# Patient Record
Sex: Male | Born: 1960 | Race: Black or African American | Hispanic: No | Marital: Single
Health system: Southern US, Community
[De-identification: ages and names within clinical notes are randomized; demographics above are authoritative.]

## PROBLEM LIST (undated history)

## (undated) DIAGNOSIS — K4090 Unilateral inguinal hernia, without obstruction or gangrene, not specified as recurrent: Secondary | ICD-10-CM

## (undated) DIAGNOSIS — Z21 Asymptomatic human immunodeficiency virus [HIV] infection status: Secondary | ICD-10-CM

## (undated) DIAGNOSIS — B20 Human immunodeficiency virus [HIV] disease: Secondary | ICD-10-CM

---

## 2010-03-12 ENCOUNTER — Emergency Department (HOSPITAL_COMMUNITY): Admission: EM | Admit: 2010-03-12 | Discharge: 2010-03-12 | Payer: Self-pay | Admitting: Emergency Medicine

## 2010-08-15 LAB — URINALYSIS, ROUTINE W REFLEX MICROSCOPIC
Glucose, UA: NEGATIVE mg/dL
Hgb urine dipstick: NEGATIVE
Specific Gravity, Urine: 1.016 (ref 1.005–1.030)
Urobilinogen, UA: 1 mg/dL (ref 0.0–1.0)

## 2010-08-15 LAB — BASIC METABOLIC PANEL
Calcium: 8.8 mg/dL (ref 8.4–10.5)
GFR calc Af Amer: >60 mL/min (ref >60)
GFR calc non Af Amer: >60 mL/min (ref >60)
Sodium: 135 mEq/L (ref 135–145)

## 2010-08-15 LAB — DIFFERENTIAL
Basophils Relative: 0 % (ref 0–1)
Lymphs Abs: 1.4 10*3/uL (ref 0.7–4.0)
Monocytes Relative: 6 % (ref 3–12)
Neutro Abs: 11.9 10*3/uL — ABNORMAL HIGH (ref 1.7–7.7)
Neutrophils Relative %: 83 % — ABNORMAL HIGH (ref 43–77)

## 2010-08-15 LAB — URINE MICROSCOPIC-ADD ON

## 2010-08-15 LAB — CBC
Hemoglobin: 15.9 g/dL (ref 13.0–17.0)
RBC: 5.36 MIL/uL (ref 4.22–5.81)
WBC: 14.4 10*3/uL — ABNORMAL HIGH (ref 4.0–10.5)

## 2010-08-15 LAB — RAPID URINE DRUG SCREEN, HOSP PERFORMED
Opiates: NOT DETECTED
Tetrahydrocannabinol: NOT DETECTED

## 2013-07-06 ENCOUNTER — Encounter (HOSPITAL_COMMUNITY): Payer: Self-pay | Admitting: Emergency Medicine

## 2013-07-06 ENCOUNTER — Emergency Department (HOSPITAL_COMMUNITY)
Admission: EM | Admit: 2013-07-06 | Discharge: 2013-07-06 | Disposition: A | Discharge status: Home / Self Care | Payer: Self-pay | Attending: Emergency Medicine | Admitting: Emergency Medicine

## 2013-07-06 DIAGNOSIS — N390 Urinary tract infection, site not specified: Secondary | ICD-10-CM | POA: Insufficient documentation

## 2013-07-06 DIAGNOSIS — R112 Nausea with vomiting, unspecified: Secondary | ICD-10-CM

## 2013-07-06 DIAGNOSIS — K5289 Other specified noninfective gastroenteritis and colitis: Secondary | ICD-10-CM | POA: Insufficient documentation

## 2013-07-06 DIAGNOSIS — F17200 Nicotine dependence, unspecified, uncomplicated: Secondary | ICD-10-CM | POA: Insufficient documentation

## 2013-07-06 DIAGNOSIS — R55 Syncope and collapse: Secondary | ICD-10-CM | POA: Insufficient documentation

## 2013-07-06 DIAGNOSIS — R197 Diarrhea, unspecified: Secondary | ICD-10-CM

## 2013-07-06 DIAGNOSIS — K529 Noninfective gastroenteritis and colitis, unspecified: Secondary | ICD-10-CM

## 2013-07-06 LAB — CBC WITH DIFFERENTIAL/PLATELET
BASOS ABS: 0 10*3/uL (ref 0.0–0.1)
Basophils Relative: 0 % (ref 0–1)
EOS ABS: 0.1 10*3/uL (ref 0.0–0.7)
EOS PCT: 1 % (ref 0–5)
HCT: 51.5 % (ref 39.0–52.0)
Hemoglobin: 17.7 g/dL — ABNORMAL HIGH (ref 13.0–17.0)
Lymphocytes Relative: 11 % — ABNORMAL LOW (ref 12–46)
Lymphs Abs: 0.9 10*3/uL (ref 0.7–4.0)
MCH: 30 pg (ref 26.0–34.0)
MCHC: 34.4 g/dL (ref 30.0–36.0)
MCV: 87.3 fL (ref 78.0–100.0)
Monocytes Absolute: 0.6 10*3/uL (ref 0.1–1.0)
Monocytes Relative: 7 % (ref 3–12)
NEUTROS PCT: 82 % — AB (ref 43–77)
Neutro Abs: 6.9 10*3/uL (ref 1.7–7.7)
PLATELETS: 302 10*3/uL (ref 150–400)
RBC: 5.9 MIL/uL — ABNORMAL HIGH (ref 4.22–5.81)
RDW: 13.1 % (ref 11.5–15.5)
WBC: 8.5 10*3/uL (ref 4.0–10.5)

## 2013-07-06 LAB — RAPID URINE DRUG SCREEN, HOSP PERFORMED
AMPHETAMINES: NOT DETECTED
BARBITURATES: NOT DETECTED
Benzodiazepines: NOT DETECTED
Cocaine: POSITIVE — AB
OPIATES: NOT DETECTED
Tetrahydrocannabinol: NOT DETECTED

## 2013-07-06 LAB — COMPREHENSIVE METABOLIC PANEL
ALBUMIN: 4.8 g/dL (ref 3.5–5.2)
ALT: 18 U/L (ref 0–53)
AST: 35 U/L (ref 0–37)
Alkaline Phosphatase: 106 U/L (ref 39–117)
BUN: 13 mg/dL (ref 6–23)
CALCIUM: 10.3 mg/dL (ref 8.4–10.5)
CO2: 21 meq/L (ref 19–32)
CREATININE: 1.43 mg/dL — AB (ref 0.50–1.35)
Chloride: 100 mEq/L (ref 96–112)
GFR calc Af Amer: 63 mL/min — ABNORMAL LOW (ref >90)
GFR, EST NON AFRICAN AMERICAN: 55 mL/min — AB (ref >90)
Glucose, Bld: 112 mg/dL — ABNORMAL HIGH (ref 70–99)
Potassium: 4.5 mEq/L (ref 3.7–5.3)
Sodium: 136 mEq/L — ABNORMAL LOW (ref 137–147)
TOTAL PROTEIN: 10 g/dL — AB (ref 6.0–8.3)
Total Bilirubin: 0.7 mg/dL (ref 0.3–1.2)

## 2013-07-06 LAB — URINALYSIS, ROUTINE W REFLEX MICROSCOPIC
Glucose, UA: NEGATIVE mg/dL
Hgb urine dipstick: NEGATIVE
KETONES UR: 15 mg/dL — AB
NITRITE: NEGATIVE
PROTEIN: 100 mg/dL — AB
Specific Gravity, Urine: 1.027 (ref 1.005–1.030)
UROBILINOGEN UA: 0.2 mg/dL (ref 0.0–1.0)
pH: 6 (ref 5.0–8.0)

## 2013-07-06 LAB — URINE MICROSCOPIC-ADD ON

## 2013-07-06 LAB — TROPONIN I: Troponin I: <0.3 ng/mL (ref <0.30)

## 2013-07-06 MED ORDER — ONDANSETRON HCL 8 MG PO TABS: 8.0000 mg | ORAL_TABLET | Freq: Three times a day (TID) | ORAL | Status: DC | PRN

## 2013-07-06 MED ORDER — SODIUM CHLORIDE 0.9 % IV BOLUS (SEPSIS)
1000.0000 mL | Freq: Once | INTRAVENOUS | Status: AC
  Administered 2013-07-06: 1000 mL via INTRAVENOUS

## 2013-07-06 MED ORDER — SODIUM CHLORIDE 0.9 % IV BOLUS (SEPSIS)
500.0000 mL | Freq: Once | INTRAVENOUS | Status: AC
  Administered 2013-07-06: 500 mL via INTRAVENOUS

## 2013-07-06 MED ORDER — CEFTRIAXONE SODIUM 250 MG IJ SOLR
250.0000 mg | Freq: Once | INTRAMUSCULAR | Status: AC
  Administered 2013-07-06: 250 mg via INTRAMUSCULAR
  Filled 2013-07-06: qty 250

## 2013-07-06 MED ORDER — AZITHROMYCIN 250 MG PO TABS
1000.0000 mg | ORAL_TABLET | Freq: Once | ORAL | Status: AC
  Administered 2013-07-06: 1000 mg via ORAL
  Filled 2013-07-06: qty 4

## 2013-07-06 MED ORDER — CEPHALEXIN 500 MG PO CAPS: 500.0000 mg | ORAL_CAPSULE | Freq: Four times a day (QID) | ORAL | Status: DC

## 2013-07-06 NOTE — ED Notes (Signed)
Writer notified pt that urine is needed for samples

## 2013-07-06 NOTE — ED Provider Notes (Addendum)
CSN: [id number]     Arrival date & time 07/06/13  0055 History   First MD Initiated Contact with Patient 07/06/13 0134     Chief Complaint  Patient presents with  . Emesis  . Diarrhea   (Consider location/radiation/quality/duration/timing/severity/associated sxs/prior Treatment) Patient is a 53 y.o. male presenting with vomiting and diarrhea. The history is provided by the patient and a relative.  Emesis Associated symptoms: diarrhea   Associated symptoms: no abdominal pain, no chills, no headaches and no sore throat   Diarrhea Associated symptoms: vomiting   Associated symptoms: no abdominal pain, no chills, no fever and no headaches   pt had c/o nvd illness for past 24 hrs.  States 4-5 episodes nv, emesis clear, not bloody or bilious. States numerous watery stools, not bloody or mucousy. Had gotten up at home tonight to go to kitchen for some water, felt faint/lightheaded when standing, fainting w brief loc in kitchen. Family denies seeing any seizure activity. Pt denies any preceding palpitations or sense of irregular heartbeat. No current or recent chest pain or discomfort of any sort. No sob, unusual doe or fatigue. Pt denies abd pain. No blood loss or melena. No dysuria or gu c/o. Denies injury w fall. No headache. No neck or back pain. No numbness/weakness. No known ill contacts, recent travel, or recent abx use. No new meds or change in meds, no recent abx. No recent travel or known ill contacts.      History reviewed. No pertinent past medical history. History reviewed. No pertinent past surgical history. No family history on file. History  Substance Use Topics  . Smoking status: Current Every Day Smoker  . Smokeless tobacco: Not on file  . Alcohol Use: Yes    Review of Systems  Constitutional: Negative for fever and chills.  HENT: Negative for sore throat.   Eyes: Negative for pain, redness and visual disturbance.  Respiratory: Negative for shortness of breath.    Cardiovascular: Negative for chest pain, palpitations and leg swelling.  Gastrointestinal: Positive for nausea, vomiting and diarrhea. Negative for abdominal pain.  Genitourinary: Negative for dysuria and flank pain.  Musculoskeletal: Negative for back pain and neck pain.  Skin: Negative for rash.  Neurological: Negative for weakness, numbness and headaches.  Hematological: Does not bruise/bleed easily.  Psychiatric/Behavioral: Negative for confusion.    Allergies  Review of patient's allergies indicates no known allergies.  Home Medications  No current outpatient prescriptions on file. BP 108/66  Pulse 66  Temp(Src) 98.1 F (36.7 C) (Oral)  Resp 16  SpO2 100% Physical Exam  Nursing note and vitals reviewed. Constitutional: He is oriented to person, place, and time. He appears well-developed and well-nourished. No distress.  HENT:  Head: Atraumatic.  Nose: Nose normal.  Mouth/Throat: Oropharynx is clear and moist.  No oral or tongue trauma.   Eyes: Conjunctivae are normal. Pupils are equal, round, and reactive to light. No scleral icterus.  Neck: Normal range of motion. Neck supple. No tracheal deviation present. No thyromegaly present.  No bruit  Cardiovascular: Normal rate, regular rhythm, normal heart sounds and intact distal pulses.  Exam reveals no gallop and no friction rub.   No murmur heard. Pulmonary/Chest: Effort normal and breath sounds normal. No accessory muscle usage. No respiratory distress.  Abdominal: Soft. Bowel sounds are normal. He exhibits no distension and no mass. There is no tenderness. There is no rebound and no guarding.  Musculoskeletal: Normal range of motion. He exhibits no edema and no tenderness.  CTLS  spine, non tender, aligned, no step off.   Neurological: He is alert and oriented to person, place, and time.  Motor intact bil, 5/5. sens intact.   Skin: Skin is warm and dry. No rash noted. He is not diaphoretic.  Psychiatric: He has a normal  mood and affect.    ED Course  Procedures (including critical care time)   Results for orders placed during the hospital encounter of 07/06/13  CBC WITH DIFFERENTIAL      Result Value Range   WBC 8.5  4.0 - 10.5 K/uL   RBC 5.90 (*) 4.22 - 5.81 MIL/uL   Hemoglobin 17.7 (*) 13.0 - 17.0 g/dL   HCT 51.5  39.0 - 52.0 %   MCV 87.3  78.0 - 100.0 fL   MCH 30.0  26.0 - 34.0 pg   MCHC 34.4  30.0 - 36.0 g/dL   RDW 13.1  11.5 - 15.5 %   Platelets 302  150 - 400 K/uL   Neutrophils Relative % 82 (*) 43 - 77 %   Neutro Abs 6.9  1.7 - 7.7 K/uL   Lymphocytes Relative 11 (*) 12 - 46 %   Lymphs Abs 0.9  0.7 - 4.0 K/uL   Monocytes Relative 7  3 - 12 %   Monocytes Absolute 0.6  0.1 - 1.0 K/uL   Eosinophils Relative 1  0 - 5 %   Eosinophils Absolute 0.1  0.0 - 0.7 K/uL   Basophils Relative 0  0 - 1 %   Basophils Absolute 0.0  0.0 - 0.1 K/uL  URINALYSIS, ROUTINE W REFLEX MICROSCOPIC      Result Value Range   Color, Urine AMBER (*) YELLOW   APPearance CLOUDY (*) CLEAR   Specific Gravity, Urine 1.027  1.005 - 1.030   pH 6.0  5.0 - 8.0   Glucose, UA NEGATIVE  NEGATIVE mg/dL   Hgb urine dipstick NEGATIVE  NEGATIVE   Bilirubin Urine MODERATE (*) NEGATIVE   Ketones, ur 15 (*) NEGATIVE mg/dL   Protein, ur 100 (*) NEGATIVE mg/dL   Urobilinogen, UA 0.2  0.0 - 1.0 mg/dL   Nitrite NEGATIVE  NEGATIVE   Leukocytes, UA MODERATE (*) NEGATIVE  COMPREHENSIVE METABOLIC PANEL      Result Value Range   Sodium 136 (*) 137 - 147 mEq/L   Potassium 4.5  3.7 - 5.3 mEq/L   Chloride 100  96 - 112 mEq/L   CO2 21  19 - 32 mEq/L   Glucose, Bld 112 (*) 70 - 99 mg/dL   BUN 13  6 - 23 mg/dL   Creatinine, Ser 1.43 (*) 0.50 - 1.35 mg/dL   Calcium 10.3  8.4 - 10.5 mg/dL   Total Protein 10.0 (*) 6.0 - 8.3 g/dL   Albumin 4.8  3.5 - 5.2 g/dL   AST 35  0 - 37 U/L   ALT 18  0 - 53 U/L   Alkaline Phosphatase 106  39 - 117 U/L   Total Bilirubin 0.7  0.3 - 1.2 mg/dL   GFR calc non Af Amer 55 (*) >90 mL/min   GFR calc Af  Amer 63 (*) >90 mL/min  TROPONIN I      Result Value Range   Troponin I <0.30  <0.30 ng/mL  URINE RAPID DRUG SCREEN (HOSP PERFORMED)      Result Value Range   Opiates NONE DETECTED  NONE DETECTED   Cocaine POSITIVE (*) NONE DETECTED   Benzodiazepines NONE DETECTED  NONE DETECTED  Amphetamines NONE DETECTED  NONE DETECTED   Tetrahydrocannabinol NONE DETECTED  NONE DETECTED   Barbiturates NONE DETECTED  NONE DETECTED  URINE MICROSCOPIC-ADD ON      Result Value Range   Squamous Epithelial / LPF FEW (*) RARE   WBC, UA 21-50  <3 WBC/hpf   Bacteria, UA RARE  RARE   Casts HYALINE CASTS (*) NEGATIVE   Urine-Other MUCOUS PRESENT         Date: 07/06/2013  Rate: 67  Rhythm: normal sinus rhythm  QRS Axis: right  Intervals: normal  ST/T Wave abnormalities: nonspecific ST/T changes  Conduction Disutrbances:none  Narrative Interpretation:   Old EKG Reviewed: none available   MDM  Iv ns bolus. zofran iv.  Labs.  Reviewed nursing notes and prior charts for additional history.   ua w 21-50 wbc.   Rocephin. Zithromax.  Urine culture. Will rx keflex at home.  Recheck abd soft nt.   Tolerating po  Fluids.  Recheck no recurrent nvd.  abd soft non tender on recheck.   Pt appears stable for d/c. Will plan recheck in 12-24 hrs if symptoms fail to improve/resolve.      Mirna Mires, MD 07/06/13 [phone]

## 2013-07-06 NOTE — ED Notes (Signed)
Per [facility] pt c/o n/v/d since 1pm yesterday, per family pt passed out but pt states he was asleep; pt was orthostatic prior to arrival.

## 2013-07-06 NOTE — ED Notes (Signed)
Pt c/o dizziness after walking to restroom.  Pt returned to bed by self.  Vitals checked.  MD notified.  No visible change in assessment.  Pt d/c home with instructions to drink plenty of fluid.

## 2013-07-06 NOTE — Discharge Instructions (Signed)
Rest. Drink plenty of fluids. Take zofran as need for nausea. The lab tests show a possible urine infection - take keflex (antibiotic) as prescribed. A urine culture was sent the results of which will be back in 2-3 days - follow up with primary care doctor in the next couple days, and have them follow up onurine culture results at that time.  Return to ER for recheck in 12-24 hours if symptoms fail to improve/resolve. Return to ER right away if worse, abdominal pain, persistent vomiting, other concern.  Do not use cocaine - cocaine use can cause heart disease/heart attacks, strokes, and sudden cardiac death.    Nausea and Vomiting Nausea is a sick feeling that often comes before throwing up (vomiting). Vomiting is a reflex where stomach contents come out of your mouth. Vomiting can cause severe loss of body fluids (dehydration). Children and elderly adults can become dehydrated quickly, especially if they also have diarrhea. Nausea and vomiting are symptoms of a condition or disease. It is important to find the cause of your symptoms. CAUSES   Direct irritation of the stomach lining. This irritation can result from increased acid production (gastroesophageal reflux disease), infection, food poisoning, taking certain medicines (such as nonsteroidal anti-inflammatory drugs), alcohol use, or tobacco use.  Signals from the brain.These signals could be caused by a headache, heat exposure, an inner ear disturbance, increased pressure in the brain from injury, infection, a tumor, or a concussion, pain, emotional stimulus, or metabolic problems.  An obstruction in the gastrointestinal tract (bowel obstruction).  Illnesses such as diabetes, hepatitis, gallbladder problems, appendicitis, kidney problems, cancer, sepsis, atypical symptoms of a heart attack, or eating disorders.  Medical treatments such as chemotherapy and radiation.  Receiving medicine that makes you sleep (general anesthetic) during  surgery. DIAGNOSIS Your caregiver may ask for tests to be done if the problems do not improve after a few days. Tests may also be done if symptoms are severe or if the reason for the nausea and vomiting is not clear. Tests may include:  Urine tests.  Blood tests.  Stool tests.  Cultures (to look for evidence of infection).  X-rays or other imaging studies. Test results can help your caregiver make decisions about treatment or the need for additional tests. TREATMENT You need to stay well hydrated. Drink frequently but in small amounts.You may wish to drink water, sports drinks, clear broth, or eat frozen ice pops or gelatin dessert to help stay hydrated.When you eat, eating slowly may help prevent nausea.There are also some antinausea medicines that may help prevent nausea. HOME CARE INSTRUCTIONS   Take all medicine as directed by your caregiver.  If you do not have an appetite, do not force yourself to eat. However, you must continue to drink fluids.  If you have an appetite, eat a normal diet unless your caregiver tells you differently.  Eat a variety of complex carbohydrates (rice, wheat, potatoes, bread), lean meats, yogurt, fruits, and vegetables.  Avoid high-fat foods because they are more difficult to digest.  Drink enough water and fluids to keep your urine clear or pale yellow.  If you are dehydrated, ask your caregiver for specific rehydration instructions. Signs of dehydration may include:  Severe thirst.  Dry lips and mouth.  Dizziness.  Dark urine.  Decreasing urine frequency and amount.  Confusion.  Rapid breathing or pulse. SEEK IMMEDIATE MEDICAL CARE IF:   You have blood or brown flecks (like coffee grounds) in your vomit.  You have black or bloody  stools.  You have a severe headache or stiff neck.  You are confused.  You have severe abdominal pain.  You have chest pain or trouble breathing.  You do not urinate at least once every 8  hours.  You develop cold or clammy skin.  You continue to vomit for longer than 24 to 48 hours.  You have a fever. MAKE SURE YOU:   Understand these instructions.  Will watch your condition.  Will get help right away if you are not doing well or get worse. Document Released: 05/19/2005 Document Revised: 08/11/2011 Document Reviewed: 10/16/2010 [facility] Patient Information 2014 [place], [place].  Dehydration, Adult Dehydration is when you lose more fluids from the body than you take in. Vital organs like the kidneys, brain, and heart cannot function without a proper amount of fluids and salt. Any loss of fluids from the body can cause dehydration.  CAUSES   Vomiting.  Diarrhea.  Excessive sweating.  Excessive urine output.  Fever. SYMPTOMS  Mild dehydration  Thirst.  Dry lips.  Slightly dry mouth. Moderate dehydration  Very dry mouth.  Sunken eyes.  Skin does not bounce back quickly when lightly pinched and released.  Dark urine and decreased urine production.  Decreased tear production.  Headache. Severe dehydration  Very dry mouth.  Extreme thirst.  Rapid, weak pulse (more than 100 beats per minute at rest).  Cold hands and feet.  Not able to sweat in spite of heat and temperature.  Rapid breathing.  Blue lips.  Confusion and lethargy.  Difficulty being awakened.  Minimal urine production.  No tears. DIAGNOSIS  Your caregiver will diagnose dehydration based on your symptoms and your exam. Blood and urine tests will help confirm the diagnosis. The diagnostic evaluation should also identify the cause of dehydration. TREATMENT  Treatment of mild or moderate dehydration can often be done at home by increasing the amount of fluids that you drink. It is best to drink small amounts of fluid more often. Drinking too much at one time can make vomiting worse. Refer to the home care instructions below. Severe dehydration needs to be treated at the  hospital where you will probably be given intravenous (IV) fluids that contain water and electrolytes. HOME CARE INSTRUCTIONS   Ask your caregiver about specific rehydration instructions.  Drink enough fluids to keep your urine clear or pale yellow.  Drink small amounts frequently if you have nausea and vomiting.  Eat as you normally do.  Avoid:  Foods or drinks high in sugar.  Carbonated drinks.  Juice.  Extremely hot or cold fluids.  Drinks with caffeine.  Fatty, greasy foods.  Alcohol.  Tobacco.  Overeating.  Gelatin desserts.  Wash your hands well to avoid spreading bacteria and viruses.  Only take over-the-counter or prescription medicines for pain, discomfort, or fever as directed by your caregiver.  Ask your caregiver if you should continue all prescribed and over-the-counter medicines.  Keep all follow-up appointments with your caregiver. SEEK MEDICAL CARE IF:  You have abdominal pain and it increases or stays in one area (localizes).  You have a rash, stiff neck, or severe headache.  You are irritable, sleepy, or difficult to awaken.  You are weak, dizzy, or extremely thirsty. SEEK IMMEDIATE MEDICAL CARE IF:   You are unable to keep fluids down or you get worse despite treatment.  You have frequent episodes of vomiting or diarrhea.  You have blood or green matter (bile) in your vomit.  You have blood in your stool  or your stool looks black and tarry.  You have not urinated in 6 to 8 hours, or you have only urinated a small amount of very dark urine.  You have a fever.  You faint. MAKE SURE YOU:   Understand these instructions.  Will watch your condition.  Will get help right away if you are not doing well or get worse. Document Released: 05/19/2005 Document Revised: 08/11/2011 Document Reviewed: 01/06/2011 [facility] Patient Information 2014 [place], [place].   Viral Gastroenteritis Viral gastroenteritis is also known as stomach flu.  This condition affects the stomach and intestinal tract. It can cause sudden diarrhea and vomiting. The illness typically lasts 3 to 8 days. Most people develop an immune response that eventually gets rid of the virus. While this natural response develops, the virus can make you quite ill. CAUSES  Many different viruses can cause gastroenteritis, such as rotavirus or noroviruses. You can catch one of these viruses by consuming contaminated food or water. You may also catch a virus by sharing utensils or other personal items with an infected person or by touching a contaminated surface. SYMPTOMS  The most common symptoms are diarrhea and vomiting. These problems can cause a severe loss of body fluids (dehydration) and a body salt (electrolyte) imbalance. Other symptoms may include:  Fever.  Headache.  Fatigue.  Abdominal pain. DIAGNOSIS  Your caregiver can usually diagnose viral gastroenteritis based on your symptoms and a physical exam. A stool sample may also be taken to test for the presence of viruses or other infections. TREATMENT  This illness typically goes away on its own. Treatments are aimed at rehydration. The most serious cases of viral gastroenteritis involve vomiting so severely that you are not able to keep fluids down. In these cases, fluids must be given through an intravenous line (IV). HOME CARE INSTRUCTIONS   Drink enough fluids to keep your urine clear or pale yellow. Drink small amounts of fluids frequently and increase the amounts as tolerated.  Ask your caregiver for specific rehydration instructions.  Avoid:  Foods high in sugar.  Alcohol.  Carbonated drinks.  Tobacco.  Juice.  Caffeine drinks.  Extremely hot or cold fluids.  Fatty, greasy foods.  Too much intake of anything at one time.  Dairy products until 24 to 48 hours after diarrhea stops.  You may consume probiotics. Probiotics are active cultures of beneficial bacteria. They may lessen the  amount and number of diarrheal stools in adults. Probiotics can be found in yogurt with active cultures and in supplements.  Wash your hands well to avoid spreading the virus.  Only take over-the-counter or prescription medicines for pain, discomfort, or fever as directed by your caregiver. Do not give aspirin to children. Antidiarrheal medicines are not recommended.  Ask your caregiver if you should continue to take your regular prescribed and over-the-counter medicines.  Keep all follow-up appointments as directed by your caregiver. SEEK IMMEDIATE MEDICAL CARE IF:   You are unable to keep fluids down.  You do not urinate at least once every 6 to 8 hours.  You develop shortness of breath.  You notice blood in your stool or vomit. This may look like coffee grounds.  You have abdominal pain that increases or is concentrated in one small area (localized).  You have persistent vomiting or diarrhea.  You have a fever.  The patient is a child younger than 3 months, and he or she has a fever.  The patient is a child older than 3 months, and  he or she has a fever and persistent symptoms.  The patient is a child older than 3 months, and he or she has a fever and symptoms suddenly get worse.  The patient is a baby, and he or she has no tears when crying. MAKE SURE YOU:   Understand these instructions.  Will watch your condition.  Will get help right away if you are not doing well or get worse. Document Released: 05/19/2005 Document Revised: 08/11/2011 Document Reviewed: 03/05/2011 [facility] Patient Information 2014 [place].   Substance Abuse Your exam indicates that you have a problem with substance abuse. Substance abuse is the misuse of alcohol or drugs that causes problems in family life, friendships, and work relationships. Substance abuse is the most important cause of premature illness, disability, and death in our society. It is also the greatest threat to a person's  mental and spiritual well being. Substance abuse can start out in an innocent way, such as social drinking or taking a little extra medication prescribed by your doctor. No one starts out with the intention of becoming an alcoholic or an addict. Substance abuse victims cannot control their use of alcohol or drugs. They may become intoxicated daily or go on weekend binges. Often there is a strong desire to quit, but attempts to stop using often fail. Encounters with law enforcement or conflicts with family members, friends, and work associates are signs of a potential problem. Recovery is always possible, although the craving for some drugs makes it difficult to quit without assistance. Many treatment programs are available to help people stop abusing alcohol or drugs. The first step in treatment is to admit you have a problem. This is a major hurdle because denial is a powerful force with substance abuse. Alcoholics Anonymous, Narcotics Anonymous, Cocaine Anonymous, and other recovery groups and programs can be very useful in helping people to quit. If you do not feel okay about your drug or alcohol use and if it is causing you trouble, we want to encourage you to talk about it with your doctor or with someone from a recovery group who can help you. You could also call the [employer] on Drug Abuse at 1-800-662-HELP. It is up to you to take the first step. [facility] and ALA-TEEN are support groups for friends and family members of an alcohol or drug dependent person. The people who love and care for the alcoholic or addicted person often need help, too. For information about these organizations, check your phone directory or call a local alcohol or drug treatment center. Document Released: 06/26/2004 Document Revised: 08/11/2011 Document Reviewed: 05/20/2008 [facility] Patient Information 2014 [place].    Urinary Tract Infection Urinary tract infections (UTIs) can develop anywhere along your  urinary tract. Your urinary tract is your body's drainage system for removing wastes and extra water. Your urinary tract includes two kidneys, two ureters, a bladder, and a urethra. Your kidneys are a pair of bean-shaped organs. Each kidney is about the size of your fist. They are located below your ribs, one on each side of your spine. CAUSES Infections are caused by microbes, which are microscopic organisms, including fungi, viruses, and bacteria. These organisms are so small that they can only be seen through a microscope. Bacteria are the microbes that most commonly cause UTIs. SYMPTOMS  Symptoms of UTIs may vary by age and gender of the patient and by the location of the infection. Symptoms in young women typically include a frequent and intense urge to  urinate and a painful, burning feeling in the bladder or urethra during urination. Older women and men are more likely to be tired, shaky, and weak and have muscle aches and abdominal pain. A fever may mean the infection is in your kidneys. Other symptoms of a kidney infection include pain in your back or sides below the ribs, nausea, and vomiting. DIAGNOSIS To diagnose a UTI, your caregiver will ask you about your symptoms. Your caregiver also will ask to provide a urine sample. The urine sample will be tested for bacteria and white blood cells. White blood cells are made by your body to help fight infection. TREATMENT  Typically, UTIs can be treated with medication. Because most UTIs are caused by a bacterial infection, they usually can be treated with the use of antibiotics. The choice of antibiotic and length of treatment depend on your symptoms and the type of bacteria causing your infection. HOME CARE INSTRUCTIONS  If you were prescribed antibiotics, take them exactly as your caregiver instructs you. Finish the medication even if you feel better after you have only taken some of the medication.  Drink enough water and fluids to keep your urine  clear or pale yellow.  Avoid caffeine, tea, and carbonated beverages. They tend to irritate your bladder.  Empty your bladder often. Avoid holding urine for long periods of time.  Empty your bladder before and after sexual intercourse.  After a bowel movement, women should cleanse from front to back. Use each tissue only once. SEEK MEDICAL CARE IF:   You have back pain.  You develop a fever.  Your symptoms do not begin to resolve within 3 days. SEEK IMMEDIATE MEDICAL CARE IF:   You have severe back pain or lower abdominal pain.  You develop chills.  You have nausea or vomiting.  You have continued burning or discomfort with urination. MAKE SURE YOU:   Understand these instructions.  Will watch your condition.  Will get help right away if you are not doing well or get worse. Document Released: 02/26/2005 Document Revised: 11/18/2011 Document Reviewed: 06/27/2011 [facility] Patient Information 2014 [place].  Syncope Syncope is a fainting spell. This means the person loses consciousness and drops to the ground. The person is generally unconscious for less than 5 minutes. The person may have some muscle twitches for up to 15 seconds before waking up and returning to normal. Syncope occurs more often in elderly people, but it can happen to anyone. While most causes of syncope are not dangerous, syncope can be a sign of a serious medical problem. It is important to seek medical care.  CAUSES  Syncope is caused by a sudden decrease in blood flow to the brain. The specific cause is often not determined. Factors that can trigger syncope include:  Taking medicines that lower blood pressure.  Sudden changes in posture, such as standing up suddenly.  Taking more medicine than prescribed.  Standing in one place for too long.  Seizure disorders.  Dehydration and excessive exposure to heat.  Low blood sugar (hypoglycemia).  Straining to have a bowel movement.  Heart  disease, irregular heartbeat, or other circulatory problems.  Fear, emotional distress, seeing blood, or severe pain. SYMPTOMS  Right before fainting, you may:  Feel dizzy or lightheaded.  Feel nauseous.  See all white or all black in your field of vision.  Have cold, clammy skin. DIAGNOSIS  Your caregiver will ask about your symptoms, perform a physical exam, and perform electrocardiography (ECG) to record the  electrical activity of your heart. Your caregiver may also perform other heart or blood tests to determine the cause of your syncope. TREATMENT  In most cases, no treatment is needed. Depending on the cause of your syncope, your caregiver may recommend changing or stopping some of your medicines. HOME CARE INSTRUCTIONS  Have someone stay with you until you feel stable.  Do not drive, operate machinery, or play sports until your caregiver says it is okay.  Keep all follow-up appointments as directed by your caregiver.  Lie down right away if you start feeling like you might faint. Breathe deeply and steadily. Wait until all the symptoms have passed.  Drink enough fluids to keep your urine clear or pale yellow.  If you are taking blood pressure or heart medicine, get up slowly, taking several minutes to sit and then stand. This can reduce dizziness. SEEK IMMEDIATE MEDICAL CARE IF:   You have a severe headache.  You have unusual pain in the chest, abdomen, or back.  You are bleeding from the mouth or rectum, or you have black or tarry stool.  You have an irregular or very fast heartbeat.  You have pain with breathing.  You have repeated fainting or seizure-like jerking during an episode.  You faint when sitting or lying down.  You have confusion.  You have difficulty walking.  You have severe weakness.  You have vision problems. If you fainted, call your local emergency services (911 in [place] Do not drive yourself to the hospital.  MAKE SURE  YOU:  Understand these instructions.  Will watch your condition.  Will get help right away if you are not doing well or get worse. Document Released: 05/19/2005 Document Revised: 11/18/2011 Document Reviewed: 07/18/2011 [facility] Patient Information 2014 [place].    Emergency Department Resource Guide 1) Find a Doctor and Pay Out of Pocket Although you won't have to find out who is covered by your insurance plan, it is a good idea to ask around and get recommendations. You will then need to call the office and see if the doctor you have chosen will accept you as a new patient and what types of options they offer for patients who are self-pay. Some doctors offer discounts or will set up payment plans for their patients who do not have insurance, but you will need to ask so you aren't surprised when you get to your appointment.  2) Contact Your Local Health Department Not all health departments have doctors that can see patients for sick visits, but many do, so it is worth a call to see if yours does. If you don't know where your local health department is, you can check in your phone book. The CDC also has a tool to help you locate your state's health department, and many state websites also have listings of all of their local health departments.  3) Find a [facility] If your illness is not likely to be very severe or complicated, you may want to try a walk in clinic. These are popping up all over the country in pharmacies, drugstores, and shopping centers. They're usually staffed by nurse practitioners or physician assistants that have been trained to treat common illnesses and complaints. They're usually fairly quick and inexpensive. However, if you have serious medical issues or chronic medical problems, these are probably not your best option.  No Primary Care Doctor: - Call Health Connect at  [phone] - they can help you locate a primary care doctor that  accepts your insurance,  provides certain services, etc. - Physician Referral Service- [phone]  Chronic Pain Problems: Organization         Address  Phone   Notes  [facility]  [phone] Patients need to be referred by their primary care doctor.   Medication Assistance: Organization         Address  Phone   Notes  [facility] Medication [facility] [address] [place], [place] [address] [phone] --Must be a resident of [facility] -- Must have NO insurance coverage whatsoever (no Medicaid/ Medicare, etc.) -- The pt. MUST have a primary care doctor that directs their care regularly and follows them in the community   [facility]  [phone]   [employer]  [phone]    Agencies that provide inexpensive medical care: Organization         Address  Phone   Notes  [place]  [phone]   [facility]    [phone]   [facility] [place], [place] [address] [phone]   [place] [address] [place] [phone]   [facility]    [phone]   [facility]    [phone]   [place] and [place] [address], [place] Phone:  [phone], Fax:  [phone] Hours of Operation:  9 am - 6 pm, M-F.  Also accepts Medicaid/Medicare and self-pay.  [facility] for [place] [address], [address], [place] Phone: [phone], Fax: [phone]. Hours of Operation:  8:30 am - 5:30 pm, M-F.  Also accepts Medicaid and self-pay.  [facility] [place] [address], [place] Phone: [phone]   [place], [place], [place] [phone], Ext. [phone] Mondays & Thursdays: 7-9 AM.  First 15 patients are seen on a first come, first serve basis.    [place] Providers:  Organization         Address  Phone    Notes  [facility] [address], [place] [phone] Also accepts self-pay patients.  [facility] [address], [place]  [phone]   [place], [address], [place] [phone]   [facility] [address] [place] [phone]   Lucianne Lei [address], [place]   [phone] Only accepts [place] patients after they have their name applied to their card.   Self-Pay (no insurance) in [facility]:  Organization         Address  Phone   Notes  Sickle Cell Patients, [facility] [place] [phone]   [facility] [place] [phone]   [facility] [place]  [place], [place], [place] [phone]   [facility]/Dr. Osei-Bonsu  [address] [place] or [address], [place] [phone] Phone number for both [place] and [place] locations is the same.  [facility] [address], [place] [phone]   [facility] [address] [place] or [address] [phone]   [facility] [phone], phone; [phone], fax Sees patients 1st and 3rd Saturday of every month.  Must not qualify for public or private insurance (i.e. Medicaid, Medicare, [place] Health Choice, Veterans' Benefits)  Household income should be no more than 200% of the poverty level The clinic cannot treat you if you are pregnant or think you are pregnant  Sexually transmitted diseases are not treated at the clinic.    Dental Care: Organization         Address  Phone  Notes  [facility] Department of [facility] [place] [phone] Accepts children up to age 15 who are enrolled in [place] or [place]; pregnant women with a Medicaid card; and children who have applied for Medicaid or [place] Health Choice, but were declined, whose parents can pay a reduced fee at time of service.  [facility] Department of [facility]  [address], [place] [phone] Accepts children up to age 68 who are enrolled in [place] or [place]; pregnant women with a Medicaid card; and children who have applied for Medicaid or [place] Health Choice, but were declined, whose parents can pay a reduced fee at time of service.  [place] [facility]  [place] [phone] Patients are seen by appointment only. Walk-ins are not accepted. [place] will see patients 42 years of age and older. Monday - Tuesday (8am-5pm) Most Wednesdays (8:30-5pm) $30 per visit, cash only  [facility] Adult Dental Access PROGRAM  [address], [facility] [phone] Patients are seen by appointment only. Walk-ins are not accepted. [place] will see patients 71 years of age and older. One Wednesday Evening (Monthly: Volunteer Based).  $30 per visit, cash only  [place]  [phone] for adults; Children under age 77, call Graduate Pediatric Dentistry at [phone]. Children aged 39-14, please call [phone] to request a pediatric application.  Dental services are provided in all areas of dental care including fillings, crowns and bridges, complete and partial dentures, implants, gum treatment, root canals, and extractions. Preventive care is also provided. Treatment is provided to both adults and children. Patients are selected via a lottery and there is often a waiting list.   [facility] [address], [place]  [phone] [email or web address]   [facility] [address] [place], [place] [phone], Ext.  123 Second and Fourth Thursday of each month, opens at 6:30 AM; Clinic ends at 9 AM.  Patients are seen on a first-come first-served basis, and a limited number are seen during each clinic.   [facility]  [address] Hillard Danker [place], [place] [phone]   Eligibility Requirements You must have lived in [place], [place], or [place] counties for at least the last three months.   You cannot be eligible for state or federal sponsored [employer], including [employer], [place], or [employer].   You generally cannot be eligible for healthcare insurance through your employer.    How to apply: Eligibility screenings are held every Tuesday and Wednesday afternoon from 1:00 pm until 4:00 pm. You do not need an appointment for the interview!  [facility] [address], [place], [place]   [place]  [place] Department  [place] Department  [place] in the Community: Intensive Outpatient Programs Organization         Address  Phone  Notes  [place]. [address] [place], [place] [phone]   [facility] [address] [place], [place]   [facility] [address] [place], [place]   [place] [address]  [place], [place] or [phone]   Substance Abuse Resources Organization         Address  Phone  Notes  [facility] Services  [phone]   [place]  [phone]   [facility] [place]   Chinita Pester  [phone]   Residential & Outpatient Substance Abuse Program  [phone]   Psychological Services Organization         Address  Phone  Notes  [facility] [place]  [place]  [phone]   [place] [address], [place] or [phone]    Mobile Crisis Teams Organization         Address  Phone  Notes  [facility], Mobile Crisis Care Unit  [phone]   [facility]  [address] [place], [place]   [place] [address] [place] [phone]    Self-Help/Support Groups Organization         Address  Phone             Notes  [place]. of [place] - variety of support groups  [place] Call for more information  Narcotics Anonymous (NA), [facility] [address], [employer] [place]  2 meetings at this location   Special educational needs teacher         Address  Phone  Notes  [facility] [place],    [place]  [phone]   [facility]  [address], [place] [address], [place], [place]   [place], [place] [phone] Admissions: 8am-3pm M-F  Incentives Substance [place] [address]    [place], [place] [phone]   [facility] [address] [place], [place], [place]   [facility] [address]  [place], [place]   [facility] - Intensive Outpatient [address], [place], [place]   [facility] ([place] [place].,  [place], [place] [phone] or [phone]   [facility] ([facility]) [address] [place], [place] Accepts Medicaid  [facility] [place] [address],  [place] [phone] Substance Abuse/Addiction Treatment   [facility] Organization         Address  Phone  Notes  [facility]  [phone]   Domenic Schwab, PhD [address] Arlis Porta [place], [place]   [phone] or [phone]   [place], [place] [phone]   [place] [address], [place], [place] [phone] Insurance/Medicaid/sponsorship through [employer] and Families [address], [place]                                    [place], [place] [phone] [place] [phone]  [address], [place] [phone]    Dr. Adele Schilder  [phone]   [facility] Dept. 1) [address] [place] 2) [place] 3)  [place] [address], [place] [phone]  [phone]   [place] [phone] or [phone] (After Hours)

## 2013-07-06 NOTE — ED Notes (Signed)
Bed: WA07 Expected date:  Expected time:  Means of arrival:  Comments: EMS/53 yo male with nausea and vomiting since 1300

## 2013-07-07 LAB — URINE CULTURE: Colony Count: 40000

## 2018-02-17 ENCOUNTER — Observation Stay (HOSPITAL_COMMUNITY): Payer: Self-pay | Admitting: Anesthesiology

## 2018-02-17 ENCOUNTER — Encounter (HOSPITAL_COMMUNITY)
Admission: EM | Disposition: A | Discharge status: Home / Self Care | Payer: Self-pay | Source: Home / Self Care | Attending: Emergency Medicine

## 2018-02-17 ENCOUNTER — Encounter (HOSPITAL_COMMUNITY): Payer: Self-pay

## 2018-02-17 ENCOUNTER — Emergency Department (HOSPITAL_COMMUNITY): Payer: Self-pay

## 2018-02-17 ENCOUNTER — Observation Stay (HOSPITAL_COMMUNITY)
Admission: EM | Admit: 2018-02-17 | Discharge: 2018-02-18 | Disposition: A | Discharge status: Home / Self Care | Payer: Self-pay | Attending: Surgery | Admitting: Surgery

## 2018-02-17 DIAGNOSIS — Z79899 Other long term (current) drug therapy: Secondary | ICD-10-CM | POA: Insufficient documentation

## 2018-02-17 DIAGNOSIS — K4090 Unilateral inguinal hernia, without obstruction or gangrene, not specified as recurrent: Secondary | ICD-10-CM

## 2018-02-17 DIAGNOSIS — K4030 Unilateral inguinal hernia, with obstruction, without gangrene, not specified as recurrent: Principal | ICD-10-CM | POA: Insufficient documentation

## 2018-02-17 DIAGNOSIS — F17210 Nicotine dependence, cigarettes, uncomplicated: Secondary | ICD-10-CM | POA: Insufficient documentation

## 2018-02-17 HISTORY — DX: Unilateral inguinal hernia, without obstruction or gangrene, not specified as recurrent: K40.90

## 2018-02-17 HISTORY — PX: INGUINAL HERNIA REPAIR: SHX194

## 2018-02-17 LAB — TYPE AND SCREEN
ABO/RH(D): O POS
Antibody Screen: NEGATIVE

## 2018-02-17 LAB — CBC WITH DIFFERENTIAL/PLATELET
BLASTS: 0 %
Band Neutrophils: 0 %
Basophils Absolute: 0.1 10*3/uL (ref 0.0–0.1)
Basophils Relative: 1 %
EOS ABS: 0.1 10*3/uL (ref 0.0–0.7)
Eosinophils Relative: 1 %
HEMATOCRIT: 46.8 % (ref 39.0–52.0)
Hemoglobin: 14.7 g/dL (ref 13.0–17.0)
LYMPHS ABS: 3 10*3/uL (ref 0.7–4.0)
Lymphocytes Relative: 41 %
MCH: 27.3 pg (ref 26.0–34.0)
MCHC: 31.4 g/dL (ref 30.0–36.0)
MCV: 87 fL (ref 78.0–100.0)
METAMYELOCYTES PCT: 0 %
MYELOCYTES: 0 %
Monocytes Absolute: 0.9 10*3/uL (ref 0.1–1.0)
Monocytes Relative: 12 %
Neutro Abs: 3.1 10*3/uL (ref 1.7–7.7)
Neutrophils Relative %: 45 %
Other: 0 %
PLATELETS: 302 10*3/uL (ref 150–400)
Promyelocytes Relative: 0 %
RBC: 5.38 MIL/uL (ref 4.22–5.81)
RDW: 13.4 % (ref 11.5–15.5)
WBC: 7.2 10*3/uL (ref 4.0–10.5)
nRBC: 0 /100 WBC

## 2018-02-17 LAB — COMPREHENSIVE METABOLIC PANEL
ALBUMIN: 3.6 g/dL (ref 3.5–5.0)
ALT: 11 U/L (ref 0–44)
AST: 22 U/L (ref 15–41)
Alkaline Phosphatase: 123 U/L (ref 38–126)
Anion gap: 9 (ref 5–15)
BILIRUBIN TOTAL: 0.8 mg/dL (ref 0.3–1.2)
BUN: 11 mg/dL (ref 6–20)
CO2: 25 mmol/L (ref 22–32)
Calcium: 9 mg/dL (ref 8.9–10.3)
Chloride: 104 mmol/L (ref 98–111)
Creatinine, Ser: 1.24 mg/dL (ref 0.61–1.24)
GFR calc Af Amer: >60 mL/min (ref >60)
GFR calc non Af Amer: >60 mL/min (ref >60)
Glucose, Bld: 126 mg/dL — ABNORMAL HIGH (ref 70–99)
Potassium: 3.8 mmol/L (ref 3.5–5.1)
SODIUM: 138 mmol/L (ref 135–145)
Total Protein: 7.9 g/dL (ref 6.5–8.1)

## 2018-02-17 LAB — PROTIME-INR
INR: 1.07
Prothrombin Time: 13.8 seconds (ref 11.4–15.2)

## 2018-02-17 LAB — I-STAT CHEM 8, ED
BUN: 15 mg/dL (ref 6–20)
CALCIUM ION: 1.1 mmol/L — AB (ref 1.15–1.40)
Chloride: 103 mmol/L (ref 98–111)
Creatinine, Ser: 1.3 mg/dL — ABNORMAL HIGH (ref 0.61–1.24)
Glucose, Bld: 122 mg/dL — ABNORMAL HIGH (ref 70–99)
HCT: 47 % (ref 39.0–52.0)
Hemoglobin: 16 g/dL (ref 13.0–17.0)
POTASSIUM: 3.8 mmol/L (ref 3.5–5.1)
Sodium: 139 mmol/L (ref 135–145)
TCO2: 27 mmol/L (ref 22–32)

## 2018-02-17 LAB — APTT: aPTT: 30 seconds (ref 24–36)

## 2018-02-17 LAB — ABO/RH: ABO/RH(D): O POS

## 2018-02-17 LAB — LIPASE, BLOOD: Lipase: 23 U/L (ref 11–51)

## 2018-02-17 LAB — I-STAT CG4 LACTIC ACID, ED: Lactic Acid, Venous: 1.39 mmol/L (ref 0.5–1.9)

## 2018-02-17 SURGERY — REPAIR, HERNIA, INGUINAL, INCARCERATED
Anesthesia: General | Site: Groin | Laterality: Right

## 2018-02-17 MED ORDER — LACTATED RINGERS IV SOLN
INTRAVENOUS | Status: DC
  Administered 2018-02-17: 21:00:00 via INTRAVENOUS

## 2018-02-17 MED ORDER — OXYCODONE HCL 5 MG PO TABS: 5.0000 mg | ORAL_TABLET | Freq: Once | ORAL | Status: DC | PRN

## 2018-02-17 MED ORDER — LORAZEPAM 1 MG PO TABS: 1.0000 mg | ORAL_TABLET | Freq: Four times a day (QID) | ORAL | Status: DC | PRN

## 2018-02-17 MED ORDER — 0.9 % SODIUM CHLORIDE (POUR BTL) OPTIME
TOPICAL | Status: DC | PRN
  Administered 2018-02-17: 1000 mL

## 2018-02-17 MED ORDER — ONDANSETRON 4 MG PO TBDP: 4.0000 mg | ORAL_TABLET | Freq: Four times a day (QID) | ORAL | Status: DC | PRN

## 2018-02-17 MED ORDER — ROCURONIUM BROMIDE 100 MG/10ML IV SOLN
INTRAVENOUS | Status: DC | PRN
  Administered 2018-02-17: 50 mg via INTRAVENOUS

## 2018-02-17 MED ORDER — LORAZEPAM 2 MG/ML IJ SOLN: 1.0000 mg | Freq: Four times a day (QID) | INTRAMUSCULAR | Status: DC | PRN

## 2018-02-17 MED ORDER — IOHEXOL 300 MG/ML  SOLN
100.0000 mL | Freq: Once | INTRAMUSCULAR | Status: AC | PRN
  Administered 2018-02-17: 100 mL via INTRAVENOUS

## 2018-02-17 MED ORDER — GABAPENTIN 300 MG PO CAPS
300.0000 mg | ORAL_CAPSULE | Freq: Two times a day (BID) | ORAL | Status: DC
  Administered 2018-02-17 – 2018-02-18 (×2): 300 mg via ORAL
  Filled 2018-02-17 (×2): qty 1

## 2018-02-17 MED ORDER — HYDRALAZINE HCL 20 MG/ML IJ SOLN: 10.0000 mg | INTRAMUSCULAR | Status: DC | PRN

## 2018-02-17 MED ORDER — BUPIVACAINE-EPINEPHRINE 0.25% -1:200000 IJ SOLN
INTRAMUSCULAR | Status: DC | PRN
  Administered 2018-02-17: 26 mL

## 2018-02-17 MED ORDER — ONDANSETRON HCL 4 MG/2ML IJ SOLN
INTRAMUSCULAR | Status: DC | PRN
  Administered 2018-02-17: 4 mg via INTRAVENOUS

## 2018-02-17 MED ORDER — BUPIVACAINE-EPINEPHRINE (PF) 0.25% -1:200000 IJ SOLN
INTRAMUSCULAR | Status: AC
  Filled 2018-02-17: qty 30

## 2018-02-17 MED ORDER — FENTANYL CITRATE (PF) 250 MCG/5ML IJ SOLN
INTRAMUSCULAR | Status: AC
  Filled 2018-02-17: qty 5

## 2018-02-17 MED ORDER — MIDAZOLAM HCL 2 MG/2ML IJ SOLN
INTRAMUSCULAR | Status: AC
  Filled 2018-02-17: qty 2

## 2018-02-17 MED ORDER — MIDAZOLAM HCL 5 MG/5ML IJ SOLN
INTRAMUSCULAR | Status: DC | PRN
  Administered 2018-02-17: 2 mg via INTRAVENOUS

## 2018-02-17 MED ORDER — LACTATED RINGERS IV SOLN
INTRAVENOUS | Status: DC
  Administered 2018-02-17 (×3): via INTRAVENOUS

## 2018-02-17 MED ORDER — ONDANSETRON HCL 4 MG/2ML IJ SOLN: 4.0000 mg | Freq: Once | INTRAMUSCULAR | Status: DC | PRN

## 2018-02-17 MED ORDER — VITAMIN B-1 100 MG PO TABS
100.0000 mg | ORAL_TABLET | Freq: Every day | ORAL | Status: DC
  Administered 2018-02-18: 100 mg via ORAL
  Filled 2018-02-17: qty 1

## 2018-02-17 MED ORDER — ACETAMINOPHEN 500 MG PO TABS
1000.0000 mg | ORAL_TABLET | Freq: Four times a day (QID) | ORAL | Status: DC
  Administered 2018-02-17 – 2018-02-18 (×3): 1000 mg via ORAL
  Filled 2018-02-17 (×3): qty 2

## 2018-02-17 MED ORDER — PHENYLEPHRINE HCL 10 MG/ML IJ SOLN
INTRAMUSCULAR | Status: DC | PRN
  Administered 2018-02-17: 80 ug via INTRAVENOUS
  Administered 2018-02-17: 40 ug via INTRAVENOUS

## 2018-02-17 MED ORDER — PHENYLEPHRINE 40 MCG/ML (10ML) SYRINGE FOR IV PUSH (FOR BLOOD PRESSURE SUPPORT)
PREFILLED_SYRINGE | INTRAVENOUS | Status: AC
  Filled 2018-02-17: qty 10

## 2018-02-17 MED ORDER — MEPERIDINE HCL 50 MG/ML IJ SOLN: 6.2500 mg | INTRAMUSCULAR | Status: DC | PRN

## 2018-02-17 MED ORDER — LORAZEPAM 2 MG/ML IJ SOLN: 0.0000 mg | Freq: Four times a day (QID) | INTRAMUSCULAR | Status: DC

## 2018-02-17 MED ORDER — TRAMADOL HCL 50 MG PO TABS: 50.0000 mg | ORAL_TABLET | Freq: Four times a day (QID) | ORAL | Status: DC | PRN

## 2018-02-17 MED ORDER — ONDANSETRON HCL 4 MG/2ML IJ SOLN: 4.0000 mg | Freq: Four times a day (QID) | INTRAMUSCULAR | Status: DC | PRN

## 2018-02-17 MED ORDER — SUGAMMADEX SODIUM 200 MG/2ML IV SOLN
INTRAVENOUS | Status: DC | PRN
  Administered 2018-02-17: 200 mg via INTRAVENOUS

## 2018-02-17 MED ORDER — LIDOCAINE 2% (20 MG/ML) 5 ML SYRINGE
INTRAMUSCULAR | Status: AC
  Filled 2018-02-17: qty 5

## 2018-02-17 MED ORDER — OXYCODONE HCL 5 MG/5ML PO SOLN: 5.0000 mg | Freq: Once | ORAL | Status: DC | PRN

## 2018-02-17 MED ORDER — PROPOFOL 10 MG/ML IV BOLUS
INTRAVENOUS | Status: AC
  Filled 2018-02-17: qty 20

## 2018-02-17 MED ORDER — IBUPROFEN 600 MG PO TABS: 600.0000 mg | ORAL_TABLET | Freq: Four times a day (QID) | ORAL | Status: DC | PRN

## 2018-02-17 MED ORDER — ONDANSETRON HCL 4 MG/2ML IJ SOLN
4.0000 mg | Freq: Once | INTRAMUSCULAR | Status: AC
  Administered 2018-02-17: 4 mg via INTRAVENOUS
  Filled 2018-02-17: qty 2

## 2018-02-17 MED ORDER — ACETAMINOPHEN 325 MG PO TABS: 325.0000 mg | ORAL_TABLET | ORAL | Status: DC | PRN

## 2018-02-17 MED ORDER — DIPHENHYDRAMINE HCL 12.5 MG/5ML PO ELIX: 12.5000 mg | ORAL_SOLUTION | Freq: Four times a day (QID) | ORAL | Status: DC | PRN

## 2018-02-17 MED ORDER — FOLIC ACID 1 MG PO TABS
1.0000 mg | ORAL_TABLET | Freq: Every day | ORAL | Status: DC
  Administered 2018-02-18: 1 mg via ORAL
  Filled 2018-02-17: qty 1

## 2018-02-17 MED ORDER — DIPHENHYDRAMINE HCL 50 MG/ML IJ SOLN: 12.5000 mg | Freq: Four times a day (QID) | INTRAMUSCULAR | Status: DC | PRN

## 2018-02-17 MED ORDER — FENTANYL CITRATE (PF) 100 MCG/2ML IJ SOLN
INTRAMUSCULAR | Status: AC
  Filled 2018-02-17: qty 2

## 2018-02-17 MED ORDER — HEPARIN SODIUM (PORCINE) 5000 UNIT/ML IJ SOLN
5000.0000 [IU] | Freq: Three times a day (TID) | INTRAMUSCULAR | Status: DC
  Administered 2018-02-18: 5000 [IU] via SUBCUTANEOUS
  Filled 2018-02-17: qty 1

## 2018-02-17 MED ORDER — THIAMINE HCL 100 MG/ML IJ SOLN: 100.0000 mg | Freq: Every day | INTRAMUSCULAR | Status: DC

## 2018-02-17 MED ORDER — ADULT MULTIVITAMIN W/MINERALS CH
1.0000 | ORAL_TABLET | Freq: Every day | ORAL | Status: DC
  Administered 2018-02-18: 1 via ORAL
  Filled 2018-02-17: qty 1

## 2018-02-17 MED ORDER — HYDROMORPHONE HCL 1 MG/ML IJ SOLN: 1.0000 mg | INTRAMUSCULAR | Status: DC | PRN

## 2018-02-17 MED ORDER — ONDANSETRON HCL 4 MG/2ML IJ SOLN
INTRAMUSCULAR | Status: AC
  Filled 2018-02-17: qty 4

## 2018-02-17 MED ORDER — FENTANYL CITRATE (PF) 100 MCG/2ML IJ SOLN: 25.0000 ug | INTRAMUSCULAR | Status: DC | PRN

## 2018-02-17 MED ORDER — ROCURONIUM BROMIDE 50 MG/5ML IV SOSY
PREFILLED_SYRINGE | INTRAVENOUS | Status: AC
  Filled 2018-02-17: qty 10

## 2018-02-17 MED ORDER — SODIUM CHLORIDE 0.9 % IV SOLN: INTRAVENOUS | Status: DC

## 2018-02-17 MED ORDER — DOCUSATE SODIUM 100 MG PO CAPS
200.0000 mg | ORAL_CAPSULE | Freq: Two times a day (BID) | ORAL | Status: DC
  Administered 2018-02-17 – 2018-02-18 (×2): 200 mg via ORAL
  Filled 2018-02-17 (×3): qty 2

## 2018-02-17 MED ORDER — LORAZEPAM 2 MG/ML IJ SOLN: 0.0000 mg | Freq: Two times a day (BID) | INTRAMUSCULAR | Status: DC

## 2018-02-17 MED ORDER — HYDROMORPHONE HCL 1 MG/ML IJ SOLN
1.0000 mg | Freq: Once | INTRAMUSCULAR | Status: AC
  Administered 2018-02-17: 1 mg via INTRAVENOUS
  Filled 2018-02-17: qty 1

## 2018-02-17 MED ORDER — SIMETHICONE 80 MG PO CHEW: 40.0000 mg | CHEWABLE_TABLET | Freq: Four times a day (QID) | ORAL | Status: DC | PRN

## 2018-02-17 MED ORDER — PROPOFOL 10 MG/ML IV BOLUS
INTRAVENOUS | Status: DC | PRN
  Administered 2018-02-17: 100 mg via INTRAVENOUS

## 2018-02-17 MED ORDER — LIDOCAINE HCL (CARDIAC) PF 100 MG/5ML IV SOSY
PREFILLED_SYRINGE | INTRAVENOUS | Status: DC | PRN
  Administered 2018-02-17: 100 mg via INTRAVENOUS

## 2018-02-17 MED ORDER — MORPHINE SULFATE (PF) 4 MG/ML IV SOLN
4.0000 mg | Freq: Once | INTRAVENOUS | Status: AC
  Administered 2018-02-17: 4 mg via INTRAVENOUS
  Filled 2018-02-17: qty 1

## 2018-02-17 MED ORDER — ACETAMINOPHEN 160 MG/5ML PO SOLN: 325.0000 mg | ORAL | Status: DC | PRN

## 2018-02-17 MED ORDER — HYDROMORPHONE HCL 1 MG/ML IJ SOLN: 0.5000 mg | INTRAMUSCULAR | Status: DC | PRN

## 2018-02-17 MED ORDER — FENTANYL CITRATE (PF) 100 MCG/2ML IJ SOLN
INTRAMUSCULAR | Status: DC | PRN
  Administered 2018-02-17: 50 ug via INTRAVENOUS
  Administered 2018-02-17: 100 ug via INTRAVENOUS

## 2018-02-17 MED ORDER — HYDROMORPHONE HCL 1 MG/ML IJ SOLN
0.5000 mg | Freq: Once | INTRAMUSCULAR | Status: AC
  Administered 2018-02-17: 0.5 mg via INTRAVENOUS
  Filled 2018-02-17: qty 1

## 2018-02-17 MED ORDER — CEFOTETAN DISODIUM 1 G IJ SOLR
1.0000 g | INTRAMUSCULAR | Status: AC
  Administered 2018-02-17: 1 g via INTRAVENOUS
  Filled 2018-02-17 (×3): qty 1

## 2018-02-17 SURGICAL SUPPLY — 53 items
BLADE CLIPPER SURG (BLADE) IMPLANT
BLADE SURG 10 STRL SS (BLADE) ×2 IMPLANT
BLADE SURG 15 STRL LF DISP TIS (BLADE) ×1 IMPLANT
BLADE SURG 15 STRL SS (BLADE) ×1
CANISTER SUCT 3000ML PPV (MISCELLANEOUS) IMPLANT
CHLORAPREP W/TINT 26ML (MISCELLANEOUS) ×2 IMPLANT
COVER SURGICAL LIGHT HANDLE (MISCELLANEOUS) ×2 IMPLANT
DERMABOND ADVANCED (GAUZE/BANDAGES/DRESSINGS) ×1
DERMABOND ADVANCED .7 DNX12 (GAUZE/BANDAGES/DRESSINGS) ×1 IMPLANT
DRAIN PENROSE 1/2X12 LTX STRL (WOUND CARE) ×2 IMPLANT
DRAPE LAPAROTOMY TRNSV 102X78 (DRAPE) ×2 IMPLANT
DRAPE UTILITY XL STRL (DRAPES) ×2 IMPLANT
ELECT CAUTERY BLADE 6.4 (BLADE) ×2 IMPLANT
ELECT REM PT RETURN 9FT ADLT (ELECTROSURGICAL) ×2
ELECTRODE REM PT RTRN 9FT ADLT (ELECTROSURGICAL) ×1 IMPLANT
GLOVE BIO SURGEON STRL SZ8 (GLOVE) ×2 IMPLANT
GLOVE BIOGEL PI IND STRL 7.5 (GLOVE) ×1 IMPLANT
GLOVE BIOGEL PI IND STRL 8 (GLOVE) ×1 IMPLANT
GLOVE BIOGEL PI INDICATOR 7.5 (GLOVE) ×1
GLOVE BIOGEL PI INDICATOR 8 (GLOVE) ×1
GLOVE SURG SS PI 7.5 STRL IVOR (GLOVE) ×2 IMPLANT
GOWN STRL REUS W/ TWL LRG LVL3 (GOWN DISPOSABLE) ×1 IMPLANT
GOWN STRL REUS W/ TWL XL LVL3 (GOWN DISPOSABLE) ×1 IMPLANT
GOWN STRL REUS W/TWL LRG LVL3 (GOWN DISPOSABLE) ×1
GOWN STRL REUS W/TWL XL LVL3 (GOWN DISPOSABLE) ×1
KIT BASIN OR (CUSTOM PROCEDURE TRAY) ×2 IMPLANT
KIT TURNOVER KIT B (KITS) ×2 IMPLANT
MESH ULTRAPRO 6X6 15CM15CM (Mesh General) ×2 IMPLANT
NEEDLE HYPO 25GX1X1/2 BEV (NEEDLE) ×2 IMPLANT
NS IRRIG 1000ML POUR BTL (IV SOLUTION) ×2 IMPLANT
PACK SURGICAL SETUP 50X90 (CUSTOM PROCEDURE TRAY) ×2 IMPLANT
PAD ARMBOARD 7.5X6 YLW CONV (MISCELLANEOUS) ×2 IMPLANT
PENCIL BUTTON HOLSTER BLD 10FT (ELECTRODE) ×2 IMPLANT
SPONGE LAP 18X18 X RAY DECT (DISPOSABLE) ×2 IMPLANT
SUT ETHIBOND 0 MO6 C/R (SUTURE) ×4 IMPLANT
SUT MNCRL AB 4-0 PS2 18 (SUTURE) ×2 IMPLANT
SUT NOVA NAB DX-16 0-1 5-0 T12 (SUTURE) ×4 IMPLANT
SUT SILK 2 0 SH (SUTURE) ×4 IMPLANT
SUT VIC AB 0 CT1 27 (SUTURE)
SUT VIC AB 0 CT1 27XBRD ANBCTR (SUTURE) IMPLANT
SUT VIC AB 2-0 SH 27 (SUTURE) ×3
SUT VIC AB 2-0 SH 27X BRD (SUTURE) ×3 IMPLANT
SUT VIC AB 3-0 SH 18 (SUTURE) ×4 IMPLANT
SUT VIC AB 3-0 SH 27 (SUTURE) ×1
SUT VIC AB 3-0 SH 27X BRD (SUTURE) ×1 IMPLANT
SUT VICRYL AB 3 0 TIES (SUTURE) ×2 IMPLANT
SYR BULB 3OZ (MISCELLANEOUS) ×2 IMPLANT
SYR CONTROL 10ML LL (SYRINGE) ×2 IMPLANT
TOWEL GREEN STERILE (TOWEL DISPOSABLE) ×2 IMPLANT
TROCAR XCEL NON-BLD 5MMX100MML (ENDOMECHANICALS) ×2 IMPLANT
TUBE CONNECTING 12X1/4 (SUCTIONS) ×2 IMPLANT
TUBING INSUFFLATION (TUBING) ×2 IMPLANT
YANKAUER SUCT BULB TIP NO VENT (SUCTIONS) IMPLANT

## 2018-02-17 NOTE — ED Notes (Signed)
Pt informed he needs to provide a UA. Given urinal / verbalized understanding.

## 2018-02-17 NOTE — Anesthesia Postprocedure Evaluation (Signed)
Anesthesia Post Note  Patient: Steven Johnston  Procedure(s) Performed: OPEN HERNIA REPAIR RIGHT INGUINAL INCARCERATED WITH DIAGONSTIC LAPAROSCOPY FOR INCARCERATED HERNIA (Right Groin)     Patient location during evaluation: PACU Anesthesia Type: General Level of consciousness: awake and alert Pain management: pain level controlled Vital Signs Assessment: post-procedure vital signs reviewed and stable Respiratory status: spontaneous breathing, nonlabored ventilation, respiratory function stable and patient connected to nasal cannula oxygen Cardiovascular status: blood pressure returned to baseline and stable Postop Assessment: no apparent nausea or vomiting Anesthetic complications: no    Last Vitals:  Vitals:   02/17/18 2000 02/17/18 2015  BP: 134/89 134/89  Pulse: 76 73  Resp: 13 12  Temp:    SpO2: 96% 96%    Last Pain:  Vitals:   02/17/18 2015  TempSrc:   PainSc: Noma DAVID

## 2018-02-17 NOTE — Anesthesia Preprocedure Evaluation (Addendum)
Anesthesia Evaluation  Patient identified by MRN, date of birth, ID band Patient awake    Reviewed: Allergy & Precautions, H&P , NPO status , Patient's Chart, lab work & pertinent test results, reviewed documented beta blocker date and time   Airway Mallampati: II  TM Distance: >3 FB Neck ROM: full    Dental no notable dental hx. (+) Poor Dentition, Chipped, Missing, Loose, Dental Advisory Given,    Pulmonary Current Smoker,    Pulmonary exam normal breath sounds clear to auscultation       Cardiovascular Exercise Tolerance: Good negative cardio ROS   Rhythm:regular Rate:Normal     Neuro/Psych negative neurological ROS  negative psych ROS   GI/Hepatic negative GI ROS, (+)     substance abuse  cocaine use, COCAINE USE YESTERDAY AND TWICE PER WEEK   Endo/Other  negative endocrine ROS  Renal/GU negative Renal ROS  negative genitourinary   Musculoskeletal   Abdominal   Peds  Hematology negative hematology ROS (+)   Anesthesia Other Findings   Reproductive/Obstetrics negative OB ROS                           Anesthesia Physical Anesthesia Plan  ASA: III and emergent  Anesthesia Plan: General   Post-op Pain Management:    Induction: Intravenous  PONV Risk Score and Plan: 2 and Ondansetron, Dexamethasone and Treatment may vary due to age or medical condition  Airway Management Planned: Oral ETT  Additional Equipment:   Intra-op Plan:   Post-operative Plan: Extubation in OR  Informed Consent: I have reviewed the patients History and Physical, chart, labs and discussed the procedure including the risks, benefits and alternatives for the proposed anesthesia with the patient or authorized representative who has indicated his/her understanding and acceptance.   Dental Advisory Given and Dental advisory given  Plan Discussed with: CRNA, Anesthesiologist and Surgeon  Anesthesia Plan  Comments: (  )       Anesthesia Quick Evaluation

## 2018-02-17 NOTE — ED Provider Notes (Signed)
Patient placed in Quick Look pathway, seen and evaluated   Chief Complaint: Hernia pain  HPI: Patient has a history of a reducible right inguinal hernia. Today he woke up and is not able to reduce it and has significant pain, vomiting, breaking out into a sweat. Is not sure if he is able to pass gas. No prior hernia surgeries.   ROS: +vomiting  Physical Exam:   Gen: Vomiting, appears anxious  Neuro: Awake and Alert  Skin: Warm    Focused Exam: Right incarcer inguinal hernia which is large, hard and does not reduce.   Initiation of care has begun. The patient has been counseled on the process, plan, and necessity for staying for the completion/evaluation, and the remainder of the medical screening examination    Bernarda Caffey 02/17/18 1317    Fredia Sorrow, MD 02/17/18 [phone]

## 2018-02-17 NOTE — ED Triage Notes (Signed)
Pt presents for evaluation of R hernia to inguinal area. Vomiting, chills and pain started today. Hx of hernia to same area, states it has worsened.

## 2018-02-17 NOTE — ED Provider Notes (Signed)
[facility] EMERGENCY DEPARTMENT Provider Note   CSN: [id number] Arrival date & time: 02/17/18  1254     History   Chief Complaint Chief Complaint  Patient presents with  . Hernia    HPI Steven Johnston is a 57 y.o. male.  HPI Patient presents with nausea vomiting abdominal pain.  Has had an inguinal hernia on the right side for years.  States for the last year it is not reduced states he is to be able to put it in.  Now began to have nausea and vomiting this morning.  States he woke into a sweat.  Did have a bowel movement earlier today.  States his abdomen does feel larger.  Not had a frank fever.  He has not eaten today. Past Medical History:  Diagnosis Date  . Hernia, inguinal, right     Patient Active Problem List   Diagnosis Date Noted  . Incarcerated right inguinal hernia 02/17/2018  . Incarcerated inguinal hernia 02/17/2018    History reviewed. No pertinent surgical history.      Home Medications    Prior to Admission medications   Medication Sig Start Date End Date Taking? Authorizing Provider  cephALEXin (KEFLEX) 500 MG capsule Take 1 capsule (500 mg total) by mouth 4 (four) times daily. 07/06/13   Lajean Saver, MD  ondansetron (ZOFRAN) 8 MG tablet Take 1 tablet (8 mg total) by mouth every 8 (eight) hours as needed for nausea or vomiting. 07/06/13   Lajean Saver, MD    Family History History reviewed. No pertinent family history.  Social History Social History   Tobacco Use  . Smoking status: Current Every Day Smoker  Substance Use Topics  . Alcohol use: Yes  . Drug use: No     Allergies   Patient has no known allergies.   Review of Systems Review of Systems  Constitutional: Positive for appetite change and diaphoresis.  HENT: Negative for congestion.   Respiratory: Negative for shortness of breath.   Cardiovascular: Negative for chest pain.  Gastrointestinal: Positive for abdominal pain, nausea and vomiting. Negative for  constipation.  Genitourinary: Negative for flank pain.  Musculoskeletal: Negative for back pain.  Skin: Negative for rash.  Neurological: Negative for seizures.  Psychiatric/Behavioral: Negative for confusion.     Physical Exam Updated Vital Signs BP 130/83 (BP Location: Left Arm)   Pulse 76   Temp 98.5 F (36.9 C) (Oral)   Resp 14   Ht 5' 9.75" (1.772 m)   Wt 68 kg   SpO2 99%   BMI 21.68 kg/m   Physical Exam  Constitutional: He appears well-developed.  HENT:  Head: Normocephalic.  Eyes: Pupils are equal, round, and reactive to light.  Neck: Neck supple.  Cardiovascular: Normal rate.  Pulmonary/Chest: He has no rales.  Abdominal: He exhibits distension. There is tenderness. A hernia is present.  Right-sided inguinal hernia.  Goes down into right hemiscrotum.  Somewhat tender.  Large and not reducible.  Musculoskeletal: He exhibits no tenderness.  Neurological: He is alert.  Skin: Skin is warm. Capillary refill takes less than 2 seconds.     ED Treatments / Results  Labs (all labs ordered are listed, but only abnormal results are displayed) Labs Reviewed  COMPREHENSIVE METABOLIC PANEL - Abnormal; Notable for the following components:      Result Value   Glucose, Bld 126 (*)    All other components within normal limits  I-STAT CHEM 8, ED - Abnormal; Notable for the following components:  Creatinine, Ser 1.30 (*)    Glucose, Bld 122 (*)    Calcium, Ion 1.10 (*)    All other components within normal limits  CBC WITH DIFFERENTIAL/PLATELET  LIPASE, BLOOD  APTT  PROTIME-INR  URINALYSIS, ROUTINE W REFLEX MICROSCOPIC  RAPID URINE DRUG SCREEN, HOSP PERFORMED  HIV ANTIBODY (ROUTINE TESTING W REFLEX)  BASIC METABOLIC PANEL  CBC  I-STAT CG4 LACTIC ACID, ED  I-STAT CG4 LACTIC ACID, ED  TYPE AND SCREEN  ABO/RH    EKG None  Radiology Ct Abdomen Pelvis W Contrast  Result Date: 02/17/2018 CLINICAL DATA:  History of reducible right inguinal hernia. New onset of  pain, vomiting and sweating. EXAM: CT ABDOMEN AND PELVIS WITH CONTRAST TECHNIQUE: Multidetector CT imaging of the abdomen and pelvis was performed using the standard protocol following bolus administration of intravenous contrast. CONTRAST:  153mL OMNIPAQUE IOHEXOL 300 MG/ML  SOLN COMPARISON:  None. FINDINGS: Lower chest: Clear lung bases. Normal heart size without pericardial or pleural effusion. Hepatobiliary: Normal liver. Normal gallbladder, without biliary ductal dilatation. Pancreas: Normal, without mass or ductal dilatation. Spleen: Normal in size, without focal abnormality. Adrenals/Urinary Tract: Normal adrenal glands. Possible punctate left renal collecting system calculi versus early contrast excretion. No hydronephrosis. Normal urinary bladder. Stomach/Bowel: Normal stomach, without wall thickening. Normal colon and appendix. The proximal small bowel loops are normal in caliber. A right inguinal hernia contains distal small bowel including distal ileum. There is edema within the herniated small bowel, as well as within a short segment of distal ileum just proximal to the hernia. This edematous bowel is hypoenhancing to nonenhancing, highly suspicious for ischemia. No pneumatosis or free intraperitoneal air. Vascular/Lymphatic: Normal caliber of the aorta and branch vessels. No abdominopelvic adenopathy. Reproductive: Normal prostate. Other: No significant free fluid. Musculoskeletal: No acute osseous abnormality. IMPRESSION: 1. Right inguinal hernia containing distal small bowel, with edema within the herniated bowel and adjacent distal ileum, suspicious for strangulation and complicating ischemia. 2. No bowel obstruction. 3. Possible left nephrolithiasis. These results were called by telephone at the time of interpretation on 02/17/2018 at 2:42 pm to Dr. Alvino Chapel , who verbally acknowledged these results. Electronically Signed   By: Abigail Miyamoto M.D.   On: 02/17/2018 14:50    Procedures Procedures  (including critical care time)  Medications Ordered in ED Medications  0.9 %  sodium chloride infusion ( Intravenous Stopped 02/17/18 2113)  ondansetron (ZOFRAN-ODT) disintegrating tablet 4 mg ( Oral MAR Unhold 02/17/18 2039)    Or  ondansetron (ZOFRAN) injection 4 mg ( Intravenous MAR Unhold 02/17/18 2039)  LORazepam (ATIVAN) tablet 1 mg ( Oral MAR Unhold 02/17/18 2039)    Or  LORazepam (ATIVAN) injection 1 mg ( Intravenous MAR Unhold 02/17/18 2039)  thiamine (VITAMIN B-1) tablet 100 mg ( Oral MAR Unhold 02/17/18 2039)    Or  thiamine (B-1) injection 100 mg ( Intravenous MAR Unhold 9/79/89 2119)  folic acid (FOLVITE) tablet 1 mg ( Oral MAR Unhold 02/17/18 2039)  multivitamin with minerals tablet 1 tablet ( Oral MAR Unhold 02/17/18 2039)  LORazepam (ATIVAN) injection 0-4 mg (0 mg Intravenous Not Given 02/17/18 2222)    Followed by  LORazepam (ATIVAN) injection 0-4 mg ( Intravenous MAR Unhold 02/17/18 2039)  lactated ringers infusion ( Intravenous Anesthesia Volume Adjustment 02/17/18 1942)  heparin injection 5,000 Units (5,000 Units Subcutaneous Not Given 02/17/18 2221)  lactated ringers infusion ( Intravenous New Bag/Given 02/17/18 2120)  diphenhydrAMINE (BENADRYL) 12.5 MG/5ML elixir 12.5 mg (has no administration in time range)  Or  diphenhydrAMINE (BENADRYL) injection 12.5 mg (has no administration in time range)  gabapentin (NEURONTIN) capsule 300 mg (300 mg Oral Given 02/17/18 2112)  ondansetron (ZOFRAN-ODT) disintegrating tablet 4 mg (has no administration in time range)    Or  ondansetron (ZOFRAN) injection 4 mg (has no administration in time range)  docusate sodium (COLACE) capsule 200 mg (200 mg Oral Given 02/17/18 2112)  simethicone (MYLICON) chewable tablet 40 mg (has no administration in time range)  hydrALAZINE (APRESOLINE) injection 10 mg (has no administration in time range)  acetaminophen (TYLENOL) tablet 1,000 mg (1,000 mg Oral Given 02/17/18 2112)  ibuprofen (ADVIL,MOTRIN) tablet  600 mg (has no administration in time range)  traMADol (ULTRAM) tablet 50 mg (has no administration in time range)  HYDROmorphone (DILAUDID) injection 0.5 mg (has no administration in time range)  ondansetron (ZOFRAN) injection 4 mg (4 mg Intravenous Given 02/17/18 1320)  morphine 4 MG/ML injection 4 mg (4 mg Intravenous Given 02/17/18 1320)  iohexol (OMNIPAQUE) 300 MG/ML solution 100 mL (100 mLs Intravenous Contrast Given 02/17/18 1415)  HYDROmorphone (DILAUDID) injection 0.5 mg (0.5 mg Intravenous Given 02/17/18 1435)  HYDROmorphone (DILAUDID) injection 1 mg (1 mg Intravenous Given 02/17/18 1503)  cefoTEtan (CEFOTAN) 1 g in sodium chloride 0.9 % 100 mL IVPB ( Intravenous MAR Unhold 02/17/18 2039)     Initial Impression / Assessment and Plan / ED Course  I have reviewed the triage vital signs and the nursing notes.  Pertinent labs & imaging results that were available during my care of the patient were reviewed by me and considered in my medical decision making (see chart for details).     Patient with incarcerated right-sided inguinal hernia.  Possible bowel injury on CT scan.  Taken to the OR by general surgery per  Final Clinical Impressions(s) / ED Diagnoses   Final diagnoses:  Right inguinal hernia    ED Discharge Orders    None       Davonna Belling, MD 02/17/18 2234

## 2018-02-17 NOTE — Transfer of Care (Signed)
Immediate Anesthesia Transfer of Care Note  Patient: Steven Johnston  Procedure(s) Performed: OPEN HERNIA REPAIR RIGHT INGUINAL INCARCERATED WITH DIAGONSTIC LAPAROSCOPY FOR INCARCERATED HERNIA (Right Groin)  Patient Location: PACU  Anesthesia Type:General  Level of Consciousness: drowsy  Airway & Oxygen Therapy: Patient Spontanous Breathing and Patient connected to face mask oxygen  Post-op Assessment: Report given to RN and Post -op Vital signs reviewed and stable  Post vital signs: Reviewed and stable  Last Vitals:  Vitals Value Taken Time  BP 149/96 02/17/2018  7:41 PM  Temp    Pulse 92 02/17/2018  7:42 PM  Resp 16 02/17/2018  7:42 PM  SpO2 96 % 02/17/2018  7:42 PM  Vitals shown include unvalidated device data.  Last Pain:  Vitals:   02/17/18 1534  TempSrc:   PainSc: 6          Complications: No apparent anesthesia complications

## 2018-02-17 NOTE — H&P (Addendum)
Steven Johnston is an 57 y.o. male.   Chief Complaint: Right inguinal hernia pain, nausea and vomiting   HPI: pt presented to the ED today with above complaint.  Pt has had this for some time (over a year) and is normally reducible, in the ED they could not reduce it.  It seems like this came out today, but I can't be certain.   Work up shows he is afebrile, VS were stable.  Labs show an elevated creatinine of 1.30, remainder of the CMP was normal.  Lactate 1.39.  WBC 7.2, H/H 14.7/46.8. CT scan shows:  Right inguinal hernia containing distal small bowel, with edema within the herniated bowel and adjacent distal ileum, suspicious for strangulation and complicating ischemia.  No bowel obstruction. Possible left nephrolithiasis.  History reviewed. No pertinent past medical history.  None reported  History reviewed. No pertinent surgical history.  None reported  No family history on file. Social History:  reports that he has been smoking. He does not have any smokeless tobacco history on file. He reports that he drinks alcohol. He reports that he does not use drugs. Tobacco:  < 1 PPD Drugs:  Cocaine last use yesterday ETOH:  + use - at least 1 can of beer per day Lives with mother Works: construction/painting  Allergies: No Known Allergies  Prior to Admission medications   Medication Sig Start Date End Date Taking? Authorizing Provider  cephALEXin (KEFLEX) 500 MG capsule Take 1 capsule (500 mg total) by mouth 4 (four) times daily. 07/06/13   Lajean Saver, MD  ondansetron (ZOFRAN) 8 MG tablet Take 1 tablet (8 mg total) by mouth every 8 (eight) hours as needed for nausea or vomiting. 07/06/13   Lajean Saver, MD     Results for orders placed or performed during the hospital encounter of 02/17/18 (from the past 48 hour(s))  CBC with Differential     Status: None   Collection Time: 02/17/18  1:16 PM  Result Value Ref Range   WBC 7.2 4.0 - 10.5 K/uL   RBC 5.38 4.22 - 5.81 MIL/uL    Hemoglobin 14.7 13.0 - 17.0 g/dL   HCT 46.8 39.0 - 52.0 %   MCV 87.0 78.0 - 100.0 fL   MCH 27.3 26.0 - 34.0 pg   MCHC 31.4 30.0 - 36.0 g/dL   RDW 13.4 11.5 - 15.5 %   Platelets 302 150 - 400 K/uL   Neutrophils Relative % 45 %   Lymphocytes Relative 41 %   Monocytes Relative 12 %   Eosinophils Relative 1 %   Basophils Relative 1 %   Band Neutrophils 0 %   Metamyelocytes Relative 0 %   Myelocytes 0 %   Promyelocytes Relative 0 %   Blasts 0 %   nRBC 0 0 /100 WBC   Other 0 %   Neutro Abs 3.1 1.7 - 7.7 K/uL   Lymphs Abs 3.0 0.7 - 4.0 K/uL   Monocytes Absolute 0.9 0.1 - 1.0 K/uL   Eosinophils Absolute 0.1 0.0 - 0.7 K/uL   Basophils Absolute 0.1 0.0 - 0.1 K/uL   WBC Morphology MILD LEFT SHIFT (1-5% METAS, OCC MYELO, OCC BANDS)     Comment: Performed at [facility], [address], [place], [place] [address]  Comprehensive metabolic panel     Status: Abnormal   Collection Time: 02/17/18  1:16 PM  Result Value Ref Range   Sodium 138 135 - 145 mmol/L   Potassium 3.8 3.5 - 5.1 mmol/L  Chloride 104 98 - 111 mmol/L   CO2 25 22 - 32 mmol/L   Glucose, Bld 126 (H) 70 - 99 mg/dL   BUN 11 6 - 20 mg/dL   Creatinine, Ser 1.24 0.61 - 1.24 mg/dL   Calcium 9.0 8.9 - 10.3 mg/dL   Total Protein 7.9 6.5 - 8.1 g/dL   Albumin 3.6 3.5 - 5.0 g/dL   AST 22 15 - 41 U/L   ALT 11 0 - 44 U/L   Alkaline Phosphatase 123 38 - 126 U/L   Total Bilirubin 0.8 0.3 - 1.2 mg/dL   GFR calc non Af Amer >60 >60 mL/min   GFR calc Af Amer >60 >60 mL/min    Comment: (NOTE) The eGFR has been calculated using the CKD EPI equation. This calculation has not been validated in all clinical situations. eGFR's persistently <60 mL/min signify possible Chronic Kidney Disease.    Anion gap 9 5 - 15    Comment: Performed at [facility] [address] [place], [place] [address]  Lipase, blood     Status: None   Collection Time: 02/17/18  1:16 PM  Result Value Ref Range   Lipase 23 11 - 51 U/L    Comment:  Performed at [facility] [address], [place], [place] [address]  I-Stat Chem 8, ED     Status: Abnormal   Collection Time: 02/17/18  1:24 PM  Result Value Ref Range   Sodium 139 135 - 145 mmol/L   Potassium 3.8 3.5 - 5.1 mmol/L   Chloride 103 98 - 111 mmol/L   BUN 15 6 - 20 mg/dL   Creatinine, Ser 1.30 (H) 0.61 - 1.24 mg/dL   Glucose, Bld 122 (H) 70 - 99 mg/dL   Calcium, Ion 1.10 (L) 1.15 - 1.40 mmol/L   TCO2 27 22 - 32 mmol/L   Hemoglobin 16.0 13.0 - 17.0 g/dL   HCT 47.0 39.0 - 52.0 %  I-Stat CG4 Lactic Acid, ED     Status: None   Collection Time: 02/17/18  1:25 PM  Result Value Ref Range   Lactic Acid, Venous 1.39 0.5 - 1.9 mmol/L   No results found.  Review of Systems  Constitutional: Negative.   HENT: Negative.   Eyes: Negative.   Respiratory: Negative.   Cardiovascular: Negative.   Gastrointestinal: Positive for abdominal pain, nausea and vomiting.  Genitourinary: Negative.        Pain right scrotum  Musculoskeletal: Negative.   Skin: Negative.   Neurological: Negative.   Endo/Heme/Allergies: Negative.   Psychiatric/Behavioral: Negative.     Blood pressure 124/86, pulse 76, temperature 97.6 F (36.4 C), temperature source Oral, resp. rate 20, height 5' 9.75" (1.772 m), weight 68 kg, SpO2 97 %. Physical Exam  Constitutional: He is oriented to person, place, and time. He appears distressed (pain from incarcerated hernia).  Poorly kept male in a lot of pain.   HENT:  Head: Normocephalic and atraumatic.  Mouth/Throat: Oropharynx is clear and moist. No oropharyngeal exudate.  Eyes: Right eye exhibits no discharge. Left eye exhibits no discharge. No scleral icterus.  Equal - just had dilaudid  Neck: Normal range of motion. Neck supple. No JVD present. No tracheal deviation present. No thyromegaly present.  Cardiovascular: Normal rate, regular rhythm, normal heart sounds and intact distal pulses.  No murmur heard. Respiratory: Effort normal and breath  sounds normal. No respiratory distress. He has no wheezes. He has no rales. He exhibits no tenderness.  GI:  He exhibits distension and mass. There is no tenderness. There is no rebound and no guarding.  Genitourinary:  Genitourinary Comments: Large hard right inguinal hernia, filling the scrotum.  It is hard and tender, I could not reduce it even with 1.5 mg IV dilaudid  Musculoskeletal: He exhibits no edema or tenderness.  Lymphadenopathy:    He has no cervical adenopathy.  Neurological: He is alert and oriented to person, place, and time. No cranial nerve deficit.  Skin: Skin is warm and dry. No rash noted. He is not diaphoretic. No erythema. No pallor.  Psychiatric: He has a normal mood and affect. His behavior is normal. Judgment and thought content normal.     Assessment/Plan Incarcerated RIH - possible ischemic bowel Tobacco use ETOH use Cocaine use  Plan:  Dr. Alvino Chapel and I both tried to reduce hernia without success.  Admit, hydrate, plan for surgery tonight.  SIWA protocol. He is posted with the OR for Dr. Brantley Stage or Dr. Dema Severin for tonight.  Kashmere Staffa, PA-C 02/17/2018, 2:34 PM

## 2018-02-17 NOTE — Op Note (Signed)
02/17/2018  7:21 PM  PATIENT:  Steven Johnston  57 y.o. male  Patient Care Team: Patient, No Pcp Per as PCP - General (General Practice)  PRE-OPERATIVE DIAGNOSIS:  Incarcerated right inguinal hernia containing small bowel  POST-OPERATIVE DIAGNOSIS:  Incarcerated right indirect inguinal hernia containing viable small bowel  PROCEDURE:  1. Open right inguinal hernia repair with mesh 2. Diagnostic laparoscopy  SURGEON:  Sharon Mt. Whitaker Holderman, MD  ASSISTANT: Scrub nurse  ANESTHESIA:  General endotracheal  COUNTS:  Sponge, needle and instrument counts were reported correct x2 at the conclusion of the operation.  EBL: 10cc  DRAINS: None  SPECIMEN: None  FINDINGS: Indirect right inguinal hernia - while prepping patient it subsequently reduced. After opening hernia sac and before decision for mesh, a 32mm port was placed inside the hernia sac and diagnostic laparoscopy performed. The loop of distal ileum was identified and clearly viable. There were some subserosal petechiae consistent with incarceration but the loop of small bowel was pink, the mesentery was healthy appearing, as was the cecum. Therefore mesh repair was performed.  INDICATION: Steven Johnston is a pleasant 5yoM with no known past medical hx presented to ED today with 1-2d hx of incarceration of a right groin hernia. He has had this may years but usually reducible, this time it was not. Has had abdominal wall pain; denies pain in the hernia sac. Had n/v on arrival. BM and flatus early this morning.  In the emergency room, he was evaluated by the ED staff and noted to have an incarcerated right inguinal hernia that despite their attempts could not be reduced.  He was afebrile normal vital signs, his labs were normal including his Steven Johnston count and lactate levels.  He underwent a CT scan of the abdomen pelvis which demonstrated a right inguinal hernia containing distal small bowel with edema within the herniated bowel and adjacent  distal ileum.  He was then seen by the surgical team and attempts were made at reduction but none were successful.  Options were discussed with the patient and he opted to pursue an open inguinal hernia repair with possible placement of mesh.  We discussed the potential for laparoscopy/laparotomy if the segment of incarcerated bowel did reduce.  We discussed the possibility of a bowel resection.  Please refer to H&P for details regarding all this discussion.  DESCRIPTION: The patient was identified in preop holding and taken to the OR where they were placed supine on the operating room table and SCDs were placed. General endotracheal anesthesia was induced without difficulty. Hair in the region of the planned incisions was clipped. The patient was then prepped and draped in the usual sterile fashion. A surgical timeout was performed indicating the correct patient, procedure, positioning and need for preoperative antibiotics.  0.25% Marcaine with epinephrine was used to anesthetize the skin over the mid-portion of the inguinal canal. An oblique incision was made. Dissection was carried down through the subcutaneous tissue and Scarpa's fascia with cautery to the external oblique fascia.  The external oblique fascia was opened along the direction of its fibers to the external ring.  The spermatic cord and hernia sac were circumferentially dissected bluntly and retracted with a Penrose drain.  The ilioinguinal nerve was identified.  Ultimately, the ilioinguinal nerve layed directly on top of the mesh that was deployed and given concerns for potential neuroma and chronic pain, the decision was made to divide this nerve, trading potential cutaneous numbness for chronic pain. The floor of the inguinal canal was  inspected and an indirect hernia sac was identified.  The contents of the sac had reduced while the scrub nurse was prepping his groin.  The cord structures were all identified and then dissected. The hernia sac  was identified, care being taken not to skeletonize the cord, this was dissected free from the cord structures.  The hernia sac was quite large and extended all the way down into his scrotum.  For this reason, the entire sac was not resected as it would risk testicular ischemia from extensive skeletonization of the spermatic cord.  Contents were verified to have been reduced and the sac was thin-walled.  The empty sac was then carefully opened sharply at the distal end of the sac where it was thinnest.  Peritoneal entry was apparent.  The patient was then placed in Trendelenburg with right side up.  Through this opening in the peritoneum, a 5 mm laparoscopic trocar was placed and secured with a 2-0 Vicryl suture around the hernia sac.  The peritoneal cavity then insufflated with CO2 to a maximum pressure of 15 mmHg.  The 5 Steven Johnston laparoscope was inserted and inspection revealed the distal ileum lying just beneath the right anal canal.  There was some subserosal petechiae but the bowel was clearly pink and viable.  The mesentery going to the segment of bowel was also viable.  The cecum was viable.  At this point, the laparoscope was removed and the peritoneal cavity desufflated. High ligation of the sac was performed under direct visualization and the sac suture ligated with a 2-0 Vicryl suture.  Given that there were no compromised segments of intestine, the decision was made to repair his hernia with mesh. A piece of light weight prolene mesh was cut to fit the inguinal floor and deployed. The mesh was secured to the pubic tubercle using two 0 Ethibond sutures - ensuring that there was 2cm of overlap between the mesh and the pubic tubercle. The mesh was then secured using additional 0 Ethibond suture to the shelving edge of the inguinal ligament inferiorly and the conjoined tendon superiorly. The mesh tails were tucked underneath the external oblique fascia laterally.  The tails of the mesh were then closed around  the spermatic cord to recreate the internal inguinal ring and this ring was secured using 0 Ethibond suture.  This was made to be just loose enough to accommodate the tip of the finger so as to reduce the risk of spinal cord ischemia.  The inguinal floor was then irrigated with sterile saline.  Hemostasis was then verified.  The external oblique fascia was reapproximated with 2-0 Vicryl.  3-0 Vicryl was then used to close Scarpa's fascia. A running 4-0 Monocryl subcuticular suture was used to close the skin.  Dermabond was the applied. The patient was awakened from general anesthesia, extubated and taken to the recovery in satisfactory condition.  DISPOSITION: PACU in satisfactory condition

## 2018-02-17 NOTE — Anesthesia Procedure Notes (Signed)
Procedure Name: Intubation Date/Time: 02/17/2018 5:53 PM Performed by: Zebulon Gantt T, CRNA Pre-anesthesia Checklist: Patient identified, Emergency Drugs available, Suction available and Patient being monitored Patient Re-evaluated:Patient Re-evaluated prior to induction Oxygen Delivery Method: Circle system utilized Preoxygenation: Pre-oxygenation with 100% oxygen Induction Type: IV induction Ventilation: Mask ventilation without difficulty and Oral airway inserted - appropriate to patient size Laryngoscope Size: Miller and 3 Grade View: Grade I Tube type: Oral Tube size: 7.5 mm Number of attempts: 1 Airway Equipment and Method: Patient positioned with wedge pillow and Stylet Placement Confirmation: ETT inserted through vocal cords under direct vision,  positive ETCO2 and breath sounds checked- equal and bilateral Secured at: 22 cm Tube secured with: Tape Dental Injury: Teeth and Oropharynx as per pre-operative assessment

## 2018-02-18 ENCOUNTER — Encounter (HOSPITAL_COMMUNITY): Payer: Self-pay | Admitting: *Deleted

## 2018-02-18 ENCOUNTER — Other Ambulatory Visit: Payer: Self-pay

## 2018-02-18 LAB — CBC
HCT: 40.1 % (ref 39.0–52.0)
HEMOGLOBIN: 12.8 g/dL — AB (ref 13.0–17.0)
MCH: 27.6 pg (ref 26.0–34.0)
MCHC: 31.9 g/dL (ref 30.0–36.0)
MCV: 86.6 fL (ref 78.0–100.0)
Platelets: 259 10*3/uL (ref 150–400)
RBC: 4.63 MIL/uL (ref 4.22–5.81)
RDW: 13.7 % (ref 11.5–15.5)
WBC: 6.9 10*3/uL (ref 4.0–10.5)

## 2018-02-18 LAB — BASIC METABOLIC PANEL
ANION GAP: 5 (ref 5–15)
BUN: 8 mg/dL (ref 6–20)
CO2: 26 mmol/L (ref 22–32)
Calcium: 8.1 mg/dL — ABNORMAL LOW (ref 8.9–10.3)
Chloride: 105 mmol/L (ref 98–111)
Creatinine, Ser: 1.06 mg/dL (ref 0.61–1.24)
Glucose, Bld: 95 mg/dL (ref 70–99)
Potassium: 3.7 mmol/L (ref 3.5–5.1)
Sodium: 136 mmol/L (ref 135–145)

## 2018-02-18 MED ORDER — TRAMADOL HCL 50 MG PO TABS: 50.0000 mg | ORAL_TABLET | Freq: Four times a day (QID) | ORAL | 0 refills | Status: DC | PRN

## 2018-02-18 MED ORDER — INFLUENZA VAC SPLIT QUAD 0.5 ML IM SUSY: 0.5000 mL | PREFILLED_SYRINGE | INTRAMUSCULAR | Status: DC

## 2018-02-18 MED ORDER — PNEUMOCOCCAL VAC POLYVALENT 25 MCG/0.5ML IJ INJ: 0.5000 mL | INJECTION | INTRAMUSCULAR | Status: DC

## 2018-02-18 NOTE — Progress Notes (Signed)
Patient discharged to home with prescriptions and instructions.

## 2018-02-18 NOTE — Discharge Instructions (Signed)
[facility] _______[facility] ° °UMBILICAL OR INGUINAL HERNIA REPAIR: POST OP INSTRUCTIONS ° °Always review your discharge instruction sheet given to you by the facility where your surgery was performed. °IF YOU HAVE DISABILITY OR FAMILY LEAVE FORMS, YOU MUST BRING THEM TO THE OFFICE FOR PROCESSING.   °DO NOT GIVE THEM TO YOUR DOCTOR. ° °1. A  prescription for pain medication may be given to you upon discharge.  Take your pain medication as prescribed, if needed.  If narcotic pain medicine is not needed, then you may take acetaminophen (Tylenol) or ibuprofen (Advil) as needed. °2. Take your usually prescribed medications unless otherwise directed. °If you need a refill on your pain medication, please contact your pharmacy.  They will contact our office to request authorization. Prescriptions will not be filled after 5 pm or on week-ends. °3. You should follow a light diet the first 24 hours after arrival home, such as soup and crackers, etc.  Be sure to include lots of fluids daily.  Resume your normal diet the day after surgery. °4.Most patients will experience some swelling and bruising around the umbilicus or in the groin and scrotum.  Ice packs and reclining will help.  Swelling and bruising can take several days to resolve.  °6. It is common to experience some constipation if taking pain medication after surgery.  Increasing fluid intake and taking a stool softener (such as Colace) will usually help or prevent this problem from occurring.  A mild laxative (Milk of Magnesia or Miralax) should be taken according to package directions if there are no bowel movements after 48 hours. °7. Unless discharge instructions indicate otherwise, you may remove your bandages 24-48 hours after surgery, and you may shower at that time.  You may have steri-strips (small skin tapes) in place directly over the incision.  These strips should be left on the skin for 7-10 days.  If your surgeon used skin glue on the  incision, you may shower in 24 hours.  The glue will flake off over the next 2-3 weeks.  Any sutures or staples will be removed at the office during your follow-up visit. °8. ACTIVITIES:  You may resume regular (light) daily activities beginning the next day--such as daily self-care, walking, climbing stairs--gradually increasing activities as tolerated.  You may have sexual intercourse when it is comfortable.  Refrain from any heavy lifting or straining until approved by your doctor. ° °a.You may drive when you are no longer taking prescription pain medication, you can comfortably wear a seatbelt, and you can safely maneuver your car and apply brakes. °b.RETURN TO WORK:   °_____________________________________________ ° °9.You should see your doctor in the office for a follow-up appointment approximately 2-3 weeks after your surgery.  Make sure that you call for this appointment within a day or two after you arrive home to insure a convenient appointment time. °10.OTHER INSTRUCTIONS: _________________________ °   _____________________________________ ° °WHEN TO CALL YOUR DOCTOR: °1. Fever over 101.0 °2. Inability to urinate °3. Nausea and/or vomiting °4. Extreme swelling or bruising °5. Continued bleeding from incision. °6. Increased pain, redness, or drainage from the incision ° °The clinic staff is available to answer your questions during regular business hours.  Please don’t hesitate to call and ask to speak to one of the nurses for clinical concerns.  If you have a medical emergency, go to the nearest emergency room or call 911.  A surgeon from [facility] is always on call at the hospital ° ° °  [address], [address], [place], [place]  [address] ? ° [address] [address], [place],    [address] °[phone] ? [phone] ? FAX [phone] °Web site: [email or web address] °

## 2018-02-18 NOTE — Plan of Care (Signed)
  Problem: Clinical Measurements: Goal: Postoperative complications will be avoided or minimized Outcome: Progressing   Problem: Skin Integrity: Goal: Demonstration of wound healing without infection will improve Outcome: Progressing   

## 2018-02-18 NOTE — Discharge Summary (Signed)
[facility] Discharge Summary   Patient ID: Steven Johnston MRN: [id number] DOB/AGE: 1961/02/14 57 y.o.  Admit date: 02/17/2018 Discharge date: 02/18/2018  Admitting Diagnosis: Incarcerated R inguinal hernia  Discharge Diagnosis Patient Active Problem List   Diagnosis Date Noted  . Incarcerated right inguinal hernia 02/17/2018  . Incarcerated inguinal hernia 02/17/2018    Consultants none  Imaging: Ct Abdomen Pelvis W Contrast  Result Date: 02/17/2018 CLINICAL DATA:  History of reducible right inguinal hernia. New onset of pain, vomiting and sweating. EXAM: CT ABDOMEN AND PELVIS WITH CONTRAST TECHNIQUE: Multidetector CT imaging of the abdomen and pelvis was performed using the standard protocol following bolus administration of intravenous contrast. CONTRAST:  114mL OMNIPAQUE IOHEXOL 300 MG/ML  SOLN COMPARISON:  None. FINDINGS: Lower chest: Clear lung bases. Normal heart size without pericardial or pleural effusion. Hepatobiliary: Normal liver. Normal gallbladder, without biliary ductal dilatation. Pancreas: Normal, without mass or ductal dilatation. Spleen: Normal in size, without focal abnormality. Adrenals/Urinary Tract: Normal adrenal glands. Possible punctate left renal collecting system calculi versus early contrast excretion. No hydronephrosis. Normal urinary bladder. Stomach/Bowel: Normal stomach, without wall thickening. Normal colon and appendix. The proximal small bowel loops are normal in caliber. A right inguinal hernia contains distal small bowel including distal ileum. There is edema within the herniated small bowel, as well as within a short segment of distal ileum just proximal to the hernia. This edematous bowel is hypoenhancing to nonenhancing, highly suspicious for ischemia. No pneumatosis or free intraperitoneal air. Vascular/Lymphatic: Normal caliber of the aorta and branch vessels. No abdominopelvic adenopathy. Reproductive: Normal prostate. Other: No  significant free fluid. Musculoskeletal: No acute osseous abnormality. IMPRESSION: 1. Right inguinal hernia containing distal small bowel, with edema within the herniated bowel and adjacent distal ileum, suspicious for strangulation and complicating ischemia. 2. No bowel obstruction. 3. Possible left nephrolithiasis. These results were called by telephone at the time of interpretation on 02/17/2018 at 2:42 pm to Dr. Alvino Chapel , who verbally acknowledged these results. Electronically Signed   By: Abigail Miyamoto M.D.   On: 02/17/2018 14:50    Procedures Dr. Dema Severin (02/17/18) - open right inguinal hernia repair with mesh, diagnostic laparoscopy  HPI: Mr. Savoca is a pleasant 21yoM with no known past medical hx presented to ED today with 1-2d hx of incarceration of a right groin hernia. He has had this may years but usually reducible, this time it was not. Has had abdominal wall pain; denies pain in the hernia sac. Had n/v on arrival. BM and flatus early this morning. Denies f/c.  Hospital Course:  Patient was admitted and underwent procedure listed above.  Tolerated procedure well and was transferred to the floor.  Diet was advanced as tolerated.  On POD#1, the patient was voiding well, tolerating diet, ambulating well, pain well controlled, vital signs stable, incisions c/d/i and felt stable for discharge home.  Patient will follow up as outlined below and knows to call with questions or concerns.     Patient was discharged in good condition.  The [place] Substance controlled database was reviewed prior to prescribing narcotic pain medication to this patient.  Physical Exam: General:  Alert, NAD, pleasant, cooperative Cardio: RRR, S1 & S2 normal, no murmur, rubs, gallops Resp: Effort normal, lungs CTA bilaterally, no wheezes, rales, rhonchi Abd:  Soft, ND, normal bowel sounds, no tenderness GU: groin incision is well appearing with glue intact and is without drainage. Mild edema around  incision, no erythema, no signs of infection. Mild TTP around incision.  Skin: warm and dry, no rashes noted Neuro: alert, oriented, follows commands  Allergies as of 02/18/2018   No Known Allergies     Medication List    STOP taking these medications   cephALEXin 500 MG capsule Commonly known as:  KEFLEX   ondansetron 8 MG tablet Commonly known as:  ZOFRAN     TAKE these medications   traMADol 50 MG tablet Commonly known as:  ULTRAM Take 1 tablet (50 mg total) by mouth every 6 (six) hours as needed (pain not controlled with ibuprofen).        Follow-up [facility] [place]. Call.   Specialty:  General Surgery Why:  to see when your follow up appointment is. Please arrive 30 minutes prior to your appointment to complete paperwork. Please bring photo ID and insurance card.  Contact information: [address] [place] [phone]          Signed: Fraser Din [facility] 02/18/2018, 8:30 AM Pager: [phone] Consults: [phone] Mon-Fri 7:00 am-4:30 pm Sat-Sun 7:00 am-11:30 am

## 2018-02-19 LAB — HIV 1/2 AB DIFFERENTIATION
HIV 1 Ab: POSITIVE — AB
HIV 2 Ab: NEGATIVE

## 2018-02-19 LAB — HIV ANTIBODY (ROUTINE TESTING W REFLEX): HIV SCREEN 4TH GENERATION: REACTIVE — AB

## 2018-02-22 ENCOUNTER — Telehealth: Payer: Self-pay | Admitting: *Deleted

## 2018-02-22 NOTE — Telephone Encounter (Signed)
Per message from Dr Linus Salmons information sent to [facility] to track patient down and bring to care.

## 2018-02-22 NOTE — Telephone Encounter (Signed)
-----   Message from Thayer Headings, MD sent at 02/20/2018 12:50 PM EDT ----- New positive B20.  Needs [facility].  thanks

## 2019-04-14 ENCOUNTER — Emergency Department (HOSPITAL_COMMUNITY)
Admission: EM | Admit: 2019-04-14 | Discharge: 2019-04-14 | Disposition: A | Discharge status: Home / Self Care | Payer: Self-pay | Attending: Emergency Medicine | Admitting: Emergency Medicine

## 2019-04-14 ENCOUNTER — Encounter (HOSPITAL_COMMUNITY): Payer: Self-pay

## 2019-04-14 ENCOUNTER — Other Ambulatory Visit: Payer: Self-pay

## 2019-04-14 DIAGNOSIS — F17210 Nicotine dependence, cigarettes, uncomplicated: Secondary | ICD-10-CM | POA: Insufficient documentation

## 2019-04-14 DIAGNOSIS — R1012 Left upper quadrant pain: Secondary | ICD-10-CM | POA: Insufficient documentation

## 2019-04-14 NOTE — ED Provider Notes (Signed)
[facility] DEPT Provider Note   CSN: [id number] Arrival date & time: 04/14/19  0427     History   Chief Complaint Chief Complaint  Patient presents with  . Abdominal Pain    HPI Abas Leicht is a 58 y.o. male.     58 year old male presents with complaint of a lump in his abdomen after coughing.  Patient states that he was coughing really hard when he felt something pop and noticed a knot in his left upper quadrant area.  Patient is able to push this back in an area has resolved as of now.  Patient reports prior right inguinal hernia that was incarcerated was repaired surgically.  Patient denies fevers, nausea or vomiting or any other complaints or concerns tonight.     Past Medical History:  Diagnosis Date  . Hernia, inguinal, right     Patient Active Problem List   Diagnosis Date Noted  . Incarcerated right inguinal hernia 02/17/2018  . Incarcerated inguinal hernia 02/17/2018    Past Surgical History:  Procedure Laterality Date  . INGUINAL HERNIA REPAIR Right 02/17/2018   Procedure: OPEN HERNIA REPAIR RIGHT INGUINAL INCARCERATED WITH DIAGONSTIC LAPAROSCOPY FOR INCARCERATED HERNIA;  Surgeon: Ileana Roup, MD;  Location: [facility];  Service: General;  Laterality: Right;        Home Medications    Prior to Admission medications   Medication Sig Start Date End Date Taking? Authorizing Provider  traMADol (ULTRAM) 50 MG tablet Take 1 tablet (50 mg total) by mouth every 6 (six) hours as needed (pain not controlled with ibuprofen). 02/18/18   Kalman Drape, PA    Family History History reviewed. No pertinent family history.  Social History Social History   Tobacco Use  . Smoking status: Current Every Day Smoker    Packs/day: 1.00  . Smokeless tobacco: Never Used  Substance Use Topics  . Alcohol use: Yes  . Drug use: No     Allergies   Patient has no known allergies.   Review of Systems Review of Systems   Constitutional: Negative for fever.  Respiratory: Negative for shortness of breath.   Cardiovascular: Negative for chest pain.  Gastrointestinal: Negative for abdominal pain, nausea and vomiting.  Musculoskeletal: Negative for arthralgias and myalgias.  Skin: Negative for rash and wound.  Allergic/Immunologic: Negative for immunocompromised state.  Psychiatric/Behavioral: Negative for confusion.  All other systems reviewed and are negative.    Physical Exam Updated Vital Signs BP 128/86 (BP Location: Left Arm)   Pulse 99   Temp 97.6 F (36.4 C) (Oral)   Resp 15   Ht 5\' 9"  (1.753 m)   Wt 68 kg   SpO2 95%   BMI 22.15 kg/m   Physical Exam Vitals signs and nursing note reviewed.  Constitutional:      General: He is not in acute distress.    Appearance: He is well-developed. He is not diaphoretic.  HENT:     Head: Normocephalic and atraumatic.  Cardiovascular:     Rate and Rhythm: Normal rate and regular rhythm.     Heart sounds: Normal heart sounds.  Pulmonary:     Effort: Pulmonary effort is normal.     Breath sounds: Normal breath sounds.  Abdominal:     General: Abdomen is flat. Bowel sounds are normal.     Palpations: Abdomen is soft.     Tenderness: There is no abdominal tenderness.  Skin:    General: Skin is warm and dry.  Findings: No rash.  Neurological:     Mental Status: He is alert and oriented to person, place, and time.  Psychiatric:        Behavior: Behavior normal.      ED Treatments / Results  Labs (all labs ordered are listed, but only abnormal results are displayed) Labs Reviewed - No data to display  EKG None  Radiology No results found.  Procedures Procedures (including critical care time)  Medications Ordered in ED Medications - No data to display   Initial Impression / Assessment and Plan / ED Course  I have reviewed the triage vital signs and the nursing notes.  Pertinent labs & imaging results that were available during  my care of the patient were reviewed by me and considered in my medical decision making (see chart for details).  Clinical Course as of Apr 13 500  Thu Apr 13, 2425  7789 24-year-old male presents with complaint of pain in left upper abdomen, states that he coughed and had something pop in the area and he was able to push it back in.  No complaints or concerns at this time.  Question hernia based on history although nothing present at this time.  Recommend follow-up with  and wellness if problem persists, may need surgical evaluation, advised return to ER for new or worsening symptoms.   [LM]    Clinical Course User Index [LM] Tacy Learn, PA-C      Final Clinical Impressions(s) / ED Diagnoses   Final diagnoses:  Left upper quadrant abdominal pain    ED Discharge Orders    None       Roque Lias 04/14/19 0501    Molpus, Jenny Reichmann, MD 04/14/19 [phone]

## 2019-04-14 NOTE — ED Triage Notes (Signed)
Pt BIB [facility] from home for abdominal pain. States that he coughed and felt something pop in his L upper abdomen, states that he pushed it back in. Hx of hernias.

## 2019-04-14 NOTE — Discharge Instructions (Addendum)
Follow-up with [facility] and wellness.  Return to ER if pain returns and you are unable to make this go away.

## 2020-03-08 ENCOUNTER — Ambulatory Visit (HOSPITAL_COMMUNITY)
Admission: EM | Admit: 2020-03-08 | Discharge: 2020-03-08 | Disposition: A | Discharge status: Home / Self Care | Payer: Self-pay | Attending: Internal Medicine | Admitting: Internal Medicine

## 2020-03-08 ENCOUNTER — Encounter (HOSPITAL_COMMUNITY): Payer: Self-pay

## 2020-03-08 ENCOUNTER — Other Ambulatory Visit: Payer: Self-pay

## 2020-03-08 DIAGNOSIS — G43109 Migraine with aura, not intractable, without status migrainosus: Secondary | ICD-10-CM

## 2020-03-08 MED ORDER — METOCLOPRAMIDE HCL 5 MG/ML IJ SOLN
5.0000 mg | Freq: Once | INTRAMUSCULAR | Status: AC
  Administered 2020-03-08: 5 mg via INTRAMUSCULAR

## 2020-03-08 MED ORDER — IBUPROFEN 600 MG PO TABS: 600.0000 mg | ORAL_TABLET | Freq: Four times a day (QID) | ORAL | 0 refills | Status: DC | PRN

## 2020-03-08 MED ORDER — METOCLOPRAMIDE HCL 5 MG/ML IJ SOLN
INTRAMUSCULAR | Status: AC
  Filled 2020-03-08: qty 2

## 2020-03-08 MED ORDER — DEXAMETHASONE SODIUM PHOSPHATE 10 MG/ML IJ SOLN
INTRAMUSCULAR | Status: AC
  Filled 2020-03-08: qty 1

## 2020-03-08 MED ORDER — SUMATRIPTAN SUCCINATE 25 MG PO TABS: 25.0000 mg | ORAL_TABLET | Freq: Once | ORAL | 0 refills | Status: DC

## 2020-03-08 MED ORDER — DEXAMETHASONE SODIUM PHOSPHATE 10 MG/ML IJ SOLN
10.0000 mg | Freq: Once | INTRAMUSCULAR | Status: AC
  Administered 2020-03-08: 10 mg via INTRAMUSCULAR

## 2020-03-08 MED ORDER — SUMATRIPTAN SUCCINATE 6 MG/0.5ML ~~LOC~~ SOLN
SUBCUTANEOUS | Status: AC
  Filled 2020-03-08: qty 0.5

## 2020-03-08 MED ORDER — SUMATRIPTAN SUCCINATE 6 MG/0.5ML ~~LOC~~ SOLN
6.0000 mg | Freq: Once | SUBCUTANEOUS | Status: AC
  Administered 2020-03-08: 6 mg via SUBCUTANEOUS

## 2020-03-08 NOTE — ED Triage Notes (Signed)
Pt presents with ongoing headache more focused around his eye area for past few weeks.

## 2020-03-09 NOTE — ED Provider Notes (Signed)
[facility]    CSN: [id number] Arrival date & time: 03/08/20  9417      History   Chief Complaint Chief Complaint  Patient presents with  . Headache    HPI Steven Johnston is a 59 y.o. male comes to the urgent care with complaint of left retro-orbital/periorbital sharp headaches which have been ongoing for the past 3 weeks.  Headache is intermittent, aggravated by bright light and some noise, no known relieving factors and associated with some noncolored floaters in his visual field.  No neck pain.  No history of fall or trauma to the head.   HPI  Past Medical History:  Diagnosis Date  . Hernia, inguinal, right     Patient Active Problem List   Diagnosis Date Noted  . Incarcerated right inguinal hernia 02/17/2018  . Incarcerated inguinal hernia 02/17/2018    Past Surgical History:  Procedure Laterality Date  . INGUINAL HERNIA REPAIR Right 02/17/2018   Procedure: OPEN HERNIA REPAIR RIGHT INGUINAL INCARCERATED WITH DIAGONSTIC LAPAROSCOPY FOR INCARCERATED HERNIA;  Surgeon: Ileana Roup, MD;  Location: [facility];  Service: General;  Laterality: Right;       Home Medications    Prior to Admission medications   Medication Sig Start Date End Date Taking? Authorizing Provider  ibuprofen (ADVIL) 600 MG tablet Take 1 tablet (600 mg total) by mouth every 6 (six) hours as needed. 03/08/20   Kenzee Bassin, Myrene Galas, MD  SUMAtriptan (IMITREX) 25 MG tablet Take 1 tablet (25 mg total) by mouth once for 1 dose. May repeat in 2 hours if headache persists or recurs. 03/08/20 03/08/20  LampteyMyrene Galas, MD  traMADol (ULTRAM) 50 MG tablet Take 1 tablet (50 mg total) by mouth every 6 (six) hours as needed (pain not controlled with ibuprofen). 02/18/18   Kalman Drape, PA    Family History Family History  Family history unknown: Yes    Social History Social History   Tobacco Use  . Smoking status: Current Every Day Smoker    Packs/day: 1.00  . Smokeless tobacco: Never  Used  Substance Use Topics  . Alcohol use: Yes  . Drug use: No     Allergies   Patient has no known allergies.   Review of Systems Review of Systems  Constitutional: Negative.   Eyes: Positive for photophobia. Negative for visual disturbance.  Respiratory: Negative for cough and shortness of breath.   Gastrointestinal: Negative.   Musculoskeletal: Negative for arthralgias, gait problem and joint swelling.  Skin: Negative.   Neurological: Positive for headaches. Negative for dizziness, light-headedness and numbness.     Physical Exam Triage Vital Signs ED Triage Vitals  Enc Vitals Group     BP 03/08/20 1016 (!) 136/91     Pulse Rate 03/08/20 1016 66     Resp 03/08/20 1016 18     Temp 03/08/20 1016 98.8 F (37.1 C)     Temp Source 03/08/20 1016 Oral     SpO2 03/08/20 1016 95 %     Weight --      Height --      Head Circumference --      Peak Flow --      Pain Score 03/08/20 1015 6     Pain Loc --      Pain Edu? --      Excl. in [place]? --    No data found.  Updated Vital Signs BP (!) 136/91 (BP Location: Left Arm)   Pulse 66  Temp 98.8 F (37.1 C) (Oral)   Resp 18   SpO2 95%   Visual Acuity Right Eye Distance:   Left Eye Distance:   Bilateral Distance:    Right Eye Near:   Left Eye Near:    Bilateral Near:     Physical Exam Vitals and nursing note reviewed.  Constitutional:      Appearance: He is well-developed.  HENT:     Head: Normocephalic and atraumatic.  Cardiovascular:     Rate and Rhythm: Normal rate and regular rhythm.  Pulmonary:     Effort: Pulmonary effort is normal.  Abdominal:     Palpations: Abdomen is soft.  Musculoskeletal:     Cervical back: Normal range of motion and neck supple.  Neurological:     Mental Status: He is alert.     GCS: GCS eye subscore is 4. GCS verbal subscore is 5. GCS motor subscore is 6.     Cranial Nerves: No cranial nerve deficit, dysarthria or facial asymmetry.     Sensory: No sensory deficit.      Motor: No weakness.     Coordination: Romberg sign negative.      UC Treatments / Results  Labs (all labs ordered are listed, but only abnormal results are displayed) Labs Reviewed - No data to display  EKG   Radiology No results found.  Procedures Procedures (including critical care time)  Medications Ordered in UC Medications  metoCLOPramide (REGLAN) injection 5 mg (5 mg Intramuscular Given 03/08/20 1123)  dexamethasone (DECADRON) injection 10 mg (10 mg Intramuscular Given 03/08/20 1123)  SUMAtriptan (IMITREX) injection 6 mg (6 mg Subcutaneous Given 03/08/20 1123)    Initial Impression / Assessment and Plan / UC Course  I have reviewed the triage vital signs and the nursing notes.  Pertinent labs & imaging results that were available during my care of the patient were reviewed by me and considered in my medical decision making (see chart for details).     1.  Migraine headache with aura: Abortive therapy with Reglan, dexamethasone and Imitrex We will prescribe ibuprofen 600 mg every 6 hours as needed for pain Oral Imitrex 25 mg to be used at the outset of the headache Return to urgent care if pain worsens. Final Clinical Impressions(s) / UC Diagnoses   Final diagnoses:  Migraine with aura and without status migrainosus, not intractable   Discharge Instructions   None    ED Prescriptions    Medication Sig Dispense Auth. Provider   ibuprofen (ADVIL) 600 MG tablet Take 1 tablet (600 mg total) by mouth every 6 (six) hours as needed. 30 tablet Opal Dinning, Myrene Galas, MD   SUMAtriptan (IMITREX) 25 MG tablet Take 1 tablet (25 mg total) by mouth once for 1 dose. May repeat in 2 hours if headache persists or recurs. 15 tablet Aide Wojnar, Myrene Galas, MD     PDMP not reviewed this encounter.   Chase Picket, MD 03/09/20 [phone]

## 2020-06-22 ENCOUNTER — Ambulatory Visit (HOSPITAL_COMMUNITY): Payer: Self-pay

## 2020-08-09 ENCOUNTER — Ambulatory Visit (HOSPITAL_COMMUNITY): Payer: Self-pay | Admitting: Licensed Clinical Social Worker

## 2020-08-12 ENCOUNTER — Ambulatory Visit (HOSPITAL_COMMUNITY)
Admission: EM | Admit: 2020-08-12 | Discharge: 2020-08-12 | Disposition: A | Discharge status: Home / Self Care | Payer: Medicaid Other | Attending: Student | Admitting: Student

## 2020-08-12 ENCOUNTER — Encounter (HOSPITAL_COMMUNITY): Payer: Self-pay

## 2020-08-12 ENCOUNTER — Other Ambulatory Visit: Payer: Self-pay

## 2020-08-12 DIAGNOSIS — J209 Acute bronchitis, unspecified: Secondary | ICD-10-CM

## 2020-08-12 DIAGNOSIS — F17200 Nicotine dependence, unspecified, uncomplicated: Secondary | ICD-10-CM

## 2020-08-12 DIAGNOSIS — Z8719 Personal history of other diseases of the digestive system: Secondary | ICD-10-CM

## 2020-08-12 DIAGNOSIS — G43709 Chronic migraine without aura, not intractable, without status migrainosus: Secondary | ICD-10-CM

## 2020-08-12 DIAGNOSIS — Z9889 Other specified postprocedural states: Secondary | ICD-10-CM

## 2020-08-12 MED ORDER — ALBUTEROL SULFATE HFA 108 (90 BASE) MCG/ACT IN AERS: 1.0000 | INHALATION_SPRAY | Freq: Four times a day (QID) | RESPIRATORY_TRACT | 0 refills | Status: DC | PRN

## 2020-08-12 MED ORDER — PREDNISONE 10 MG (21) PO TBPK: ORAL_TABLET | Freq: Every day | ORAL | 0 refills | Status: DC

## 2020-08-12 NOTE — ED Triage Notes (Addendum)
Pt present a non productive cough with right side hip pain. Pt states symptom started a month ago. Pt states the pain comes from him coughing real hard and deep.  Pt has tried otc medication with no relief.

## 2020-08-12 NOTE — ED Provider Notes (Signed)
[facility]    CSN: [id number] Arrival date & time: 08/12/20  1122      History   Chief Complaint Chief Complaint  Patient presents with  . Cough    HPI Steven Johnston is a 60 y.o. male presenting for cough for 1 month and right side pain for 1 day. History incarcerated R inguinal hernia repair 2019. Current every day smoker but no diagnosis of COPD and does not use inhalers.  Endorses episodes of hacking dry cough throughout the day.  Has tried over-the-counter medications like Mucinex without improvement.  Shortness of breath with exertion. he is concerned that he has developed COPD due to history of smoking. Denies fevers/chills, n/v/d, chest pain, congestion, facial pain, teeth pain, headaches, sore throat, loss of taste/smell, swollen lymph nodes, ear pain.   Frequent migraines that are moderately well controlled on Excedrin Migraine; no current headache or migraine. Unable to afford the Rizatriptan.  Denies groin pain, change in bowel or bladder function.  Bowel movements are regular and he is still passing gas.  Denies abdominal pain  HPI  Past Medical History:  Diagnosis Date  . Hernia, inguinal, right     Patient Active Problem List   Diagnosis Date Noted  . Incarcerated right inguinal hernia 02/17/2018  . Incarcerated inguinal hernia 02/17/2018    Past Surgical History:  Procedure Laterality Date  . INGUINAL HERNIA REPAIR Right 02/17/2018   Procedure: OPEN HERNIA REPAIR RIGHT INGUINAL INCARCERATED WITH DIAGONSTIC LAPAROSCOPY FOR INCARCERATED HERNIA;  Surgeon: Ileana Roup, MD;  Location: [facility];  Service: General;  Laterality: Right;       Home Medications    Prior to Admission medications   Medication Sig Start Date End Date Taking? Authorizing Provider  albuterol (VENTOLIN HFA) 108 (90 Base) MCG/ACT inhaler Inhale 1-2 puffs into the lungs every 6 (six) hours as needed for wheezing or shortness of breath. 08/12/20  Yes Hazel Sams, PA-C   predniSONE (STERAPRED UNI-PAK 21 TAB) 10 MG (21) TBPK tablet Take by mouth daily. Take 6 tabs by mouth daily  for 2 days, then 5 tabs for 2 days, then 4 tabs for 2 days, then 3 tabs for 2 days, 2 tabs for 2 days, then 1 tab by mouth daily for 2 days 08/12/20  Yes Phillip Heal, Sherlon Handing, PA-C  ibuprofen (ADVIL) 600 MG tablet Take 1 tablet (600 mg total) by mouth every 6 (six) hours as needed. 03/08/20   Lamptey, Myrene Galas, MD  SUMAtriptan (IMITREX) 25 MG tablet Take 1 tablet (25 mg total) by mouth once for 1 dose. May repeat in 2 hours if headache persists or recurs. 03/08/20 03/08/20  LampteyMyrene Galas, MD  traMADol (ULTRAM) 50 MG tablet Take 1 tablet (50 mg total) by mouth every 6 (six) hours as needed (pain not controlled with ibuprofen). 02/18/18   Kalman Drape, PA    Family History Family History  Family history unknown: Yes    Social History Social History   Tobacco Use  . Smoking status: Current Every Day Smoker    Packs/day: 1.00  . Smokeless tobacco: Never Used  Substance Use Topics  . Alcohol use: Yes  . Drug use: No     Allergies   Patient has no known allergies.   Review of Systems Review of Systems  Constitutional: Negative for appetite change, chills and fever.  HENT: Negative for congestion, ear pain, rhinorrhea, sinus pressure, sinus pain and sore throat.   Eyes: Negative for redness and visual  disturbance.  Respiratory: Positive for cough. Negative for chest tightness, shortness of breath and wheezing.   Cardiovascular: Negative for chest pain and palpitations.  Gastrointestinal: Negative for abdominal distention, abdominal pain, anal bleeding, blood in stool, constipation, diarrhea, nausea, rectal pain and vomiting.  Genitourinary: Negative for decreased urine volume, difficulty urinating, dysuria, enuresis, flank pain, frequency, genital sores, hematuria, penile discharge, penile pain, penile swelling, scrotal swelling, testicular pain and urgency.  Musculoskeletal:  Negative for myalgias.  Neurological: Negative for dizziness, weakness and headaches.  Psychiatric/Behavioral: Negative for confusion.  All other systems reviewed and are negative.    Physical Exam Triage Vital Signs ED Triage Vitals  Enc Vitals Group     BP      Pulse      Resp      Temp      Temp src      SpO2      Weight      Height      Head Circumference      Peak Flow      Pain Score      Pain Loc      Pain Edu?      Excl. in [place]?    No data found.  Updated Vital Signs BP (!) 130/91 (BP Location: Right Arm)   Pulse 88   Temp 97.9 F (36.6 C) (Oral)   Resp 16   SpO2 91%   Visual Acuity Right Eye Distance:   Left Eye Distance:   Bilateral Distance:    Right Eye Near:   Left Eye Near:    Bilateral Near:     Physical Exam Vitals reviewed.  Constitutional:      General: He is not in acute distress.    Appearance: Normal appearance. He is not ill-appearing.  HENT:     Head: Normocephalic and atraumatic.     Comments: Poor dentician    Right Ear: Hearing, tympanic membrane, ear canal and external ear normal. No swelling or tenderness. There is no impacted cerumen. No mastoid tenderness. Tympanic membrane is not perforated, erythematous, retracted or bulging.     Left Ear: Hearing, tympanic membrane, ear canal and external ear normal. No swelling or tenderness. There is no impacted cerumen. No mastoid tenderness. Tympanic membrane is not perforated, erythematous, retracted or bulging.     Nose:     Right Sinus: No maxillary sinus tenderness or frontal sinus tenderness.     Left Sinus: No maxillary sinus tenderness or frontal sinus tenderness.     Mouth/Throat:     Mouth: Mucous membranes are moist.     Pharynx: Uvula midline. No oropharyngeal exudate or posterior oropharyngeal erythema.     Tonsils: No tonsillar exudate.  Eyes:     Extraocular Movements: Extraocular movements intact.     Pupils: Pupils are equal, round, and reactive to light.   Cardiovascular:     Rate and Rhythm: Normal rate and regular rhythm.     Heart sounds: Normal heart sounds.  Pulmonary:     Effort: Pulmonary effort is normal.     Breath sounds: Normal breath sounds and air entry. No decreased breath sounds, wheezing, rhonchi or rales.     Comments: Occ dry cough Chest:     Chest wall: No tenderness.  Abdominal:     General: Abdomen is flat. Bowel sounds are normal.     Tenderness: There is no abdominal tenderness. There is no guarding or rebound.       Comments: Right side pain  to deep palpation No rib pain or deformity  Lymphadenopathy:     Cervical: No cervical adenopathy.  Neurological:     General: No focal deficit present.     Mental Status: He is alert and oriented to person, place, and time.     Comments: CN 2-12 grossly intact  Psychiatric:        Attention and Perception: Attention and perception normal.        Mood and Affect: Mood and affect normal.        Behavior: Behavior normal. Behavior is cooperative.        Thought Content: Thought content normal.        Judgment: Judgment normal.      UC Treatments / Results  Labs (all labs ordered are listed, but only abnormal results are displayed) Labs Reviewed - No data to display  EKG   Radiology No results found.  Procedures Procedures (including critical care time)  Medications Ordered in UC Medications - No data to display  Initial Impression / Assessment and Plan / UC Course  I have reviewed the triage vital signs and the nursing notes.  Pertinent labs & imaging results that were available during my care of the patient were reviewed by me and considered in my medical decision making (see chart for details).      This patient is a 60 year old male presenting with acute bronchitis. He is a current smoker.   For acute bronchitis, prednisone taper and albuterol as below.  He is not a diabetic.  Precontemplative of smoking cessation.  Continue Excedrin Migraine  for migraines.  States he cannot afford the Imitrex.  This patient has right side pain today, worse with coughing.  He also has a history of right-sided incarcerated inguinal hernia that was repaired 3 years ago.  No current hernia or inguinal pain.  Exam benign.  Strict return precautions for this patient considering history.  New groin pain, change in bowel movements, constipation, urinary changes-head straight to the ER or call 911.  Patient verbalizes understanding and agreement.  Return precautions discussed.  This chart was dictated using voice recognition software, Dragon. Despite the best efforts of this provider to proofread and correct errors, errors may still occur which can change documentation meaning.   Final Clinical Impressions(s) / UC Diagnoses   Final diagnoses:  Acute bronchitis, unspecified organism  Current smoker  Chronic migraine without aura without status migrainosus, not intractable  History of right inguinal hernia repair     Discharge Instructions     -Start the prednisone taper.  Follow the instructions on the package.  This will help with the inflammation in your lungs go down, which will help with your cough and bronchitis. -Use the albuterol inhaler as needed for cough and shortness of breath. -Continue Excedrin Migraine as needed for migraines. -If you develop worsening shortness of breath despite treatment; chest pain abdominal pain, changes in bowel movements, changes in bladder function, you stop passing gas, etc.-seek immediate medical attention.    ED Prescriptions    Medication Sig Dispense Auth. Provider   predniSONE (STERAPRED UNI-PAK 21 TAB) 10 MG (21) TBPK tablet Take by mouth daily. Take 6 tabs by mouth daily  for 2 days, then 5 tabs for 2 days, then 4 tabs for 2 days, then 3 tabs for 2 days, 2 tabs for 2 days, then 1 tab by mouth daily for 2 days 42 tablet Hazel Sams, PA-C   albuterol (VENTOLIN HFA) 108 (90 Base) MCG/ACT  inhaler Inhale  1-2 puffs into the lungs every 6 (six) hours as needed for wheezing or shortness of breath. 1 each Hazel Sams, PA-C     PDMP not reviewed this encounter.   Hazel Sams, PA-C 08/12/20 1255

## 2020-08-12 NOTE — Discharge Instructions (Signed)
-  Start the prednisone taper.  Follow the instructions on the package.  This will help with the inflammation in your lungs go down, which will help with your cough and bronchitis. -Use the albuterol inhaler as needed for cough and shortness of breath. -Continue Excedrin Migraine as needed for migraines. -If you develop worsening shortness of breath despite treatment; chest pain abdominal pain, changes in bowel movements, changes in bladder function, you stop passing gas, etc.-seek immediate medical attention.

## 2020-09-30 ENCOUNTER — Inpatient Hospital Stay (HOSPITAL_COMMUNITY)
Admission: EM | Admit: 2020-09-30 | Discharge: 2020-10-06 | DRG: 974 | Disposition: A | Discharge status: Home / Self Care | Payer: Self-pay | Attending: Internal Medicine | Admitting: Internal Medicine

## 2020-09-30 ENCOUNTER — Emergency Department (HOSPITAL_COMMUNITY): Payer: Self-pay

## 2020-09-30 ENCOUNTER — Encounter (HOSPITAL_COMMUNITY): Payer: Self-pay | Admitting: Emergency Medicine

## 2020-09-30 DIAGNOSIS — R64 Cachexia: Secondary | ICD-10-CM | POA: Diagnosis present

## 2020-09-30 DIAGNOSIS — J849 Interstitial pulmonary disease, unspecified: Secondary | ICD-10-CM

## 2020-09-30 DIAGNOSIS — R61 Generalized hyperhidrosis: Secondary | ICD-10-CM

## 2020-09-30 DIAGNOSIS — Z6820 Body mass index (BMI) 20.0-20.9, adult: Secondary | ICD-10-CM

## 2020-09-30 DIAGNOSIS — R531 Weakness: Secondary | ICD-10-CM

## 2020-09-30 DIAGNOSIS — E43 Unspecified severe protein-calorie malnutrition: Secondary | ICD-10-CM

## 2020-09-30 DIAGNOSIS — B20 Human immunodeficiency virus [HIV] disease: Principal | ICD-10-CM | POA: Diagnosis present

## 2020-09-30 DIAGNOSIS — B59 Pneumocystosis: Secondary | ICD-10-CM | POA: Diagnosis present

## 2020-09-30 DIAGNOSIS — J189 Pneumonia, unspecified organism: Secondary | ICD-10-CM | POA: Diagnosis present

## 2020-09-30 DIAGNOSIS — Z20822 Contact with and (suspected) exposure to covid-19: Secondary | ICD-10-CM | POA: Diagnosis present

## 2020-09-30 DIAGNOSIS — F17210 Nicotine dependence, cigarettes, uncomplicated: Secondary | ICD-10-CM | POA: Diagnosis present

## 2020-09-30 DIAGNOSIS — J431 Panlobular emphysema: Secondary | ICD-10-CM | POA: Diagnosis present

## 2020-09-30 DIAGNOSIS — J9601 Acute respiratory failure with hypoxia: Secondary | ICD-10-CM | POA: Diagnosis present

## 2020-09-30 DIAGNOSIS — Z809 Family history of malignant neoplasm, unspecified: Secondary | ICD-10-CM

## 2020-09-30 HISTORY — DX: Human immunodeficiency virus (HIV) disease: B20

## 2020-09-30 HISTORY — DX: Asymptomatic human immunodeficiency virus (hiv) infection status: Z21

## 2020-09-30 LAB — CBC
HCT: 41.6 % (ref 39.0–52.0)
Hemoglobin: 13.2 g/dL (ref 13.0–17.0)
MCH: 26.9 pg (ref 26.0–34.0)
MCHC: 31.7 g/dL (ref 30.0–36.0)
MCV: 84.7 fL (ref 80.0–100.0)
Platelets: 376 10*3/uL (ref 150–400)
RBC: 4.91 MIL/uL (ref 4.22–5.81)
RDW: 13.5 % (ref 11.5–15.5)
WBC: 6.1 10*3/uL (ref 4.0–10.5)
nRBC: 0 % (ref 0.0–0.2)

## 2020-09-30 LAB — BASIC METABOLIC PANEL
Anion gap: 6 (ref 5–15)
BUN: 14 mg/dL (ref 6–20)
CO2: 24 mmol/L (ref 22–32)
Calcium: 8.4 mg/dL — ABNORMAL LOW (ref 8.9–10.3)
Chloride: 106 mmol/L (ref 98–111)
Creatinine, Ser: 0.84 mg/dL (ref 0.61–1.24)
GFR, Estimated: >60 mL/min (ref >60)
Glucose, Bld: 82 mg/dL (ref 70–99)
Potassium: 4.3 mmol/L (ref 3.5–5.1)
Sodium: 136 mmol/L (ref 135–145)

## 2020-09-30 LAB — RESP PANEL BY RT-PCR (FLU A&B, COVID) ARPGX2
Influenza A by PCR: NEGATIVE
Influenza B by PCR: NEGATIVE
SARS Coronavirus 2 by RT PCR: NEGATIVE

## 2020-09-30 LAB — BRAIN NATRIURETIC PEPTIDE: B Natriuretic Peptide: 7 pg/mL (ref 0.0–100.0)

## 2020-09-30 LAB — TROPONIN I (HIGH SENSITIVITY): Troponin I (High Sensitivity): 3 ng/L (ref <18)

## 2020-09-30 MED ORDER — PREDNISONE 20 MG PO TABS
40.0000 mg | ORAL_TABLET | Freq: Two times a day (BID) | ORAL | Status: AC
  Administered 2020-10-01 – 2020-10-05 (×10): 40 mg via ORAL
  Filled 2020-09-30 (×10): qty 2

## 2020-09-30 MED ORDER — TUBERCULIN PPD 5 UNIT/0.1ML ID SOLN
5.0000 [IU] | Freq: Once | INTRADERMAL | Status: AC
  Administered 2020-09-30: 5 [IU] via INTRADERMAL
  Filled 2020-09-30: qty 0.1

## 2020-09-30 MED ORDER — SODIUM CHLORIDE 0.9 % IV SOLN
1.0000 g | Freq: Once | INTRAVENOUS | Status: AC
  Administered 2020-09-30: 1 g via INTRAVENOUS
  Filled 2020-09-30: qty 10

## 2020-09-30 MED ORDER — CEFTRIAXONE SODIUM 2 G IJ SOLR
2.0000 g | INTRAMUSCULAR | Status: AC
  Administered 2020-10-01 – 2020-10-05 (×5): 2 g via INTRAVENOUS
  Filled 2020-09-30 (×3): qty 20
  Filled 2020-09-30 (×2): qty 2

## 2020-09-30 MED ORDER — SODIUM CHLORIDE 0.9% FLUSH
3.0000 mL | Freq: Once | INTRAVENOUS | Status: AC
  Administered 2020-09-30: 3 mL via INTRAVENOUS

## 2020-09-30 MED ORDER — ENOXAPARIN SODIUM 40 MG/0.4ML IJ SOSY
40.0000 mg | PREFILLED_SYRINGE | INTRAMUSCULAR | Status: DC
  Administered 2020-10-01 – 2020-10-05 (×5): 40 mg via SUBCUTANEOUS
  Filled 2020-09-30 (×5): qty 0.4

## 2020-09-30 MED ORDER — PREDNISONE 20 MG PO TABS
40.0000 mg | ORAL_TABLET | Freq: Once | ORAL | Status: AC
  Administered 2020-09-30: 40 mg via ORAL
  Filled 2020-09-30: qty 2

## 2020-09-30 MED ORDER — SULFAMETHOXAZOLE-TRIMETHOPRIM 400-80 MG/5ML IV SOLN
320.0000 mg | Freq: Three times a day (TID) | INTRAVENOUS | Status: DC
  Administered 2020-09-30 – 2020-10-03 (×8): 320 mg via INTRAVENOUS
  Filled 2020-09-30 (×12): qty 20

## 2020-09-30 MED ORDER — SODIUM CHLORIDE 0.9 % IV SOLN
500.0000 mg | Freq: Once | INTRAVENOUS | Status: AC
  Administered 2020-09-30: 500 mg via INTRAVENOUS
  Filled 2020-09-30: qty 500

## 2020-09-30 MED ORDER — SODIUM CHLORIDE 0.9 % IV SOLN
500.0000 mg | INTRAVENOUS | Status: DC
  Administered 2020-10-01 – 2020-10-02 (×2): 500 mg via INTRAVENOUS
  Filled 2020-09-30 (×3): qty 500

## 2020-09-30 NOTE — ED Notes (Signed)
O2 dropped to 85. This RN placed pt on 2L [place]. Ashok Cordia, MD has been notified.

## 2020-09-30 NOTE — ED Provider Notes (Signed)
[facility] EMERGENCY DEPARTMENT Provider Note   CSN: [id number] Arrival date & time: 09/30/20  1334     History Chief Complaint  Patient presents with  . Cough  . Weakness    Steven Johnston is a 61 y.o. male.  Patient states he generally hasnt felt well in the past 1-2 months. States productive cough, clear phlegm, 20 lb wt decrease, general weakness, sob w coughing spell, night sweats, subjective fever.  Symptoms acute onset, moderate, persistent, slowly has felt worse, without acute/abrupt change today or this week. Denies specific known ill contacts. Denies headache. No chest pain or discomfort. No abd pain or nvd. No dysuria or gu c/o. No rash. Denies hx chronic illness, hiv, asthma, tb. +smoker. No hemoptysis. No leg pain or swelling. Pt very poor historian - level 5 caveat.   The history is provided by the patient. The history is limited by the condition of the patient.       Past Medical History:  Diagnosis Date  . Hernia, inguinal, right     Patient Active Problem List   Diagnosis Date Noted  . Incarcerated right inguinal hernia 02/17/2018  . Incarcerated inguinal hernia 02/17/2018    Past Surgical History:  Procedure Laterality Date  . INGUINAL HERNIA REPAIR Right 02/17/2018   Procedure: OPEN HERNIA REPAIR RIGHT INGUINAL INCARCERATED WITH DIAGONSTIC LAPAROSCOPY FOR INCARCERATED HERNIA;  Surgeon: Ileana Roup, MD;  Location: [facility];  Service: General;  Laterality: Right;       Family History  Family history unknown: Yes    Social History   Tobacco Use  . Smoking status: Current Every Day Smoker    Packs/day: 1.00  . Smokeless tobacco: Never Used  Substance Use Topics  . Alcohol use: Yes  . Drug use: No    Home Medications Prior to Admission medications   Medication Sig Start Date End Date Taking? Authorizing Provider  albuterol (VENTOLIN HFA) 108 (90 Base) MCG/ACT inhaler Inhale 1-2 puffs into the lungs every 6 (six) hours  as needed for wheezing or shortness of breath. 08/12/20   Hazel Sams, PA-C  ibuprofen (ADVIL) 600 MG tablet Take 1 tablet (600 mg total) by mouth every 6 (six) hours as needed. 03/08/20   Lamptey, Myrene Galas, MD  predniSONE (STERAPRED UNI-PAK 21 TAB) 10 MG (21) TBPK tablet Take by mouth daily. Take 6 tabs by mouth daily  for 2 days, then 5 tabs for 2 days, then 4 tabs for 2 days, then 3 tabs for 2 days, 2 tabs for 2 days, then 1 tab by mouth daily for 2 days 08/12/20   Hazel Sams, PA-C  SUMAtriptan (IMITREX) 25 MG tablet Take 1 tablet (25 mg total) by mouth once for 1 dose. May repeat in 2 hours if headache persists or recurs. 03/08/20 03/08/20  LampteyMyrene Galas, MD  traMADol (ULTRAM) 50 MG tablet Take 1 tablet (50 mg total) by mouth every 6 (six) hours as needed (pain not controlled with ibuprofen). 02/18/18   Kalman Drape, PA    Allergies    Patient has no known allergies.  Review of Systems   Review of Systems  Constitutional: Positive for diaphoresis and fever.  HENT: Negative for sinus pain and sore throat.   Eyes: Negative for redness.  Respiratory: Positive for cough and shortness of breath.   Cardiovascular: Negative for chest pain and leg swelling.  Gastrointestinal: Negative for abdominal pain, diarrhea and vomiting.  Genitourinary: Negative for dysuria and flank pain.  Musculoskeletal: Negative for back pain and neck pain.  Skin: Negative for rash.  Neurological: Negative for headaches.  Hematological: Does not bruise/bleed easily.  Psychiatric/Behavioral: Negative for confusion.    Physical Exam Updated Vital Signs BP 111/76 (BP Location: Left Arm)   Pulse 76   Temp 98.9 F (37.2 C) (Oral)   Resp (!) 25   Ht 1.753 m (5\' 9" )   Wt 63.5 kg   SpO2 92%   BMI 20.67 kg/m   Physical Exam Vitals and nursing note reviewed.  Constitutional:      Appearance: He is well-developed.     Comments: Thin, frail appearing for age.   HENT:     Head: Atraumatic.     Nose:  Nose normal.     Mouth/Throat:     Mouth: Mucous membranes are moist.     Pharynx: Oropharynx is clear.     Comments:  No thrush.  Eyes:     General: No scleral icterus.    Conjunctiva/sclera: Conjunctivae normal.     Pupils: Pupils are equal, round, and reactive to light.  Neck:     Trachea: No tracheal deviation.     Comments: No stiffness or rigidity. No mass. Thyroid not grossly enlarged or tender.  Cardiovascular:     Rate and Rhythm: Normal rate and regular rhythm.     Pulses: Normal pulses.     Heart sounds: Normal heart sounds. No murmur heard. No friction rub. No gallop.   Pulmonary:     Effort: Pulmonary effort is normal. No accessory muscle usage or respiratory distress.     Breath sounds: Normal breath sounds.  Abdominal:     General: Bowel sounds are normal. There is no distension.     Palpations: Abdomen is soft. There is no mass.     Tenderness: There is no abdominal tenderness.  Genitourinary:    Comments: No cva tenderness. Musculoskeletal:        General: No swelling or tenderness.     Cervical back: Normal range of motion and neck supple. No rigidity.     Right lower leg: No edema.     Left lower leg: No edema.  Skin:    General: Skin is warm and dry.     Findings: No rash.  Neurological:     Mental Status: He is alert.     Comments: Alert, speech clear. Steady gait.   Psychiatric:        Mood and Affect: Mood normal.     ED Results / Procedures / Treatments   Labs (all labs ordered are listed, but only abnormal results are displayed) Results for orders placed or performed during the hospital encounter of 16/10/96  Basic metabolic panel  Result Value Ref Range   Sodium 136 135 - 145 mmol/L   Potassium 4.3 3.5 - 5.1 mmol/L   Chloride 106 98 - 111 mmol/L   CO2 24 22 - 32 mmol/L   Glucose, Bld 82 70 - 99 mg/dL   BUN 14 6 - 20 mg/dL   Creatinine, Ser 0.84 0.61 - 1.24 mg/dL   Calcium 8.4 (L) 8.9 - 10.3 mg/dL   GFR, Estimated >60 >60 mL/min    Anion gap 6 5 - 15  CBC  Result Value Ref Range   WBC 6.1 4.0 - 10.5 K/uL   RBC 4.91 4.22 - 5.81 MIL/uL   Hemoglobin 13.2 13.0 - 17.0 g/dL   HCT 41.6 39.0 - 52.0 %   MCV 84.7 80.0 - 100.0  fL   MCH 26.9 26.0 - 34.0 pg   MCHC 31.7 30.0 - 36.0 g/dL   RDW 13.5 11.5 - 15.5 %   Platelets 376 150 - 400 K/uL   nRBC 0.0 0.0 - 0.2 %   DG Chest 2 View  Result Date: 09/30/2020 CLINICAL DATA:  Productive cough EXAM: CHEST - 2 VIEW COMPARISON:  None. FINDINGS: Diffuse bilateral interstitial thickening and alveolar airspace opacities. No focal consolidation. No pleural effusion or pneumothorax. Heart and mediastinal contours are unremarkable. No acute osseous abnormality. IMPRESSION: Diffuse bilateral interstitial and alveolar airspace opacities which may reflect pulmonary edema versus multilobar pneumonia. Electronically Signed   By: Kathreen Devoid   On: 09/30/2020 15:15    EKG EKG Interpretation  Date/Time:  Sunday Sep 30 2020 14:31:57 EDT Ventricular Rate:  94 PR Interval:  150 QRS Duration: 90 QT Interval:  362 QTC Calculation: 452 R Axis:   81 Text Interpretation: Normal sinus rhythm Non-specific ST-t changes Confirmed by Lajean Saver [phone]) on 09/30/2020 4:50:36 PM   Radiology DG Chest 2 View  Result Date: 09/30/2020 CLINICAL DATA:  Productive cough EXAM: CHEST - 2 VIEW COMPARISON:  None. FINDINGS: Diffuse bilateral interstitial thickening and alveolar airspace opacities. No focal consolidation. No pleural effusion or pneumothorax. Heart and mediastinal contours are unremarkable. No acute osseous abnormality. IMPRESSION: Diffuse bilateral interstitial and alveolar airspace opacities which may reflect pulmonary edema versus multilobar pneumonia. Electronically Signed   By: Kathreen Devoid   On: 09/30/2020 15:15    Procedures Procedures   Medications Ordered in ED Medications  tuberculin injection 5 Units (5 Units Intradermal Given 09/30/20 1851)  cefTRIAXone (ROCEPHIN) 1 g in sodium chloride  0.9 % 100 mL IVPB (has no administration in time range)  azithromycin (ZITHROMAX) 500 mg in sodium chloride 0.9 % 250 mL IVPB (has no administration in time range)  sodium chloride flush (NS) 0.9 % injection 3 mL (3 mLs Intravenous Given 09/30/20 1823)    ED Course  I have reviewed the triage vital signs and the nursing notes.  Pertinent labs & imaging results that were available during my care of the patient were reviewed by me and considered in my medical decision making (see chart for details).    MDM Rules/Calculators/A&P                         Iv ns. Labs sent. Cxr.   Reviewed nursing notes and prior charts for additional history.  It appears remote/prior hiv test positive (2019) - pt denies knowledge of same.  Labs reviewed/interpreted by me -  Wbc and chem normal.   CXR reviewed/interpreted by me - bil infiltrate.  Additional labs/cultures sent.   Iv abx given.   ID consulted - discussed w md on call - he indicates to admit to medicine, cover for cap and pneumocystis, and to have admitting team place formal consult to ID in AM tomorrow.      Final Clinical Impression(s) / ED Diagnoses Final diagnoses:  None    Rx / DC Orders ED Discharge Orders    None       Lajean Saver, MD 09/30/20 1911

## 2020-09-30 NOTE — ED Triage Notes (Addendum)
C/o productive cough with clear phlegm and 20 lb weight loss in 1 1/2 months.  States he has intermittent lightheadedness and SOB.

## 2020-09-30 NOTE — H&P (Signed)
History and Physical    Steven Johnston YCX:[id number] DOB: [birth year] DOA: 09/30/2020  PCP: Patient, No Pcp Per (Inactive)  Patient coming from: Home  I have personally briefly reviewed patient's old medical records in [place]  Chief Complaint: Cough  HPI: Steven Johnston is a 60 y.o. male who presents to the ED with c/o 1-2 months of cough, productive of clear phlegm, 20lb wt loss, generalized weakness, SOB, night sweats, subjective fevers.  Symptoms moderate, persistent, progressively worsening.  No known ill contacts.  Back in 2019 when pt was admitted for hernia repair, he apparently had an HIV1 AB test that came back positive.  Looks like a telephone note was made a few days after discharge that this information was sent to [facility] to "track patient down and bring to care" for this.  But per patient he never was informed.   ED Course: Sating 85% on RA, nl WBC, CXR with multifocal PNA vs pulm edema.  Trop neg  COVID and flu neg.  EDP spoke with ID.  Pt started on prednisone, bactrim IV, rocephin and azithro.   Review of Systems: As per HPI, otherwise all review of systems negative.  Past Medical History:  Diagnosis Date  . Hernia, inguinal, right     Past Surgical History:  Procedure Laterality Date  . INGUINAL HERNIA REPAIR Right 02/17/2018   Procedure: OPEN HERNIA REPAIR RIGHT INGUINAL INCARCERATED WITH DIAGONSTIC LAPAROSCOPY FOR INCARCERATED HERNIA;  Surgeon: Ileana Roup, MD;  Location: [facility];  Service: General;  Laterality: Right;     reports that he has been smoking. He has been smoking about 1.00 pack per day. He has never used smokeless tobacco. He reports current alcohol use. He reports that he does not use drugs.  No Known Allergies  Family History  Problem Relation Age of Onset  . Cancer Brother   . Immunodeficiency Neg Hx      Prior to Admission medications   Medication Sig Start Date End Date Taking? Authorizing Provider  albuterol  (VENTOLIN HFA) 108 (90 Base) MCG/ACT inhaler Inhale 1-2 puffs into the lungs every 6 (six) hours as needed for wheezing or shortness of breath. 08/12/20   Hazel Sams, PA-C  ibuprofen (ADVIL) 600 MG tablet Take 1 tablet (600 mg total) by mouth every 6 (six) hours as needed. 03/08/20   Lamptey, Myrene Galas, MD  predniSONE (STERAPRED UNI-PAK 21 TAB) 10 MG (21) TBPK tablet Take by mouth daily. Take 6 tabs by mouth daily  for 2 days, then 5 tabs for 2 days, then 4 tabs for 2 days, then 3 tabs for 2 days, 2 tabs for 2 days, then 1 tab by mouth daily for 2 days 08/12/20   Hazel Sams, PA-C  SUMAtriptan (IMITREX) 25 MG tablet Take 1 tablet (25 mg total) by mouth once for 1 dose. May repeat in 2 hours if headache persists or recurs. 03/08/20 03/08/20  LampteyMyrene Galas, MD  traMADol (ULTRAM) 50 MG tablet Take 1 tablet (50 mg total) by mouth every 6 (six) hours as needed (pain not controlled with ibuprofen). 02/18/18   Kalman Drape, PA    Physical Exam: Vitals:   09/30/20 1815 09/30/20 1830 09/30/20 1845 09/30/20 1900  BP: 112/77 110/78 (!) 142/98 (!) 125/94  Pulse: 79 77 90 81  Resp: (!) 28 (!) 26 (!) 23 17  Temp:      TempSrc:      SpO2: (!) 89% 91% (!) 86% (!) 88%  Weight:  Height:        Constitutional: NAD, calm, comfortable Eyes: PERRL, lids and conjunctivae normal ENMT: Mucous membranes are moist. Posterior pharynx clear of any exudate or lesions.Normal dentition. Temporal muscle wasting. Neck: normal, supple, no masses, no thyromegaly Respiratory: clear to auscultation bilaterally, no wheezing, no crackles. Normal respiratory effort. No accessory muscle use.  Cardiovascular: Regular rate and rhythm, no murmurs / rubs / gallops. No extremity edema. 2+ pedal pulses. No carotid bruits.  Abdomen: no tenderness, no masses palpated. No hepatosplenomegaly. Bowel sounds positive.  Musculoskeletal: no clubbing / cyanosis. No joint deformity upper and lower extremities. Good ROM, no  contractures. Normal muscle tone.  Skin: no rashes, lesions, ulcers. No induration Neurologic: CN 2-12 grossly intact. Sensation intact, DTR normal. Strength 5/5 in all 4.  Psychiatric: Normal judgment and insight. Alert and oriented x 3. Normal mood.    Labs on Admission: I have personally reviewed following labs and imaging studies  CBC: Recent Labs  Lab 09/30/20 1427  WBC 6.1  HGB 13.2  HCT 41.6  MCV 84.7  PLT 734   Basic Metabolic Panel: Recent Labs  Lab 09/30/20 1427  NA 136  K 4.3  CL 106  CO2 24  GLUCOSE 82  BUN 14  CREATININE 0.84  CALCIUM 8.4*   GFR: Estimated Creatinine Clearance: 84 mL/min (by C-G formula based on SCr of 0.84 mg/dL). Liver Function Tests: No results for input(s): AST, ALT, ALKPHOS, BILITOT, PROT, ALBUMIN in the last 168 hours. No results for input(s): LIPASE, AMYLASE in the last 168 hours. No results for input(s): AMMONIA in the last 168 hours. Coagulation Profile: No results for input(s): INR, PROTIME in the last 168 hours. Cardiac Enzymes: No results for input(s): CKTOTAL, CKMB, CKMBINDEX, TROPONINI in the last 168 hours. BNP (last 3 results) No results for input(s): PROBNP in the last 8760 hours. HbA1C: No results for input(s): HGBA1C in the last 72 hours. CBG: No results for input(s): GLUCAP in the last 168 hours. Lipid Profile: No results for input(s): CHOL, HDL, LDLCALC, TRIG, CHOLHDL, LDLDIRECT in the last 72 hours. Thyroid Function Tests: No results for input(s): TSH, T4TOTAL, FREET4, T3FREE, THYROIDAB in the last 72 hours. Anemia Panel: No results for input(s): VITAMINB12, FOLATE, FERRITIN, TIBC, IRON, RETICCTPCT in the last 72 hours. Urine analysis:    Component Value Date/Time   COLORURINE AMBER (A) 07/06/2013 0453   APPEARANCEUR CLOUDY (A) 07/06/2013 0453   LABSPEC 1.027 07/06/2013 0453   PHURINE 6.0 07/06/2013 0453   GLUCOSEU NEGATIVE 07/06/2013 0453   HGBUR NEGATIVE 07/06/2013 0453   BILIRUBINUR MODERATE (A)  07/06/2013 0453   KETONESUR 15 (A) 07/06/2013 0453   PROTEINUR 100 (A) 07/06/2013 0453   UROBILINOGEN 0.2 07/06/2013 0453   NITRITE NEGATIVE 07/06/2013 0453   LEUKOCYTESUR MODERATE (A) 07/06/2013 0453    Radiological Exams on Admission: DG Chest 2 View  Result Date: 09/30/2020 CLINICAL DATA:  Productive cough EXAM: CHEST - 2 VIEW COMPARISON:  None. FINDINGS: Diffuse bilateral interstitial thickening and alveolar airspace opacities. No focal consolidation. No pleural effusion or pneumothorax. Heart and mediastinal contours are unremarkable. No acute osseous abnormality. IMPRESSION: Diffuse bilateral interstitial and alveolar airspace opacities which may reflect pulmonary edema versus multilobar pneumonia. Electronically Signed   By: Kathreen Devoid   On: 09/30/2020 15:15    EKG: Independently reviewed.  Assessment/Plan Principal Problem:   Bilateral pneumonia Active Problems:   HIV disease ([place])   Acute respiratory failure with hypoxia (HCC)    1. B PNA - with acute  resp failure with hypoxia 1. Per ID: 1. Admit, treat for CAP + PJP for tonight 2. Get formal ID consult in AM 2. PNA pathway 3. O2 via [place] 4. Cont pulse ox 5. Tele monitor 6. Rocephin, azithro, and IV bactrim 7. Prednisone 40mg  BID 2. HIV - suspect AIDS 1. Check CD4 2. Check viral load 3. Did discuss how HIV was spread, risk that girlfriend of 11 years may also be infected 4. Pt would like to confirm diagnosis before discussing with girlfriend  DVT prophylaxis: Lovenox Code Status: Full Family Communication: No family in room, pt would like to confirm diagnosis before discussing with his girlfriend of 11 years. Disposition Plan: None  Consults called: EDP spoke with ID Admission status: Admit to inpatient  Severity of Illness: The appropriate patient status for this patient is INPATIENT. Inpatient status is judged to be reasonable and necessary in order to provide the required intensity of service to ensure the  patient's safety. The patient's presenting symptoms, physical exam findings, and initial radiographic and laboratory data in the context of their chronic comorbidities is felt to place them at high risk for further clinical deterioration. Furthermore, it is not anticipated that the patient will be medically stable for discharge from the hospital within 2 midnights of admission. The following factors support the patient status of inpatient.   Patient has acute respiratory failure with hypoxia due to having a new oxygen requirement.  That is the patient has a PaO2 < 60 (pulse Ox < 90%) on room air.  PNA, possibly PJP with new O2 requirement.  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   * I certify that at the point of admission it is my clinical judgment that the patient will require inpatient hospital care spanning beyond 2 midnights from the point of admission due to high intensity of service, high risk for further deterioration and high frequency of surveillance required.*    Omero Kowal M. DO [facility]  How to contact the [facility] Attending or Consulting provider [place] or covering provider during after hours [place], for this patient?  1. Check the care team in [facility] and look for a) attending/consulting [facility] provider listed and b) the [facility] team listed 2. Log into [email or web address]  Amion Physician Scheduling and messaging for groups and whole hospitals  On call and physician scheduling software for group practices, residents, hospitalists and other medical providers for call, clinic, rotation and shift schedules. On[employer] is a hospital-wide system for scheduling doctors and paging doctors on call. EasyPlot is for scientific plotting and data analysis.  [email or web address]  and use [place]'s universal password to access. If you do not have the password, please contact the hospital operator.  3. Locate the [facility] provider you are looking for under [facility] and page to a number that you can be  directly reached. 4. If you still have difficulty reaching the provider, please page the [facility] (Director on Call) for the Hospitalists listed on amion for assistance.  09/30/2020, 8:13 PM

## 2020-09-30 NOTE — Progress Notes (Signed)
Pharmacy Antibiotic Note  Steven Johnston is a 60 y.o. male admitted on 09/30/2020 with pneumonia.  Pharmacy has been consulted for bactrim dosing. Pt is afebrile and WBC is WNL. SCr is WNL.   Plan: Bactrim ~5mg /kg IV Q8H F/u renal fxn, C&S, clinical status  Height: 5\' 9"  (175.3 cm) Weight: 63.5 kg (140 lb) IBW/kg (Calculated) : 70.7  Temp (24hrs), Avg:98.9 F (37.2 C), Min:98.9 F (37.2 C), Max:98.9 F (37.2 C)  Recent Labs  Lab 09/30/20 1427  WBC 6.1  CREATININE 0.84    Estimated Creatinine Clearance: 84 mL/min (by C-G formula based on SCr of 0.84 mg/dL).    No Known Allergies  Antimicrobials this admission: Bactrim 5/1>> Azithro 5/1>> CTX 5/1>>  Dose adjustments this admission: N/A  Microbiology results: Pending  Thank you for allowing pharmacy to be a part of this patient's care.  Taylour Lietzke, Rande Lawman 09/30/2020 7:27 PM

## 2020-10-01 ENCOUNTER — Inpatient Hospital Stay (HOSPITAL_COMMUNITY): Payer: Self-pay

## 2020-10-01 DIAGNOSIS — J849 Interstitial pulmonary disease, unspecified: Secondary | ICD-10-CM

## 2020-10-01 DIAGNOSIS — R61 Generalized hyperhidrosis: Secondary | ICD-10-CM

## 2020-10-01 LAB — CBC
HCT: 37.7 % — ABNORMAL LOW (ref 39.0–52.0)
Hemoglobin: 12 g/dL — ABNORMAL LOW (ref 13.0–17.0)
MCH: 26.7 pg (ref 26.0–34.0)
MCHC: 31.8 g/dL (ref 30.0–36.0)
MCV: 84 fL (ref 80.0–100.0)
Platelets: 359 10*3/uL (ref 150–400)
RBC: 4.49 MIL/uL (ref 4.22–5.81)
RDW: 13.6 % (ref 11.5–15.5)
WBC: 5.1 10*3/uL (ref 4.0–10.5)
nRBC: 0 % (ref 0.0–0.2)

## 2020-10-01 LAB — HIV ANTIBODY (ROUTINE TESTING W REFLEX): HIV Screen 4th Generation wRfx: REACTIVE — AB

## 2020-10-01 LAB — BLOOD GAS, ARTERIAL
Acid-base deficit: 2.9 mmol/L — ABNORMAL HIGH (ref 0.0–2.0)
Bicarbonate: 20.8 mmol/L (ref 20.0–28.0)
Drawn by: 519031
FIO2: 28
O2 Saturation: 85.5 %
Patient temperature: 37
pCO2 arterial: 32.8 mmHg (ref 32.0–48.0)
pH, Arterial: 7.42 (ref 7.350–7.450)
pO2, Arterial: 53.4 mmHg — ABNORMAL LOW (ref 83.0–108.0)

## 2020-10-01 LAB — COMPREHENSIVE METABOLIC PANEL
ALT: 8 U/L (ref 0–44)
AST: 18 U/L (ref 15–41)
Albumin: 2.2 g/dL — ABNORMAL LOW (ref 3.5–5.0)
Alkaline Phosphatase: 155 U/L — ABNORMAL HIGH (ref 38–126)
Anion gap: 9 (ref 5–15)
BUN: 13 mg/dL (ref 6–20)
CO2: 22 mmol/L (ref 22–32)
Calcium: 8.2 mg/dL — ABNORMAL LOW (ref 8.9–10.3)
Chloride: 105 mmol/L (ref 98–111)
Creatinine, Ser: 0.77 mg/dL (ref 0.61–1.24)
GFR, Estimated: >60 mL/min (ref >60)
Glucose, Bld: 176 mg/dL — ABNORMAL HIGH (ref 70–99)
Potassium: 4 mmol/L (ref 3.5–5.1)
Sodium: 136 mmol/L (ref 135–145)
Total Bilirubin: 0.4 mg/dL (ref 0.3–1.2)
Total Protein: 7.2 g/dL (ref 6.5–8.1)

## 2020-10-01 LAB — T-HELPER CELLS (CD4) COUNT (NOT AT ARMC)
CD4 % Helper T Cell: 5 % — ABNORMAL LOW (ref 33–65)
CD4 T Cell Abs: 39 /uL — ABNORMAL LOW (ref 400–1790)

## 2020-10-01 LAB — CRYPTOCOCCAL ANTIGEN: Crypto Ag: NEGATIVE

## 2020-10-01 LAB — GLUCOSE, CAPILLARY
Glucose-Capillary: 100 mg/dL — ABNORMAL HIGH (ref 70–99)
Glucose-Capillary: 160 mg/dL — ABNORMAL HIGH (ref 70–99)
Glucose-Capillary: 134 mg/dL — ABNORMAL HIGH (ref 70–99)

## 2020-10-01 LAB — CULTURE, BLOOD (ROUTINE X 2)

## 2020-10-01 LAB — LACTATE DEHYDROGENASE: LDH: 292 U/L — ABNORMAL HIGH (ref 98–192)

## 2020-10-01 MED ORDER — IOHEXOL 300 MG/ML  SOLN
75.0000 mL | Freq: Once | INTRAMUSCULAR | Status: AC | PRN
  Administered 2020-10-01: 75 mL via INTRAVENOUS

## 2020-10-01 NOTE — Progress Notes (Signed)
PROGRESS NOTE    Steven Johnston  JKD:[id number] DOB: 05-May-1961 DOA: 09/30/2020 PCP: Patient, No Pcp Per (Inactive)    Brief Narrative:  Steven Johnston is a 60 year old male with no significant previous medical history who presented to [facility] ED on 09/30/2020 with complaints of cough productive of clear sputum, 20 pound weight loss, generalized weakness, shortness of breath, night sweats and subjective fevers.  Symptoms have been progressing over the last 2 months.  No known sick contacts.  No chronic medications or changes in dietary habits.  Patient was previously admitted in 2019 for hernia repair, HIV-1 antibody test at that time came back positive on 02/18/2018.  Patient was discharged home by general surgery.  A telephone note by infectious disease per Dr. Linus Salmons that information was sent to [facility] to track patient down and bring to care, but patient's did not receive notification and subsequently was lost to follow-up and was unaware of this diagnosis until this admission.  In the ED, temperature 98.9 F, HR 95, RR 25, BP 111/76, SPO2 86% on room air.  Sodium 136, potassium 4.3, chloride 106, CO2 24, glucose 82, BUN 14, creatinine 0.84, BNP 7.0.  WBC 6.1, hemoglobin 13.2, platelets 376.  COVID-19 PCR negative.  Influenza A/B PCR negative.  Blood cultures x2 ordered.  Chest x-ray with diffuse bilateral interstitial and alveolar airspace opacities consistent with multifocal pneumonia.  HIV reactive.  EDP consulted infectious disease who requested medicine admission.  Hospital service consulted for further evaluation management of acute hypoxic respiratory failure secondary to multifocal pneumonia concerning for CAP versus pneumocystis given untreated HIV.   Assessment & Plan:   Principal Problem:   Bilateral pneumonia Active Problems:   HIV disease ([place])   Acute respiratory failure with hypoxia ([place])   Acute hypoxic respiratory failure, POA Multifocal pneumonia concerning for CAP versus  pneumocystis Patient presenting to ED with progressive shortness of breath, weight loss, generalized weakness, fevers/chills over the last 1-2 months with cough with clear sputum.  No sick contacts.  Was noted to have positive HIV antibody test 2019, but lost to follow-up and unaware of this diagnosis.  Patient is afebrile without leukocytosis but chest x-ray with findings significant for multi focal pneumonia.  BNP within normal limits.  COVID-19/influenza A/B PCR negative.  Concern for CAP versus pneumocystis pneumonia. --Infectious disease consulted --Blood cultures x2: Pending --QuantiFERON-TB Gold: Pending --Sputum culture: Pending --Numerous missed smear by DFA: Pending --Azithromycin 500 mg IV every 24 hours --Ceftriaxone 2 g IV every 24 hours --Bactrim 320 mg IV every 8 hours -- Continue supplemental oxygen, maintain SPO2 greater than 92%, currently on 2 L nasal cannula -- supportive care  HIV Previously noted to have positive HIV antibody 2019, lost to follow-up.  Reports 20 pound weight loss last 1-2 months with associated generalized weakness, productive cough.  HIV antibody remains reactive. --ID consulted as above --HIV-1 RNA PCR/genotype: Pending --Further per ID  Severe protein calorie malnutrition Body mass index is 20.67 kg/m.  Patient reports unintentional 20 pound weight loss over the last 2 months.  Significant muscle mass deterioration and fat depletion noted on physical exam. --Nutrition consult   DVT prophylaxis: Lovenox   Code Status: Full Code Family Communication: No family present at bedside this morning  Disposition Plan:  Level of care: Telemetry Medical Status is: Inpatient  Remains inpatient appropriate because:Unsafe d/c plan, IV treatments appropriate due to intensity of illness or inability to take PO and Inpatient level of care appropriate due to severity of illness  Dispo: The patient is from: Home              Anticipated d/c is to: Home               Patient currently is not medically stable to d/c.   Difficult to place patient No  Consultants:   Infectious disease  Procedures:   None  Antimicrobials:   Azithromycin 5/1>>  Ceftriaxone 5/1>>  Bactrim 5/1>>   Subjective: Patient seen and examined bedside, resting comfortably.  Continues with shortness of breath and productive cough of clear sputum.  Remains on 2 L nasal cannula.  Concerned why he did not know about his HIV diagnosis when his test was positive in 2019.  No other complaints or concerns at this time.  Denies headache, no visual changes, no chest pain, no palpitations, no abdominal pain.  No acute events overnight per nursing staff.  Objective: Vitals:   09/30/20 2115 09/30/20 2215 09/30/20 2238 10/01/20 0522  BP: 107/77 101/71 116/85 100/71  Pulse: 82 81 83 68  Resp: (!) 29 (!) 24 20 19   Temp:   98.9 F (37.2 C) 98 F (36.7 C)  TempSrc:   Oral   SpO2: 98% 97% 97% 97%  Weight:      Height:        Intake/Output Summary (Last 24 hours) at 10/01/2020 0930 Last data filed at 10/01/2020 0900 Gross per 24 hour  Intake --  Output 400 ml  Net -400 ml   Filed Weights   09/30/20 1807  Weight: 63.5 kg    Examination:  General exam: Appears calm and comfortable, thin/cachectic in appearance with notable muscle wasting and fat depletion Respiratory system: Clear to auscultation. Respiratory effort normal.  On 2 L nasal cannula with SPO2 93% Cardiovascular system: S1 & S2 heard, RRR. No JVD, murmurs, rubs, gallops or clicks. No pedal edema. Gastrointestinal system: Abdomen is nondistended, soft and nontender. No organomegaly or masses felt. Normal bowel sounds heard. Central nervous system: Alert and oriented. No focal neurological deficits. Extremities: Symmetric 5 x 5 power. Skin: No rashes, lesions or ulcers Psychiatry: Judgement and insight appear normal. Mood & affect appropriate.     Data Reviewed: I have personally reviewed following labs  and imaging studies  CBC: Recent Labs  Lab 09/30/20 1427 10/01/20 0220  WBC 6.1 5.1  HGB 13.2 12.0*  HCT 41.6 37.7*  MCV 84.7 84.0  PLT 376 960   Basic Metabolic Panel: Recent Labs  Lab 09/30/20 1427 10/01/20 0220  NA 136 136  K 4.3 4.0  CL 106 105  CO2 24 22  GLUCOSE 82 176*  BUN 14 13  CREATININE 0.84 0.77  CALCIUM 8.4* 8.2*   GFR: Estimated Creatinine Clearance: 88.2 mL/min (by C-G formula based on SCr of 0.77 mg/dL). Liver Function Tests: Recent Labs  Lab 10/01/20 0220  AST 18  ALT 8  ALKPHOS 155*  BILITOT 0.4  PROT 7.2  ALBUMIN 2.2*   No results for input(s): LIPASE, AMYLASE in the last 168 hours. No results for input(s): AMMONIA in the last 168 hours. Coagulation Profile: No results for input(s): INR, PROTIME in the last 168 hours. Cardiac Enzymes: No results for input(s): CKTOTAL, CKMB, CKMBINDEX, TROPONINI in the last 168 hours. BNP (last 3 results) No results for input(s): PROBNP in the last 8760 hours. HbA1C: No results for input(s): HGBA1C in the last 72 hours. CBG: Recent Labs  Lab 10/01/20 0800  GLUCAP 100*   Lipid Profile: No results for input(s):  CHOL, HDL, LDLCALC, TRIG, CHOLHDL, LDLDIRECT in the last 72 hours. Thyroid Function Tests: No results for input(s): TSH, T4TOTAL, FREET4, T3FREE, THYROIDAB in the last 72 hours. Anemia Panel: No results for input(s): VITAMINB12, FOLATE, FERRITIN, TIBC, IRON, RETICCTPCT in the last 72 hours. Sepsis Labs: No results for input(s): PROCALCITON, LATICACIDVEN in the last 168 hours.  Recent Results (from the past 240 hour(s))  Blood culture (routine x 2)     Status: None (Preliminary result)   Collection Time: 09/30/20  1:34 PM   Specimen: BLOOD  Result Value Ref Range Status   Specimen Description BLOOD SITE NOT SPECIFIED  Final   Special Requests   Final    BOTTLES DRAWN AEROBIC AND ANAEROBIC Blood Culture results may not be optimal due to an inadequate volume of blood received in culture  bottles   Culture   Final    NO GROWTH < 24 HOURS Performed at [facility], [place] [address] [place], [place] [address]    Report Status PENDING  Incomplete  Blood culture (routine x 2)     Status: None (Preliminary result)   Collection Time: 09/30/20  6:20 PM   Specimen: BLOOD  Result Value Ref Range Status   Specimen Description BLOOD SITE NOT SPECIFIED  Final   Special Requests   Final    BOTTLES DRAWN AEROBIC AND ANAEROBIC Blood Culture results may not be optimal due to an inadequate volume of blood received in culture bottles   Culture   Final    NO GROWTH < 24 HOURS Performed at [facility], [place] [address], [place], [place] [address]    Report Status PENDING  Incomplete  Resp Panel by RT-PCR (Flu A&B, Covid) Nasopharyngeal Swab     Status: None   Collection Time: 09/30/20  6:23 PM   Specimen: Nasopharyngeal Swab; Nasopharyngeal(NP) swabs in vial transport medium  Result Value Ref Range Status   SARS Coronavirus 2 by RT PCR NEGATIVE NEGATIVE Final    Comment: (NOTE) SARS-CoV-2 target nucleic acids are NOT DETECTED.  The SARS-CoV-2 RNA is generally detectable in upper respiratory specimens during the acute phase of infection. The lowest concentration of SARS-CoV-2 viral copies this assay can detect is 138 copies/mL. A negative result does not preclude SARS-Cov-2 infection and should not be used as the sole basis for treatment or other patient management decisions. A negative result may occur with  improper specimen collection/handling, submission of specimen other than nasopharyngeal swab, presence of viral mutation(s) within the areas targeted by this assay, and inadequate number of viral copies(<138 copies/mL). A negative result must be combined with clinical observations, patient history, and epidemiological information. The expected result is Negative.  Fact Sheet for Patients:  EntrepreneurPulse.com.au  Fact Sheet for Healthcare  Providers:  IncredibleEmployment.be  This test is no t yet approved or cleared by the [place] FDA and  has been authorized for detection and/or diagnosis of SARS-CoV-2 by FDA under an Emergency Use Authorization (EUA). This EUA will remain  in effect (meaning this test can be used) for the duration of the COVID-19 declaration under Section 564(b)(1) of the Act, 21 U.S.C.section 360bbb-3(b)(1), unless the authorization is terminated  or revoked sooner.       Influenza A by PCR NEGATIVE NEGATIVE Final   Influenza B by PCR NEGATIVE NEGATIVE Final    Comment: (NOTE) The Xpert Xpress SARS-CoV-2/FLU/RSV plus assay is intended as an aid in the diagnosis of influenza from Nasopharyngeal swab specimens and should not be used  as a sole basis for treatment. Nasal washings and aspirates are unacceptable for Xpert Xpress SARS-CoV-2/FLU/RSV testing.  Fact Sheet for Patients: EntrepreneurPulse.com.au  Fact Sheet for Healthcare Providers: IncredibleEmployment.be  This test is not yet approved or cleared by the [place] FDA and has been authorized for detection and/or diagnosis of SARS-CoV-2 by FDA under an Emergency Use Authorization (EUA). This EUA will remain in effect (meaning this test can be used) for the duration of the COVID-19 declaration under Section 564(b)(1) of the Act, 21 U.S.C. section 360bbb-3(b)(1), unless the authorization is terminated or revoked.  Performed at [facility], [place] [address], [place], [place] [address]          Radiology Studies: DG Chest 2 View  Result Date: 09/30/2020 CLINICAL DATA:  Productive cough EXAM: CHEST - 2 VIEW COMPARISON:  None. FINDINGS: Diffuse bilateral interstitial thickening and alveolar airspace opacities. No focal consolidation. No pleural effusion or pneumothorax. Heart and mediastinal contours are unremarkable. No acute osseous abnormality. IMPRESSION: Diffuse  bilateral interstitial and alveolar airspace opacities which may reflect pulmonary edema versus multilobar pneumonia. Electronically Signed   By: Kathreen Devoid   On: 09/30/2020 15:15        Scheduled Meds: . enoxaparin (LOVENOX) injection  40 mg Subcutaneous Q24H  . predniSONE  40 mg Oral BID WC  . tuberculin  5 Units Intradermal Once   Continuous Infusions: . azithromycin    . cefTRIAXone (ROCEPHIN)  IV    . sulfamethoxazole-trimethoprim 320 mg (10/01/20 0529)     LOS: 1 day    Time spent: 39 minutes spent on chart review, discussion with nursing staff, consultants, updating family and interview/physical exam; more than 50% of that time was spent in counseling and/or coordination of care.    Castle Lamons J [place] DO [facility] Available via Epic secure chat 7am-7pm After these hours, please refer to coverage provider listed on [email or web address] 10/01/2020, 9:30 AM

## 2020-10-01 NOTE — Progress Notes (Signed)
RT NOTES: ABG obtained and sent to lab. Lab tech notified.  

## 2020-10-01 NOTE — Progress Notes (Signed)
RT went to get sputum but pt was unable to cough up anything. RN aware and will get the sample when pt is ready.

## 2020-10-01 NOTE — Progress Notes (Signed)
Pt. Complained of having some gas.

## 2020-10-01 NOTE — Consult Note (Signed)
[facility]                                                                                        Patient Identification: Patient Name: Steven Johnston MRN: [id number] [place] Date: 09/30/2020  2:04 PM Today's Date: 10/01/2020 Reason for consult: HIV/respiratory symptoms  Requesting provider: Eric [place]   Principal Problem:   Bilateral pneumonia Active Problems:   HIV disease ([place])   Acute respiratory failure with hypoxia (HCC)   Antibiotics: ceftriaxone 5/1 - current                    Azithromycin 5/1 - current                     Bactrim 5/1 - current   Lines/Tubes: PIVs   Assessment Chronic Cough/Exertional dyspnea. Chest xray with with diffuse bilateral interstitial and alveolar airspace opacities   Night Sweats/weight loss  HIV - not in care /ART    Recommendations  Continue ceftriaxone and azithromycin for CAP coverage now  Agree with Bactrim and Prednisone for possible PJP PNA. Fu ABG Fungitel. LDH, Histoplasma urine ag, cryptococcal ag Fu Strep pneumo ag, urine legionella ag and Respiratory full viral PCR panel Fu on HIV VL, CD4 and genotype Fu on Quantiferon/sputum cx and PJP DFA stain Fu blood cultures  Urine GC and RPR  Fu Hepatitis serology  Will also order sputum AFB smear/cx given h/o weight loss/night sweats and HIV not on ART Airborne precautions  CT chest  Will plan on starting ART soon  Following  Rest of the management as per the primary team. Please call with questions or concerns.  Thank you for the consult  Rosiland Oz, MD Infectious Disease Physician [facility] [address] [place], [place] [address] Phone: [phone]  Fax: [phone]  __________________________________________________________________________________________________________ HPI and Hospital Course: 60 year old male  past medical history of migraine headache, HIV ( diagnosed in September 2019, not in ART as of now) who presented to the 5/1 with complaints of chronic cough for 2 to 3 months treated with worsening exertional dyspnea.  Patient was seen in the ED 3/13 for cough for 1 month.  He was thought to have acute bronchitis and was started on prednisone to taper and albuterol.  However patient continued to have cough and also started developing exertional dyspnea.  Denies any fevers, had chills, also has drenching night sweats on and off for at least the last 1 month.  Appetite okay, also complains of weight loss (unsure how much).  Complains of on and off migraine headache and seeing black spots when he has an attack of migraine.  Denies any current headache, neck pain, back pain, acute weakness numbness or tingling.  Denies any GU symptoms.  Denies any rashes or joint pain.  Denies any nausea vomiting diarrhea and abdominal pain.  Denies any dysphagia or odynophagia.  He tells me he did not know about his HIV diagnosis until yesterday and has not been taking ART so far.  He is willing to be started on ART.  He  was born in [place], moved to [place] followed by Dr Buelah Manis and finally to [place] where he has been living for at least 36 years.  He was working in [employer] before he retired and currently works in Forensic psychologist..  He is sexually active with his girlfriend, denies any other sexual partners.  Last sexual activity with his girlfriend a month ago.  Girlfriend is unaware about his diagnosis of HIV.  Instructed him to talk to girlfriend regarding his HIV.  He lives with his girlfriend's family.  They have a cat at home.  Patient denies playing with a cat.   He traveled to [place] 2 months ago to help move his friend to [place].  Also had gone to [place] months ago denies any recent travel.  Smokes 2 to 3 cigarettes a day, down from 20 to 12 cigarettes a day, alcohol and moderate  amount, denies any IV drug use.  At ED, he was afebrile, no leukocytosis.  Chest x-ray with diffuse bilateral interstitial and alveolar airspace opacities.  BC and CMP unremarkable  ROS: Past Medical History:  Diagnosis Date  . Hernia, inguinal, right    Past Surgical History:  Procedure Laterality Date  . INGUINAL HERNIA REPAIR Right 02/17/2018   Procedure: OPEN HERNIA REPAIR RIGHT INGUINAL INCARCERATED WITH DIAGONSTIC LAPAROSCOPY FOR INCARCERATED HERNIA;  Surgeon: Ileana Roup, MD;  Location: [facility];  Service: General;  Laterality: Right;     Scheduled Meds: . enoxaparin (LOVENOX) injection  40 mg Subcutaneous Q24H  . predniSONE  40 mg Oral BID WC  . tuberculin  5 Units Intradermal Once   Continuous Infusions: . azithromycin    . cefTRIAXone (ROCEPHIN)  IV    . sulfamethoxazole-trimethoprim 320 mg (10/01/20 0529)   PRN Meds:.  No Known Allergies  Social History   Socioeconomic History  . Marital status: Single    Spouse name: Not on file  . Number of children: Not on file  . Years of education: Not on file  . Highest education level: Not on file  Occupational History  . Not on file  Tobacco Use  . Smoking status: Current Every Day Smoker    Packs/day: 1.00  . Smokeless tobacco: Never Used  Substance and Sexual Activity  . Alcohol use: Yes  . Drug use: No  . Sexual activity: Not on file  Other Topics Concern  . Not on file  Social History Narrative   ** Merged History Encounter **       Social Determinants of Health   Financial Resource Strain: Not on file  Food Insecurity: Not on file  Transportation Needs: Not on file  Physical Activity: Not on file  Stress: Not on file  Social Connections: Not on file  Intimate Partner Violence: Not on file    Vitals BP 100/71 (BP Location: Left Arm)   Pulse 68   Temp 98 F (36.7 C)   Resp 19   Ht 5\' 9"  (1.753 m)   Wt 63.5 kg   SpO2 97%   BMI 20.67 kg/m   Physical Exam Constitutional: on nasal  cannula, mild respiratory distress      Comments: no oral thrush   Cardiovascular:     Rate and Rhythm: Normal rate and regular rhythm.     Heart sounds: No murmur heard.   Pulmonary:     Effort: Pulmonary effort is normal.     Comments:   Abdominal:     Palpations: Abdomen is soft.  Tenderness:   Musculoskeletal:        General: No swelling or tenderness.   Skin:    Comments: no obvious lesions or rashes   Neurological:     General: No focal deficit present.   Psychiatric:        Mood and Affect: Mood normal.    LINES/TUBES:  METAL IMPLANT/HARDWARE:  Pertinent Microbiology Results for orders placed or performed during the hospital encounter of 09/30/20  Blood culture (routine x 2)     Status: None (Preliminary result)   Collection Time: 09/30/20  1:34 PM   Specimen: BLOOD  Result Value Ref Range Status   Specimen Description BLOOD SITE NOT SPECIFIED  Final   Special Requests   Final    BOTTLES DRAWN AEROBIC AND ANAEROBIC Blood Culture results may not be optimal due to an inadequate volume of blood received in culture bottles   Culture   Final    NO GROWTH < 24 HOURS Performed at [facility], [place] [address] [place], [place] [address]    Report Status PENDING  Incomplete  Blood culture (routine x 2)     Status: None (Preliminary result)   Collection Time: 09/30/20  6:20 PM   Specimen: BLOOD  Result Value Ref Range Status   Specimen Description BLOOD SITE NOT SPECIFIED  Final   Special Requests   Final    BOTTLES DRAWN AEROBIC AND ANAEROBIC Blood Culture results may not be optimal due to an inadequate volume of blood received in culture bottles   Culture   Final    NO GROWTH < 24 HOURS Performed at [facility], [place] [address], [place], [place] [address]    Report Status PENDING  Incomplete  Resp Panel by RT-PCR (Flu A&B, Covid) Nasopharyngeal Swab     Status: None   Collection Time: 09/30/20  6:23 PM   Specimen: Nasopharyngeal Swab;  Nasopharyngeal(NP) swabs in vial transport medium  Result Value Ref Range Status   SARS Coronavirus 2 by RT PCR NEGATIVE NEGATIVE Final    Comment: (NOTE) SARS-CoV-2 target nucleic acids are NOT DETECTED.  The SARS-CoV-2 RNA is generally detectable in upper respiratory specimens during the acute phase of infection. The lowest concentration of SARS-CoV-2 viral copies this assay can detect is 138 copies/mL. A negative result does not preclude SARS-Cov-2 infection and should not be used as the sole basis for treatment or other patient management decisions. A negative result may occur with  improper specimen collection/handling, submission of specimen other than nasopharyngeal swab, presence of viral mutation(s) within the areas targeted by this assay, and inadequate number of viral copies(<138 copies/mL). A negative result must be combined with clinical observations, patient history, and epidemiological information. The expected result is Negative.  Fact Sheet for Patients:  EntrepreneurPulse.com.au  Fact Sheet for Healthcare Providers:  IncredibleEmployment.be  This test is no t yet approved or cleared by the [place] FDA and  has been authorized for detection and/or diagnosis of SARS-CoV-2 by FDA under an Emergency Use Authorization (EUA). This EUA will remain  in effect (meaning this test can be used) for the duration of the COVID-19 declaration under Section 564(b)(1) of the Act, 21 U.S.C.section 360bbb-3(b)(1), unless the authorization is terminated  or revoked sooner.       Influenza A by PCR NEGATIVE NEGATIVE Final   Influenza B by PCR NEGATIVE NEGATIVE Final    Comment: (NOTE) The Xpert Xpress SARS-CoV-2/FLU/RSV plus assay is intended as an aid in the diagnosis of  influenza from Nasopharyngeal swab specimens and should not be used as a sole basis for treatment. Nasal washings and aspirates are unacceptable for Xpert Xpress  SARS-CoV-2/FLU/RSV testing.  Fact Sheet for Patients: EntrepreneurPulse.com.au  Fact Sheet for Healthcare Providers: IncredibleEmployment.be  This test is not yet approved or cleared by the [place] FDA and has been authorized for detection and/or diagnosis of SARS-CoV-2 by FDA under an Emergency Use Authorization (EUA). This EUA will remain in effect (meaning this test can be used) for the duration of the COVID-19 declaration under Section 564(b)(1) of the Act, 21 U.S.C. section 360bbb-3(b)(1), unless the authorization is terminated or revoked.  Performed at [facility], [place] [address] [place], [place] [address]     Pertinent Lab seen by me: CBC Latest Ref Rng & Units 10/01/2020 09/30/2020 02/18/2018  WBC 4.0 - 10.5 K/uL 5.1 6.1 6.9  Hemoglobin 13.0 - 17.0 g/dL 12.0(L) 13.2 12.8(L)  Hematocrit 39.0 - 52.0 % 37.7(L) 41.6 40.1  Platelets 150 - 400 K/uL 359 376 259   CMP Latest Ref Rng & Units 10/01/2020 09/30/2020 02/18/2018  Glucose 70 - 99 mg/dL 176(H) 82 95  BUN 6 - 20 mg/dL 13 14 8   Creatinine 0.61 - 1.24 mg/dL 0.77 0.84 1.06  Sodium 135 - 145 mmol/L 136 136 136  Potassium 3.5 - 5.1 mmol/L 4.0 4.3 3.7  Chloride 98 - 111 mmol/L 105 106 105  CO2 22 - 32 mmol/L 22 24 26   Calcium 8.9 - 10.3 mg/dL 8.2(L) 8.4(L) 8.1(L)  Total Protein 6.5 - 8.1 g/dL 7.2 - -  Total Bilirubin 0.3 - 1.2 mg/dL 0.4 - -  Alkaline Phos 38 - 126 U/L 155(H) - -  AST 15 - 41 U/L 18 - -  ALT 0 - 44 U/L 8 - -     Pertinent Imagings/Other Imagings Plain films and CT images have been personally visualized and interpreted; radiology reports have been reviewed. Decision making incorporated into the Impression / Recommendations.  Chest Xray 10/01/20 FINDINGS: Diffuse bilateral interstitial thickening and alveolar airspace opacities. No focal consolidation. No pleural effusion or pneumothorax. Heart and mediastinal contours are unremarkable.  No acute osseous  abnormality.  IMPRESSION: Diffuse bilateral interstitial and alveolar airspace opacities which may reflect pulmonary edema versus multilobar pneumonia.   I have spent more than 70  minutes for this patient encounter including review of prior medical records with greater than 50% of time being face to face and coordination of their care.  Electronically signed by:   Rosiland Oz, MD Infectious Disease Physician [facility] for Infectious Disease Pager: [phone]

## 2020-10-02 ENCOUNTER — Other Ambulatory Visit (HOSPITAL_COMMUNITY): Payer: Self-pay

## 2020-10-02 DIAGNOSIS — E43 Unspecified severe protein-calorie malnutrition: Secondary | ICD-10-CM

## 2020-10-02 LAB — BASIC METABOLIC PANEL
Anion gap: 9 (ref 5–15)
BUN: 10 mg/dL (ref 6–20)
CO2: 22 mmol/L (ref 22–32)
Calcium: 8.3 mg/dL — ABNORMAL LOW (ref 8.9–10.3)
Chloride: 106 mmol/L (ref 98–111)
Creatinine, Ser: 0.92 mg/dL (ref 0.61–1.24)
GFR, Estimated: >60 mL/min (ref >60)
Glucose, Bld: 78 mg/dL (ref 70–99)
Potassium: 4 mmol/L (ref 3.5–5.1)
Sodium: 137 mmol/L (ref 135–145)

## 2020-10-02 LAB — HEPATITIS C ANTIBODY: HCV Ab: NONREACTIVE

## 2020-10-02 LAB — HIV-1/2 AB - DIFFERENTIATION
HIV 1 Ab: REACTIVE — AB
HIV 2 Ab: NONREACTIVE

## 2020-10-02 LAB — CULTURE, BLOOD (ROUTINE X 2): Culture: NO GROWTH

## 2020-10-02 LAB — RPR: RPR Ser Ql: NONREACTIVE

## 2020-10-02 LAB — HEPATITIS B SURFACE ANTIGEN: Hepatitis B Surface Ag: NONREACTIVE

## 2020-10-02 LAB — HEPATITIS B CORE ANTIBODY, TOTAL: Hep B Core Total Ab: REACTIVE — AB

## 2020-10-02 MED ORDER — ENSURE ENLIVE PO LIQD
237.0000 mL | Freq: Three times a day (TID) | ORAL | Status: DC
  Administered 2020-10-02 – 2020-10-05 (×10): 237 mL via ORAL
  Filled 2020-10-02: qty 237

## 2020-10-02 MED ORDER — BICTEGRAVIR-EMTRICITAB-TENOFOV 50-200-25 MG PO TABS
1.0000 | ORAL_TABLET | Freq: Every day | ORAL | Status: DC
  Administered 2020-10-02 – 2020-10-06 (×5): 1 via ORAL
  Filled 2020-10-02 (×5): qty 1

## 2020-10-02 MED ORDER — PNEUMOCOCCAL VAC POLYVALENT 25 MCG/0.5ML IJ INJ
0.5000 mL | INJECTION | INTRAMUSCULAR | Status: DC | PRN
  Filled 2020-10-02: qty 0.5

## 2020-10-02 NOTE — Progress Notes (Signed)
Initial Nutrition Assessment  DOCUMENTATION CODES:   Not applicable  INTERVENTION:  Ensure Enlive po TID, each supplement provides 350 kcal and 20 grams of protein  NUTRITION DIAGNOSIS:   Increased nutrient needs related to chronic illness (HIV) as evidenced by estimated needs.  GOAL:   Patient will meet greater than or equal to 90% of their needs  MONITOR:   PO intake,Supplement acceptance,Labs,Weight trends,I & O's  REASON FOR ASSESSMENT:   Consult Assessment of nutrition requirement/status  ASSESSMENT:   Pt with PMH significant for HIV (pt reports being unaware of positive result from 2019; lost to follow-up) admitted with bilateral PNA.  Pt unavailable at time of RD visit. Per H&P, pt endorsed 1-2 month h/o cough, 20lb wt loss, generalized weakness, shortness of breath, night sweats, and fevers. Pt reports sx progressively worsening.   Per RN, pt with good appetite so far -- 75% meal intake. Will provide pt with oral nutrition supplements as suspect pt meets criteria for malnutrition given unknown dx of HIV and reported 20lb wt loss x1-2 months; however, unable to diagnose at this time without nutrition-focused physical exam. Will attempt at follow-up.  UOP: 1869ml x24 hours  Medications: deltasone Labs reviewed. CBGs 134-160  Diet Order:   Diet Order            Diet regular Room service appropriate? Yes; Fluid consistency: Thin  Diet effective now                 EDUCATION NEEDS:   No education needs have been identified at this time  Skin:  Skin Assessment: Reviewed RN Assessment  Last BM:  09/30/20  Height:   Ht Readings from Last 1 Encounters:  09/30/20 5\' 9"  (1.753 m)    Weight:   Wt Readings from Last 1 Encounters:  09/30/20 63.5 kg   BMI:  Body mass index is 20.67 kg/m.  Estimated Nutritional Needs:   Kcal:  1900-2100  Protein:  95-105 grams  Fluid:  >1.9L/d    Larkin Ina, MS, RD, LDN RD pager number and weekend/on-call  pager number located in [place].

## 2020-10-02 NOTE — Progress Notes (Signed)
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  Possible PJP pneumonia  Night sweats and weight loss Malnutrition  HIV AIDS, treatment nave, not on ART ( CD4 5% and 39)   Recommendations Complete ceftriaxone and azithromycin for total 5 days Continue Bactrim and prednisone, highly likely this is PCP pneumonia. Maintain adequate hydration and monitor BMP Fu additional labs/serology  Will need 2 additional sputum AFB smear and cx ( ordered ).  Patient is agreeable to start ART. No neurologic complaints. Will start Biktarvy 1 tab po daily.  Will also notify [facility] staff for RW and HMAP application  A follow up will be made with [facility] upon discharge   Rest of the management as per the primary team. Thank you for the consult. Please page with pertinent questions or concerns.  ______________________________________________________________________ Subjective patient seen and examined at the bedside. Denies any complaints. SOB is better. Denies nausea, vomiting, diarrhea   Vitals BP 114/68 (BP Location: Left Arm)   Pulse 87   Temp 97.8 F (36.6 C) (Oral)   Resp 20   Ht 5\' 9"  (1.753 m)   Wt 63.5 kg    SpO2 93%   BMI 20.67 kg/m     Physical Exam Constitutional:  Not in acute distress, on 2 L [place]    Comments:   Cardiovascular:     Rate and Rhythm: Normal rate and regular rhythm.     Heart sounds: No murmur heard.   Pulmonary:     Effort: Pulmonary effort is normal.     Comments: fine crackles +  Abdominal:     Palpations: Abdomen is soft.     Tenderness: Non tender   Musculoskeletal:        General: No swelling or tenderness.   Skin:    Comments: No obvious rashes   Neurological:     General: No focal deficit present.   Psychiatric:        Mood and Affect: Mood normal.   Pertinent Microbiology Results for orders placed or performed during the hospital encounter of 09/30/20  Blood culture (routine x 2)     Status: None (Preliminary result)   Collection Time: 09/30/20  1:34 PM   Specimen: BLOOD  Result Value Ref Range Status   Specimen Description BLOOD SITE NOT SPECIFIED  Final   Special Requests   Final    BOTTLES DRAWN AEROBIC AND ANAEROBIC Blood Culture results may not be optimal due to an inadequate volume of blood received in culture bottles   Culture   Final    NO GROWTH < 24 HOURS Performed at [facility],  [address] [place], [place] [address]    Report Status PENDING  Incomplete  Blood culture (routine x 2)     Status: None (Preliminary result)   Collection Time: 09/30/20  6:20 PM   Specimen: BLOOD  Result Value Ref Range Status   Specimen Description BLOOD SITE NOT SPECIFIED  Final   Special Requests   Final    BOTTLES DRAWN AEROBIC AND ANAEROBIC Blood Culture results may not be optimal due to an inadequate volume of blood received in culture bottles   Culture   Final    NO GROWTH < 24 HOURS Performed at [facility], [place] [address] [place], [place] [address]    Report Status PENDING  Incomplete  Resp Panel by RT-PCR (Flu A&B, Covid) Nasopharyngeal Swab     Status: None   Collection Time: 09/30/20  6:23 PM   Specimen:  Nasopharyngeal Swab; Nasopharyngeal(NP) swabs in vial transport medium  Result Value Ref Range Status   SARS Coronavirus 2 by RT PCR NEGATIVE NEGATIVE Final    Comment: (NOTE) SARS-CoV-2 target nucleic acids are NOT DETECTED.  The SARS-CoV-2 RNA is generally detectable in upper respiratory specimens during the acute phase of infection. The lowest concentration of SARS-CoV-2 viral copies this assay can detect is 138 copies/mL. A negative result does not preclude SARS-Cov-2 infection and should not be used as the sole basis for treatment or other patient management decisions. A negative result may occur with  improper specimen collection/handling, submission of specimen other than nasopharyngeal swab, presence of viral mutation(s) within the areas targeted by this assay, and inadequate number of viral copies(<138 copies/mL). A negative result must be combined with clinical observations, patient history, and epidemiological information. The expected result is Negative.  Fact Sheet for Patients:  EntrepreneurPulse.com.au  Fact Sheet for Healthcare Providers:  IncredibleEmployment.be  This test is no t yet approved or cleared by the [place] FDA and  has been authorized for detection and/or diagnosis of SARS-CoV-2 by FDA under an Emergency Use Authorization (EUA). This EUA will remain  in effect (meaning this test can be used) for the duration of the COVID-19 declaration under Section 564(b)(1) of the Act, 21 U.S.C.section 360bbb-3(b)(1), unless the authorization is terminated  or revoked sooner.       Influenza A by PCR NEGATIVE NEGATIVE Final   Influenza B by PCR NEGATIVE NEGATIVE Final    Comment: (NOTE) The Xpert Xpress SARS-CoV-2/FLU/RSV plus assay is intended as an aid in the diagnosis of influenza from Nasopharyngeal swab specimens and should not be used as a sole basis for treatment. Nasal washings and aspirates are unacceptable for  Xpert Xpress SARS-CoV-2/FLU/RSV testing.  Fact Sheet for Patients: EntrepreneurPulse.com.au  Fact Sheet for Healthcare Providers: IncredibleEmployment.be  This test is not yet approved or cleared by the [place] FDA and has been authorized for detection and/or diagnosis of SARS-CoV-2 by FDA under an Emergency Use Authorization (EUA). This EUA will remain in effect (meaning this test can be used) for the duration of the COVID-19 declaration under Section 564(b)(1) of the Act, 21 U.S.C. section 360bbb-3(b)(1), unless the authorization is terminated or revoked.  Performed at [facility], [place] [address], [place], [place] [address]     Pertinent Lab. CBC Latest Ref Rng & Units 10/01/2020 09/30/2020 02/18/2018  WBC 4.0 - 10.5 K/uL 5.1 6.1 6.9  Hemoglobin 13.0 - 17.0 g/dL 12.0(L) 13.2 12.8(L)  Hematocrit 39.0 - 52.0 % 37.7(L) 41.6 40.1  Platelets 150 - 400 K/uL 359  376 259   CMP Latest Ref Rng & Units 10/02/2020 10/01/2020 09/30/2020  Glucose 70 - 99 mg/dL 78 176(H) 82  BUN 6 - 20 mg/dL 10 13 14   Creatinine 0.61 - 1.24 mg/dL 0.92 0.77 0.84  Sodium 135 - 145 mmol/L 137 136 136  Potassium 3.5 - 5.1 mmol/L 4.0 4.0 4.3  Chloride 98 - 111 mmol/L 106 105 106  CO2 22 - 32 mmol/L 22 22 24   Calcium 8.9 - 10.3 mg/dL 8.3(L) 8.2(L) 8.4(L)  Total Protein 6.5 - 8.1 g/dL - 7.2 -  Total Bilirubin 0.3 - 1.2 mg/dL - 0.4 -  Alkaline Phos 38 - 126 U/L - 155(H) -  AST 15 - 41 U/L - 18 -  ALT 0 - 44 U/L - 8 -     Pertinent Imaging today Plain films and CT images have been personally visualized and interpreted; radiology reports have been reviewed. Decision making incorporated into the Impression / Recommendations.  CT Chest 10/01/2020 FINDINGS: Cardiovascular: The heart and great vessels are unremarkable without pericardial effusion. No evidence of thoracic aortic aneurysm or dissection.  Mediastinum/Nodes: No enlarged mediastinal, hilar, or axillary  lymph nodes. Thyroid gland, trachea, and esophagus demonstrate no significant findings.  Lungs/Pleura: There are scattered bilateral interstitial and ground-glass opacities. The appearance is most consistent with atypical pneumonia in an immunocompromised patient, such as pneumocystis pneumonia. No effusion or pneumothorax. The central airways are patent. Mild bullous emphysematous changes at the lung apices.  Upper Abdomen: No acute abnormality.  Musculoskeletal: No acute or destructive bony lesions. Reconstructed images demonstrate no additional findings.  IMPRESSION: 1. Scattered bilateral interstitial and ground-glass opacities, compatible with multifocal atypical pneumonia in an immunocompromised patient. 2. Emphysema.  I have spent more than 35 minutes for this patient encounter including review of prior medical records, coordination of care  with greater than 50% of time being face to face/counseling and discussing diagnostics/treatment plan with the patient/family.  Electronically signed by:   Rosiland Oz, MD Infectious Disease Physician [facility] for Infectious Disease Pager: [phone]

## 2020-10-02 NOTE — TOC Benefit Eligibility Note (Signed)
Patient Advocate Encounter   Was successful in obtaining a [place] copay card for [employer].  This copay card will make the patients copay $0.00.   RxBin: [id number] PCN: ACCESS Member ID: [id number] Group ID: [id number]     Lyndel Safe, [place] Patient Advocate Specialist [place] [facility] Stewardship Team Direct Number: [phone]  Fax: [phone]

## 2020-10-02 NOTE — Progress Notes (Signed)
PROGRESS NOTE  Steven Johnston  DOB: 17-Sep-1960  PCP: Patient, No Pcp Per (Inactive) IWL:[id number]  DOA: 09/30/2020  LOS: 2 days   Chief Complaint  Patient presents with  . Cough  . Weakness   Brief narrative: Steven Johnston is a 60 y.o. male with no known medical history who presented to the ED on 09/30/2020 with complaint of productive cough, 20 pound weight loss, generalized weakness, shortness of breath, Subjective fever progressively worsening for last 2 months.    In the ED, patient was afebrile, oxygen saturation 86% on room air Initial labs unremarkable Chest x-ray on admission showed diffuse bilateral interstitial and alveolar airspace opacities consistent with multifocal pneumonia HIV test sent on admission was reactive. Patient was admitted to hospital service for further evaluation management ID consult obtained.  Subjective: Patient was seen and examined this afternoon. Thin built middle-aged African-American male.  Looks older for his age. On 3 L oxygen by nasal cannula. Chart reviewed No fever last 24 hours, remains hemodynamically stable, on 2 L oxygen by nasal cannula  Assessment/Plan: Multifocal pneumonia -CAP versus pneumocystis jerovicci pneumonia -Presented with shortness of breath, weight loss, fever chills last 2 months -Noted to be HIV positive -Chest x-ray with multifocal pneumonia -Currently on IV Rocephin and IV azithromycin for CAP coverage -Also on Bactrim and prednisone for possible PJP pneumonia -Labs and serologies per ID  HIV -Per previous chart, patient was first noted to have positive HIV antibody 2019, lost to follow-up. -HIV active -ID plans to initiate ART soon  Acute hypoxic respiratory failure, POA -Currently on 2 L oxygen by nasal cannula to maintain saturation more than 90%.    Severe protein calorie malnutrition -Significant loss of muscle mass and fat depletion. -Nutrition consult appreciated  Mobility: Encourage  ambulation Code Status:   Code Status: Full Code  Nutritional status: Body mass index is 20.67 kg/m. Nutrition Problem: Increased nutrient needs Etiology: chronic illness (HIV) Signs/Symptoms: estimated needs Diet Order            Diet regular Room service appropriate? Yes; Fluid consistency: Thin  Diet effective now                 DVT prophylaxis: enoxaparin (LOVENOX) injection 40 mg Start: 09/30/20 2200   Antimicrobials:  IV Rocephin/IV azithromycin, Bactrim Fluid: None Consultants: ID Family Communication:  Not at bedside  Status is: Inpatient  Remains inpatient appropriate because: Needs further diagnostic studies  Dispo: The patient is from: Home              Anticipated d/c is to: Home              Patient currently is not medically stable to d/c.   Difficult to place patient No     Infusions:  . azithromycin Stopped (10/01/20 2311)  . cefTRIAXone (ROCEPHIN)  IV Stopped (10/02/20 0710)  . sulfamethoxazole-trimethoprim 320 mg (10/02/20 1120)    Scheduled Meds: . bictegravir-emtricitabine-tenofovir AF  1 tablet Oral Daily  . enoxaparin (LOVENOX) injection  40 mg Subcutaneous Q24H  . feeding supplement  237 mL Oral TID BM  . predniSONE  40 mg Oral BID WC  . tuberculin  5 Units Intradermal Once    Antimicrobials: Anti-infectives (From admission, onward)   Start     Dose/Rate Route Frequency Ordered Stop   10/02/20 1000  bictegravir-emtricitabine-tenofovir AF (BIKTARVY) 50-200-25 MG per tablet 1 tablet        1 tablet Oral Daily 10/02/20 0854     10/01/20 2000  cefTRIAXone (ROCEPHIN) 2 g in sodium chloride 0.9 % 100 mL IVPB        2 g 200 mL/hr over 30 Minutes Intravenous Every 24 hours 09/30/20 1938 10/06/20 1959   10/01/20 2000  azithromycin (ZITHROMAX) 500 mg in sodium chloride 0.9 % 250 mL IVPB        500 mg 250 mL/hr over 60 Minutes Intravenous Every 24 hours 09/30/20 1938 10/06/20 1959   09/30/20 2000  sulfamethoxazole-trimethoprim (BACTRIM) 320 mg  in dextrose 5 % 500 mL IVPB        320 mg 346.7 mL/hr over 90 Minutes Intravenous Every 8 hours 09/30/20 1926     09/30/20 1915  cefTRIAXone (ROCEPHIN) 1 g in sodium chloride 0.9 % 100 mL IVPB        1 g 200 mL/hr over 30 Minutes Intravenous  Once 09/30/20 1910 09/30/20 2002   09/30/20 1915  azithromycin (ZITHROMAX) 500 mg in sodium chloride 0.9 % 250 mL IVPB        500 mg 250 mL/hr over 60 Minutes Intravenous  Once 09/30/20 1910 09/30/20 2240      PRN meds: pneumococcal 23 valent vaccine   Objective: Vitals:   10/01/20 1942 10/02/20 0508  BP: 103/70 114/68  Pulse: 85 87  Resp: 20 20  Temp: 98.1 F (36.7 C) 97.8 F (36.6 C)  SpO2: 94% 93%    Intake/Output Summary (Last 24 hours) at 10/02/2020 1546 Last data filed at 10/02/2020 2956 Gross per 24 hour  Intake 1438.67 ml  Output 1000 ml  Net 438.67 ml   Filed Weights   09/30/20 1807  Weight: 63.5 kg   Weight change:  Body mass index is 20.67 kg/m.   Physical Exam: General exam: Pleasant, middle-aged African-American male.  Not in distress Skin: No rashes, lesions or ulcers. HEENT: Atraumatic, normocephalic, no obvious bleeding Lungs: Diminished air entry in both bases CVS: Regular rate and rhythm, no murmur GI/Abd soft, nontender, nondistended, bowel sound present CNS: Alert, awake, oriented x3 Psychiatry: Mood appropriate Extremities: No pedal edema, no calf tenderness  Data Review: I have personally reviewed the laboratory data and studies available.  Recent Labs  Lab 09/30/20 1427 10/01/20 0220  WBC 6.1 5.1  HGB 13.2 12.0*  HCT 41.6 37.7*  MCV 84.7 84.0  PLT 376 359   Recent Labs  Lab 09/30/20 1427 10/01/20 0220 10/02/20 0429  NA 136 136 137  K 4.3 4.0 4.0  CL 106 105 106  CO2 24 22 22   GLUCOSE 82 176* 78  BUN 14 13 10   CREATININE 0.84 0.77 0.92  CALCIUM 8.4* 8.2* 8.3*    F/u labs ordered Unresulted Labs (From admission, onward)          Start     Ordered   10/03/20 0500  Acid Fast  Smear (AFB)  (AFB smear + Culture w reflexed sensitivities panel)  Tomorrow morning,   R       "And" Linked Group Details   10/02/20 0746   10/03/20 0500  Acid Fast Culture with reflexed sensitivities  (AFB smear + Culture w reflexed sensitivities panel)  Tomorrow morning,   R       "And" Linked Group Details   10/02/20 0746   10/02/20 1408  Hepatitis B e antibody  Add-on,   AD       Question:  Specimen collection method  Answer:  Lab=Lab collect   10/02/20 1407   10/02/20 1407  Hepatitis B e antigen  Add-on,   AD  Question:  Specimen collection method  Answer:  Lab=Lab collect   10/02/20 1407   10/02/20 1407  Hepatitis B DNA, Ultraquantitative, PCR  Add-on,   AD       Question:  Specimen collection method  Answer:  Lab=Lab collect   10/02/20 1407   10/02/20 0715  Blastomyces Antigen  Add-on,   AD        10/02/20 0714   10/02/20 0500  Hepatitis B surface antibody  Tomorrow morning,   R        10/01/20 0933   10/02/20 0500  HIV-1 RNA, PCR (Graph) Rfx/Geno EDI  Tomorrow morning,   R        10/01/20 0934   10/02/20 0500  Acid Fast Smear (AFB)  (AFB smear + Culture w reflexed sensitivities panel)  Tomorrow morning,   R       "And" Linked Group Details   10/01/20 1133   10/02/20 0500  Acid Fast Culture with reflexed sensitivities  (AFB smear + Culture w reflexed sensitivities panel)  Tomorrow morning,   R       "And" Linked Group Details   10/01/20 1133   10/01/20 2100  Legionella Pneumophila Serogp 1 Ur Ag  Once,   R        10/01/20 2100   10/01/20 2000  Legionella Pneumophila Serogp 1 Ur Ag  Once,   R        10/01/20 2000   10/01/20 1133  Acid Fast Smear (AFB)  (AFB smear + Culture w reflexed sensitivities panel)  Once,   R       "And" Linked Group Details   10/01/20 1132   10/01/20 1133  Acid Fast Culture with reflexed sensitivities  (AFB smear + Culture w reflexed sensitivities panel)  Once,   R       "And" Linked Group Details   10/01/20 1132   10/01/20 1124  Strep  pneumoniae urinary antigen  Once,   R        10/01/20 1124   10/01/20 1124  Respiratory (~20 pathogens) panel by PCR  (Respiratory panel by PCR (~20 pathogens, ~24 hr TAT)  w precautions)  Once,   R        10/01/20 1124   10/01/20 1123  Histoplasma antigen, urine  Once,   R        10/01/20 1122   10/01/20 1121  Fungitell, Serum  Once,   R        10/01/20 1122   10/01/20 1121  QuantiFERON-TB Gold Plus  Once,   R        10/01/20 1122   10/01/20 0933  Pneumocystis smear by DFA  Once,   R        10/01/20 0932   09/30/20 1936  Sputum culture  (Non-severe pneumonia (non-ICU care) in adult without resistant organism risk factors.)  Once,   R       Question:  Patient immune status  Answer:  Immunocompromised   09/30/20 1938   09/30/20 1934  HIV-1 RNA, PCR (Graph) Rfx/Geno EDI  Once,   STAT        09/30/20 1938   09/30/20 1427  Urinalysis, Routine w reflex microscopic  Once,   STAT        09/30/20 1427          Signed, Terrilee Croak, MD [facility] 10/02/2020

## 2020-10-02 NOTE — Progress Notes (Signed)
Sputum Culture sample was collected and sent to lab

## 2020-10-02 NOTE — Plan of Care (Signed)

## 2020-10-03 ENCOUNTER — Other Ambulatory Visit: Payer: Self-pay

## 2020-10-03 LAB — HEPATITIS B SURFACE ANTIBODY, QUANTITATIVE: Hep B S AB Quant (Post): >1000 m[IU]/mL (ref >9.9)

## 2020-10-03 LAB — QUANTIFERON-TB GOLD PLUS (RQFGPL)
QuantiFERON Mitogen Value: 0.08 IU/mL
QuantiFERON Nil Value: 0 IU/mL
QuantiFERON TB1 Ag Value: 0.01 IU/mL
QuantiFERON TB2 Ag Value: 0.02 IU/mL

## 2020-10-03 LAB — HEPATITIS B DNA, ULTRAQUANTITATIVE, PCR
HBV DNA SERPL PCR-ACNC: NOT DETECTED IU/mL
HBV DNA SERPL PCR-LOG IU: UNDETERMINED log10 IU/mL

## 2020-10-03 LAB — LEGIONELLA PNEUMOPHILA SEROGP 1 UR AG: L. pneumophila Serogp 1 Ur Ag: NEGATIVE

## 2020-10-03 LAB — BASIC METABOLIC PANEL
Anion gap: 9 (ref 5–15)
BUN: 17 mg/dL (ref 6–20)
CO2: 23 mmol/L (ref 22–32)
Calcium: 8.4 mg/dL — ABNORMAL LOW (ref 8.9–10.3)
Chloride: 105 mmol/L (ref 98–111)
Creatinine, Ser: 0.9 mg/dL (ref 0.61–1.24)
GFR, Estimated: >60 mL/min (ref >60)
Glucose, Bld: 91 mg/dL (ref 70–99)
Potassium: 4.3 mmol/L (ref 3.5–5.1)
Sodium: 137 mmol/L (ref 135–145)

## 2020-10-03 LAB — EXPECTORATED SPUTUM ASSESSMENT W GRAM STAIN, RFLX TO RESP C

## 2020-10-03 LAB — ACID FAST SMEAR (AFB, MYCOBACTERIA): Acid Fast Smear: NEGATIVE

## 2020-10-03 LAB — QUANTIFERON-TB GOLD PLUS: QuantiFERON-TB Gold Plus: UNDETERMINED — AB

## 2020-10-03 LAB — HEPATITIS B E ANTIBODY: Hep B E Ab: POSITIVE — AB

## 2020-10-03 LAB — HEPATITIS B E ANTIGEN: Hep B E Ag: NEGATIVE

## 2020-10-03 MED ORDER — SULFAMETHOXAZOLE-TRIMETHOPRIM 800-160 MG PO TABS
2.0000 | ORAL_TABLET | Freq: Three times a day (TID) | ORAL | Status: DC
  Administered 2020-10-03 – 2020-10-06 (×8): 2 via ORAL
  Filled 2020-10-03 (×9): qty 2

## 2020-10-03 MED ORDER — AZITHROMYCIN 250 MG PO TABS
500.0000 mg | ORAL_TABLET | Freq: Every day | ORAL | Status: AC
  Administered 2020-10-03 – 2020-10-04 (×2): 500 mg via ORAL
  Filled 2020-10-03 (×3): qty 2

## 2020-10-03 NOTE — Progress Notes (Signed)
[facility] Infectious Diseases Follow Up Note  Patient Identification: Patient Name: Steven Johnston MRN: [id number] [place] Date: 09/30/2020  2:04 PM Age: 60 y.o.Today's Date: 10/03/2020   Reason for Visit: HIV and possible PJP PNA  Principal Problem:   Bilateral pneumonia Active Problems:   HIV disease ([place])   Acute respiratory failure with hypoxia (HCC)   Bilateral interstitial pneumonia (HCC)   Night sweats   Unspecified severe protein-calorie malnutrition (HCC)   Antibiotics:ceftriaxone 5/1 - current Azithromycin 5/1 - current  Bactrim 5/1 - current   Lines: PIVs    Interval Events: afebrile, no leukocytosis. 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 Bactrim can be switched from IV to PO. Patient is able to take PO Pending 2 AFB smear and cx ( NEEDS TO BE COLLECTED). I have instructed RN for sputum collection. If sputum cannot be collected, will need pulmonology consult for diagnostic  bronch/bal  Fu PJP DFA, Fungitel  Started on [place] yesterday, observe for IRIS Will provide 30 day sample of biktarvy on discharge and fu at [place] of the management as per the primary team. Thank you for the consult. Please page with pertinent questions or concerns.  ______________________________________________________________________ Subjective patient seen and examined at the bedside. Still requiring 2 L nasal cannula . Breathing better. Denies any issues with Biktarvy    Vitals BP 114/86 (BP Location: Left Arm)   Pulse 73   Temp 98 F (36.7 C) (Oral)   Resp 20   Ht _0  (1.753 m)   Wt 63.5 kg   SpO2 94%   BMI 20.67 kg/m     Physical Exam Constitutional:  on 2 l nasal cannula     Comments:   Cardiovascular:     Rate and Rhythm: Normal rate and regular rhythm.     Heart sounds: No murmur heard.   Pulmonary:     Effort: bilateral     Comments:   Abdominal:     Palpations: Abdomen is soft.     Tenderness:   Musculoskeletal:        General: No swelling or tenderness.   Skin:    Comments:   Neurological:     General: No focal deficit present.   Psychiatric:        Mood and Affect: Mood normal.     Pertinent Microbiology Results for orders placed or performed during the hospital encounter of 09/30/20  Blood culture (routine x 2)     Status: None (Preliminary result)   Collection Time: 09/30/20  1:34 PM   Specimen: BLOOD  Result Value Ref Range Status   Specimen Description BLOOD SITE NOT SPECIFIED  Final   Special Requests   Final    BOTTLES DRAWN AEROBIC AND ANAEROBIC Blood Culture results may not be optimal due to an inadequate volume of blood received in culture bottles   Culture   Final    NO GROWTH 2 DAYS Performed at [facility], [place] [address], [place], [place] [address]    Report Status PENDING  Incomplete  Blood culture (routine x 2)     Status:  None (Preliminary result)   Collection Time: 09/30/20  6:20 PM   Specimen: BLOOD  Result Value Ref Range Status   Specimen Description BLOOD SITE NOT SPECIFIED  Final   Special Requests   Final    BOTTLES DRAWN AEROBIC AND ANAEROBIC Blood Culture results may not be optimal due to an inadequate volume of blood received in culture bottles   Culture   Final    NO GROWTH 2 DAYS Performed at [facility], [place] [address] [place], [place] [address]    Report Status PENDING  Incomplete  Resp Panel by RT-PCR (Flu A&B, Covid) Nasopharyngeal Swab     Status: None    Collection Time: 09/30/20  6:23 PM   Specimen: Nasopharyngeal Swab; Nasopharyngeal(NP) swabs in vial transport medium  Result Value Ref Range Status   SARS Coronavirus 2 by RT PCR NEGATIVE NEGATIVE Final    Comment: (NOTE) SARS-CoV-2 target nucleic acids are NOT DETECTED.  The SARS-CoV-2 RNA is generally detectable in upper respiratory specimens during the acute phase of infection. The lowest concentration of SARS-CoV-2 viral copies this assay can detect is 138 copies/mL. A negative result does not preclude SARS-Cov-2 infection and should not be used as the sole basis for treatment or other patient management decisions. A negative result may occur with  improper specimen collection/handling, submission of specimen other than nasopharyngeal swab, presence of viral mutation(s) within the areas targeted by this assay, and inadequate number of viral copies(<138 copies/mL). A negative result must be combined with clinical observations, patient history, and epidemiological information. The expected result is Negative.  Fact Sheet for Patients:  EntrepreneurPulse.com.au  Fact Sheet for Healthcare Providers:  IncredibleEmployment.be  This test is no t yet approved or cleared by the [place] FDA and  has been authorized for detection and/or diagnosis of SARS-CoV-2 by FDA under an Emergency Use Authorization (EUA). This EUA will remain  in effect (meaning this test can be used) for the duration of the COVID-19 declaration under Section 564(b)(1) of the Act, 21 U.S.C.section 360bbb-3(b)(1), unless the authorization is terminated  or revoked sooner.       Influenza A by PCR NEGATIVE NEGATIVE Final   Influenza B by PCR NEGATIVE NEGATIVE Final    Comment: (NOTE) The Xpert Xpress SARS-CoV-2/FLU/RSV plus assay is intended as an aid in the diagnosis of influenza from Nasopharyngeal swab specimens and should not be used as a sole basis for treatment.  Nasal washings and aspirates are unacceptable for Xpert Xpress SARS-CoV-2/FLU/RSV testing.  Fact Sheet for Patients: EntrepreneurPulse.com.au  Fact Sheet for Healthcare Providers: IncredibleEmployment.be  This test is not yet approved or cleared by the [place] FDA and has been authorized for detection and/or diagnosis of SARS-CoV-2 by FDA under an Emergency Use Authorization (EUA). This EUA will remain in effect (meaning this test can be used) for the duration of the COVID-19 declaration under Section 564(b)(1) of the Act, 21 U.S.C. section 360bbb-3(b)(1), unless the authorization is terminated or revoked.  Performed at [facility], [place] [address], [place], [place] [address]   Acid Fast Smear (AFB)     Status: None   Collection Time: 10/01/20  8:32 PM   Specimen: Sputum  Result Value Ref Range Status   AFB Specimen Processing Concentration  Final   Acid Fast Smear Negative  Final    Comment: (NOTE) Performed At: [facility] [place], [place] [id number] Rush Farmer MD SH:[id number]    Source (AFB) SPUTUM  Final  Comment: Performed at [facility], [place] [address], [place], [place] [address]     Pertinent Lab. CBC Latest Ref Rng & Units 10/01/2020 09/30/2020 02/18/2018  WBC 4.0 - 10.5 K/uL 5.1 6.1 6.9  Hemoglobin 13.0 - 17.0 g/dL 12.0(L) 13.2 12.8(L)  Hematocrit 39.0 - 52.0 % 37.7(L) 41.6 40.1  Platelets 150 - 400 K/uL 359 376 259   CMP Latest Ref Rng & Units 10/02/2020 10/01/2020 09/30/2020  Glucose 70 - 99 mg/dL 78 176(H) 82  BUN 6 - 20 mg/dL _0 Creatinine 0.61 - 1.24 mg/dL 0.92 0.77 0.84  Sodium 135 - 145 mmol/L 137 136 136  Potassium 3.5 - 5.1 mmol/L 4.0 4.0 4.3  Chloride 98 - 111 mmol/L 106 105 106  CO2 22 - 32 mmol/L _1 Calcium 8.9 - 10.3 mg/dL 8.3(L) 8.2(L) 8.4(L)  Total Protein 6.5 - 8.1 g/dL - 7.2 -  Total Bilirubin 0.3 - 1.2 mg/dL - 0.4 -  Alkaline Phos 38 - 126 U/L -  155(H) -  AST 15 - 41 U/L - 18 -  ALT 0 - 44 U/L - 8 -     Pertinent Imaging today Plain films and CT images have been personally visualized and interpreted; radiology reports have been reviewed. Decision making incorporated into the Impression / Recommendations.  I have spent more than 35 minutes for this patient encounter including review of prior medical records, coordination of care  with greater than 50% of time being face to face/counseling and discussing diagnostics/treatment plan with the patient/family.  Electronically signed by:   Rosiland Oz, MD Infectious Disease Physician [facility] Infectious [facility] Pager: [phone]

## 2020-10-03 NOTE — Progress Notes (Signed)
Dr. [place] Bali reached out to this nurse on f/u of sputum collection verbalizing this nurse stated night shift nurse collected sputum sample for patient. Dr. stated that no notes were documented discussing a collection was obtained. Dr. reminded that per our conversation this morning, this nurse stated: night nurse stated she collected and sent a sputum sample and that the patient has a specimen cup in the room for additional sputum collection.  Doctor was informed that it is the nurses discretion whether to state a lab was collected as there are requisition forms.  Doctor informed that thus far patient has had no additional sputum to be collected.

## 2020-10-03 NOTE — Progress Notes (Signed)
Pt reporting to RN he has medicaid.  Per admitting, pt only showing with family planning medicaid.  CSW spoke with pt by phone who reports he received a call from someone in the hospital saying he may be eligible.  CSW emailed Saprese Jones in financial counseling who reports:   This account is currently being followed by Trinidad Curet with [employer].  She screened the patient for medicaid on 5/3 and determined that he did not meet the eligibility requirements, but she is still following his account to see if his condition will change to maybe meet a disability requirement.  Lurline Idol, MSW, LCSW 5/4/202210:37 AM

## 2020-10-03 NOTE — Progress Notes (Signed)
SATURATION QUALIFICATIONS: (This note is used to comply with regulatory documentation for home oxygen)  Patient Saturations on Room Air at Rest = 97% on 2L and 97% on 0 Liters at rest.  Patient Saturations on Room Air while Ambulating = 89%  Patient Saturations on 0 Liters of oxygen while Ambulating = 89%  Pt ambulated from door to window in room, 10 reps. Heart race increased to max noted 114.

## 2020-10-03 NOTE — Progress Notes (Signed)
PROGRESS NOTE  Steven Johnston  DOB: June 29, 1960  PCP: Steven Johnston (Inactive) OZY:[id number]  DOA: 09/30/2020  LOS: 3 days   Chief Complaint  Patient presents with  . Cough  . Weakness   Brief narrative: Steven Johnston is a 60 y.o. male with no known medical history who presented to the ED on 09/30/2020 with complaint of productive cough, 20 pound weight loss, generalized weakness, shortness of breath, Subjective fever progressively worsening for last 2 months.    In the ED, patient was afebrile, oxygen saturation 86% on room air Initial labs unremarkable Chest x-ray on admission showed diffuse bilateral interstitial and alveolar airspace opacities consistent with multifocal pneumonia HIV test sent on admission was reactive. Patient was admitted to hospital service for further evaluation management ID consult obtained.  Subjective: Patient was seen and examined this morning. Sitting up in bed.  Not in distress.  He had his oxygen supplementation of and was wheezing and tachypneic.  After putting the oxygen cannula back, his respiratory status improved.  Assessment/Plan: Multifocal pneumonia -CAP versus pneumocystis jerovicci pneumonia -Presented with shortness of breath, weight loss, fever chills last 2 months -Noted to be HIV positive -Chest x-ray with multifocal pneumonia -Currently on IV Rocephin and azithromycin for CAP coverage -Also on Bactrim and prednisone for possible PJP pneumonia. -Noted a plan from ID to complete 5-day course of IV Rocephin and azithromycin, to complete on 5/5.  HIV -Johnston previous chart, patient was first noted to have positive HIV antibody 2019, lost to follow-up. -HIV active -ID consult appreciated. -Labs and serology in process.  3 samples of AFB smear sent. -ID has started the patient on ART with Biktarvy 1 tab daily.  Acute hypoxic respiratory failure, POA -Currently on 2 L oxygen by nasal cannula to maintain saturation more than 90%.    -Check ambulatory oxygen requirement.  Severe protein calorie malnutrition -Significant loss of muscle mass and fat depletion. -Nutrition consult appreciated  Mobility: Encourage ambulation Code Status:   Code Status: Full Code  Nutritional status: Body mass index is 20.67 kg/m. Nutrition Problem: Increased nutrient needs Etiology: chronic illness (HIV) Signs/Symptoms: estimated needs Diet Order            Diet regular Room service appropriate? Yes; Fluid consistency: Thin  Diet effective now                 DVT prophylaxis: enoxaparin (LOVENOX) injection 40 mg Start: 09/30/20 2200   Antimicrobials:  IV Rocephin/IV azithromycin, Bactrim, Biktarvy Fluid: None Consultants: ID Family Communication:  Not at bedside  Status is: Inpatient  Remains inpatient appropriate because: IV antibiotics, ongoing investigation  Dispo: The patient is from: Home              Anticipated d/c is to: Home likely tomorrow if cleared by ID              Patient currently is not medically stable to d/c.   Difficult to place patient No   Infusions:  . cefTRIAXone (ROCEPHIN)  IV 2 g (10/02/20 1949)    Scheduled Meds: . azithromycin  500 mg Oral QHS  . bictegravir-emtricitabine-tenofovir AF  1 tablet Oral Daily  . enoxaparin (LOVENOX) injection  40 mg Subcutaneous Q24H  . feeding supplement  237 mL Oral TID BM  . predniSONE  40 mg Oral BID WC  . sulfamethoxazole-trimethoprim  2 tablet Oral Q8H    Antimicrobials: Anti-infectives (From admission, onward)   Start     Dose/Rate Route Frequency Ordered Stop  10/03/20 2200  azithromycin (ZITHROMAX) tablet 500 mg        500 mg Oral Daily at bedtime 10/03/20 0919 10/05/20 2159   10/03/20 1200  sulfamethoxazole-trimethoprim (BACTRIM DS) 800-160 MG Johnston tablet 2 tablet        2 tablet Oral Every 8 hours 10/03/20 0852     10/02/20 1000  bictegravir-emtricitabine-tenofovir AF (BIKTARVY) 50-200-25 MG Johnston tablet 1 tablet        1 tablet Oral  Daily 10/02/20 0854     10/01/20 2000  cefTRIAXone (ROCEPHIN) 2 g in sodium chloride 0.9 % 100 mL IVPB        2 g 200 mL/hr over 30 Minutes Intravenous Every 24 hours 09/30/20 1938 10/06/20 1959   10/01/20 2000  azithromycin (ZITHROMAX) 500 mg in sodium chloride 0.9 % 250 mL IVPB  Status:  Discontinued        500 mg 250 mL/hr over 60 Minutes Intravenous Every 24 hours 09/30/20 1938 10/03/20 0919   09/30/20 2000  sulfamethoxazole-trimethoprim (BACTRIM) 320 mg in dextrose 5 % 500 mL IVPB  Status:  Discontinued        320 mg 346.7 mL/hr over 90 Minutes Intravenous Every 8 hours 09/30/20 1926 10/03/20 0852   09/30/20 1915  cefTRIAXone (ROCEPHIN) 1 g in sodium chloride 0.9 % 100 mL IVPB        1 g 200 mL/hr over 30 Minutes Intravenous  Once 09/30/20 1910 09/30/20 2002   09/30/20 1915  azithromycin (ZITHROMAX) 500 mg in sodium chloride 0.9 % 250 mL IVPB        500 mg 250 mL/hr over 60 Minutes Intravenous  Once 09/30/20 1910 09/30/20 2240      PRN meds: pneumococcal 23 valent vaccine   Objective: Vitals:   10/02/20 1940 10/03/20 0505  BP: 118/81 114/86  Pulse: 95 73  Resp: 20 20  Temp: 97.9 F (36.6 C) 98 F (36.7 C)  SpO2: 93% 94%    Intake/Output Summary (Last 24 hours) at 10/03/2020 1149 Last data filed at 10/03/2020 0600 Gross Johnston 24 hour  Intake 1892.63 ml  Output 350 ml  Net 1542.63 ml   Filed Weights   09/30/20 1807  Weight: 63.5 kg   Weight change:  Body mass index is 20.67 kg/m.   Physical Exam: General exam: Pleasant, middle-aged African-American male.  Not in distress on oxygen via nasal cannula Skin: No rashes, lesions or ulcers. HEENT: Atraumatic, normocephalic, no obvious bleeding Lungs: Clear to auscultation bilaterally CVS: Regular rate and rhythm, no murmur GI/Abd soft, nontender, nondistended, bowel sound present CNS: Alert, awake, oriented x3 Psychiatry: Mood appropriate Extremities: No pedal edema, no calf tenderness  Data Review: I have personally  reviewed the laboratory data and studies available.  Recent Labs  Lab 09/30/20 1427 10/01/20 0220  WBC 6.1 5.1  HGB 13.2 12.0*  HCT 41.6 37.7*  MCV 84.7 84.0  PLT 376 359   Recent Labs  Lab 09/30/20 1427 10/01/20 0220 10/02/20 0429 10/03/20 0813  NA 136 136 137 137  K 4.3 4.0 4.0 4.3  CL 106 105 106 105  CO2 24 22 22 23   GLUCOSE 82 176* 78 91  BUN 14 13 10 17   CREATININE 0.84 0.77 0.92 0.90  CALCIUM 8.4* 8.2* 8.3* 8.4*    F/u labs ordered Unresulted Labs (From admission, onward)          Start     Ordered   10/03/20 0742  Acid Fast Smear (AFB)  (AFB smear + Culture w  reflexed sensitivities panel)  Once,   R       "And" Linked Group Details   10/03/20 0741   10/03/20 0742  Acid Fast Culture with reflexed sensitivities  (AFB smear + Culture w reflexed sensitivities panel)  Once,   R       "And" Linked Group Details   10/03/20 0741   10/03/20 0500  Acid Fast Smear (AFB)  (AFB smear + Culture w reflexed sensitivities panel)  Tomorrow morning,   R       "And" Linked Group Details   10/02/20 0746   10/03/20 0500  Acid Fast Culture with reflexed sensitivities  (AFB smear + Culture w reflexed sensitivities panel)  Tomorrow morning,   R       "And" Linked Group Details   10/02/20 0746   10/02/20 1407  Hepatitis B DNA, Ultraquantitative, PCR  Add-on,   AD       Question:  Specimen collection method  Answer:  Lab=Lab collect   10/02/20 1407   10/02/20 0715  Blastomyces Antigen  Add-on,   AD        10/02/20 0714   10/02/20 0500  HIV-1 RNA, PCR (Graph) Rfx/[facility] EDI  Tomorrow morning,   R        10/01/20 0934   10/02/20 0500  Acid Fast Smear (AFB)  (AFB smear + Culture w reflexed sensitivities panel)  Tomorrow morning,   R       "And" Linked Group Details   10/01/20 1133   10/02/20 0500  Acid Fast Culture with reflexed sensitivities  (AFB smear + Culture w reflexed sensitivities panel)  Tomorrow morning,   R       "And" Linked Group Details   10/01/20 1133   10/01/20 2100   Legionella Pneumophila Serogp 1 Ur Ag  Once,   R        10/01/20 2100   10/01/20 1133  Acid Fast Culture with reflexed sensitivities  (AFB smear + Culture w reflexed sensitivities panel)  Once,   R       "And" Linked Group Details   10/01/20 1132   10/01/20 1124  Strep pneumoniae urinary antigen  Once,   R        10/01/20 1124   10/01/20 1124  Respiratory (~20 pathogens) panel by PCR  (Respiratory panel by PCR (~20 pathogens, ~24 hr TAT)  w precautions)  Once,   R        10/01/20 1124   10/01/20 1123  Histoplasma antigen, urine  Once,   R        10/01/20 1122   10/01/20 1121  Fungitell, Serum  Once,   R        10/01/20 1122   10/01/20 1121  QuantiFERON-TB Gold Plus  Once,   R        10/01/20 1122   10/01/20 0933  Pneumocystis smear by DFA  Once,   R        10/01/20 0932   09/30/20 2155  Culture, Respiratory w Gram Stain  Once,   R        09/30/20 2155   09/30/20 1934  HIV-1 RNA, PCR (Graph) Rfx/[facility] EDI  Once,   STAT        09/30/20 1938          Signed, Terrilee Croak, MD [facility] 10/03/2020

## 2020-10-04 ENCOUNTER — Encounter (HOSPITAL_COMMUNITY): Payer: Self-pay | Admitting: Internal Medicine

## 2020-10-04 DIAGNOSIS — R64 Cachexia: Secondary | ICD-10-CM

## 2020-10-04 DIAGNOSIS — J9601 Acute respiratory failure with hypoxia: Secondary | ICD-10-CM

## 2020-10-04 DIAGNOSIS — B20 Human immunodeficiency virus [HIV] disease: Principal | ICD-10-CM

## 2020-10-04 DIAGNOSIS — J189 Pneumonia, unspecified organism: Secondary | ICD-10-CM

## 2020-10-04 LAB — BLASTOMYCES ANTIGEN: Blastomyces Antigen: NOT DETECTED ng/mL

## 2020-10-04 LAB — FUNGITELL, SERUM: Fungitell Result: >500 pg/mL — ABNORMAL HIGH (ref <80)

## 2020-10-04 NOTE — Progress Notes (Signed)
CSW continues to follow for discharge needs. Lurline Idol, MSW, LCSW 5/5/20223:28 PM

## 2020-10-04 NOTE — Consult Note (Addendum)
Name: Steven Johnston MRN: [id number] DOB: 23-Jun-1960    ADMISSION DATE:  09/30/2020 CONSULTATION DATE: 10/04/2020  REFERRING MD : [facility]  CHIEF COMPLAINT: Increasing dyspnea on exertion x4 weeks in the setting of HIV with no treatment and bilateral pneumonia  BRIEF PATIENT DESCRIPTION:  60 year old cachectic male in no acute distress at rest SIGNIFICANT EVENTS    STUDIES:  CT scan of chest 10/01/2020 with scattered bilateral interstitial groundglass opacities compatible with multifocal atypical pneumonia in an immunocompromised patient.   HISTORY OF PRESENT ILLNESS:  Mr. Gladden is a 60 year old long smoker, snorts cocaine 3 times a week for the last 25 years, and presents with a chief complaint of increasing dyspnea on exertion nonproductive cough and weight loss. Of note he had inguinal hernia repair 2019 at which time he was HIV positive but was lost to follow-up.  He has continued smoking to cocaine almost on a daily basis since that time.  Starting approximately 4 weeks ago he had a nonproductive cough was seen in urgent care and treated with mucolytic's and cough suppressants which he proved refractory to.  He was also treated with antimicrobial therapy for bronchitis.  He returned to the hospital chest x-ray showed bilateral airspace disease consistent with pneumonia and again his HIV was checked and was positive.  Pulmonary critical care is asked to see for possible fiberoptic bronchoscopy to determine pulmonary infection type.  PAST MEDICAL HISTORY :   has a past medical history of Hernia, inguinal, right.  has a past surgical history that includes Inguinal hernia repair (Right, 02/17/2018). Prior to Admission medications   Medication Sig Start Date End Date Taking? Authorizing Provider  albuterol (VENTOLIN HFA) 108 (90 Base) MCG/ACT inhaler Inhale 1-2 puffs into the lungs every 6 (six) hours as needed for wheezing or shortness of breath. 08/12/20  Yes Hazel Sams, PA-C   aspirin-acetaminophen-caffeine (EXCEDRIN MIGRAINE) [phone] MG tablet Take 3 tablets by mouth every 6 (six) hours as needed for headache or migraine.   Yes [provider]  naproxen sodium (ALEVE) 220 MG tablet Take 440 mg by mouth daily as needed (pain).   Yes [provider]  ibuprofen (ADVIL) 600 MG tablet Take 1 tablet (600 mg total) by mouth every 6 (six) hours as needed. Patient not taking: Reported on 09/30/2020 03/08/20   Chase Picket, MD  predniSONE (STERAPRED UNI-PAK 21 TAB) 10 MG (21) TBPK tablet Take by mouth daily. Take 6 tabs by mouth daily  for 2 days, then 5 tabs for 2 days, then 4 tabs for 2 days, then 3 tabs for 2 days, 2 tabs for 2 days, then 1 tab by mouth daily for 2 days Patient not taking: Reported on 09/30/2020 08/12/20   Hazel Sams, PA-C  SUMAtriptan (IMITREX) 25 MG tablet Take 1 tablet (25 mg total) by mouth once for 1 dose. May repeat in 2 hours if headache persists or recurs. Patient not taking: Reported on 09/30/2020 03/08/20 03/08/20  Chase Picket, MD   No Known Allergies  FAMILY HISTORY:  family history includes Cancer in his brother. SOCIAL HISTORY:  reports that he has been smoking. He has been smoking about 1.00 pack per day. He has never used smokeless tobacco. He reports current alcohol use. He reports that he does not use drugs.  REVIEW OF SYSTEMS:   Constitutional: Negative for fever, chills,+ weight loss, malaise/fatigue and diaphoresis.  HENT: Negative for hearing loss, ear pain, nosebleeds, congestion, sore throat, neck pain, tinnitus and ear discharge.  Eyes: Negative for blurred vision, double vision, photophobia, pain, discharge and redness.  Respiratory: + cough non productive, DOE x 4 weeks Cardiovascular: Negative for chest pain, palpitations, orthopnea, claudication, leg swelling and PND.  Gastrointestinal: Negative for heartburn, nausea, vomiting, abdominal pain, diarrhea, constipation, blood in stool and melena.   Genitourinary: Negative for dysuria, urgency, frequency, hematuria and flank pain.  Musculoskeletal: Negative for myalgias, back pain, joint pain and falls.  Skin: Negative for itching and rash.  Neurological: Negative for dizziness, tingling, tremors, sensory change, speech change, focal weakness, seizures, loss of consciousness, weakness and headaches.  Endo/Heme/Allergies: Negative for environmental allergies and polydipsia. Does not bruise/bleed easily.  SUBJECTIVE:  60 year old male sitting on side of the bed in no acute distress at rest.  VITAL SIGNS: Temp:  [97.8 F (36.6 C)-98.1 F (36.7 C)] 97.8 F (36.6 C) (05/05 0800) Pulse Rate:  [79-97] 97 (05/05 0800) Resp:  [20] 20 (05/05 0800) BP: (114-115)/(82-83) 115/83 (05/05 0506) SpO2:  [91 %-100 %] 100 % (05/05 0800)  PHYSICAL EXAMINATION: General: Cachectic male in no acute distress at rest on room air Neuro: Grossly intact without focal defect HEENT: Extremely poor dentition multiple caries and multiple missing teeth are noted.  No JVD or lymphadenopathy is appreciated Cardiovascular: Heart sounds are regular regular rate rhythm Lungs: Coarse rhonchi bilaterally Abdomen: Soft positive bowel sounds Musculoskeletal: Intact Skin: No axillary lymphadenopathy is appreciated  Recent Labs  Lab 10/01/20 0220 10/02/20 0429 10/03/20 0813  NA 136 137 137  K 4.0 4.0 4.3  CL 105 106 105  CO2 22 22 23   BUN 13 10 17   CREATININE 0.77 0.92 0.90  GLUCOSE 176* 78 91   Recent Labs  Lab 09/30/20 1427 10/01/20 0220  HGB 13.2 12.0*  HCT 41.6 37.7*  WBC 6.1 5.1  PLT 376 359   No results found.  ASSESSMENT : Principal Problem:   Bilateral pneumonia Active Problems:   HIV disease ([place]) continue treatment x3 years   Acute respiratory failure with hypoxia ([facility])   Bilateral interstitial pneumonia (HCC)   Night sweats   Unspecified severe protein-calorie malnutrition (HCC) Chronic substance abuse Tobacco  abuse    PLAN: Plan for fiberoptic bronchoscopy in the near future.  FOB 10/05/2020 @1230  pm. Appreciate infectious disease input Agree with antimicrobial therapy Stop smoking Stop doing cocaine   Richardson Landry Lashann Hagg ACNP Acute Care Nurse Practitioner [place] Please consult Amion 10/04/2020, 10:16 AM

## 2020-10-04 NOTE — Progress Notes (Signed)
[facility] Infectious Diseases Follow Up Note  Patient Identification: Patient Name: Steven Johnston MRN: [id number] [place] Date: 09/30/2020  2:04 PM Age: 60 y.o.Today's Date: 10/04/2020   Reason for Visit: PJP pneumonia  Principal Problem:   Bilateral pneumonia Active Problems:   HIV disease ([place])   Acute respiratory failure with hypoxia (HCC)   Bilateral interstitial pneumonia (HCC)   Night sweats   Unspecified severe protein-calorie malnutrition (HCC)  Antibiotics:ceftriaxone 5/1 - current Azithromycin 5/1 - current  Bactrim 5/1 - current  Lines: PIVs  Interval Events: Remains afebrile, no leukocytosis, hemodynamically stable, has been on room air since last evening.   ID work up:  5/1 sputum cx few yeast  10/01/20 Quantiferon 5/2 indeterminate  10/01/20 Cryptococcal ag negative 5/2 RPR negative  5/2 sputum AFB smear negative 10/01/20 Legionella pneumophilia ag urine negative  Assessment Chronic cough and exertional dyspnea, possible PJP pneumonia vs others: Improving on empiric PJP tx  Night sweats and weight loss, Indeterminate Quantiferon r/o TB  Malnutrition AIDS H/o Prior hepatitis B infection   Recommendations Complete 5 days course of ceftriaxone and azithromycin Continue p.o. Bactrim ( 2 tabs PO q8 hrs)  and prednisone p.o. for possible PJP pneumonia PJP DFA is negative.  This does not rule out PJP pneumonia.  Recommend pulmonary consult for bronchoscopy/BAL for diagnostic purposes. Follow-up AFB smear and cultures.  3 sputum samples has been collected Follow-up other pending serological and microbiological test Continue Biktarvy, monitor for IRIS Airborne precautions pending AFB smears  Following  Rest of the management as per the primary team. Thank you for the consult. Please page with pertinent questions or  concerns.  ______________________________________________________________________ Subjective patient seen and examined at the bedside. On room air.  Breathing is better, cough continues to persist - mostly dry  Vitals BP 115/83 (BP Location: Left Arm)   Pulse 79   Temp 97.8 F (36.6 C) (Oral)   Resp 20   Ht _0  (1.753 m)   Wt 63.5 kg   SpO2 93%   BMI 20.67 kg/m     Physical Exam Constitutional:   Not in acute distress.  On room air    Comments:   Cardiovascular:     Rate and Rhythm: Normal rate and regular rhythm.     Heart sounds: No murmur heard.   Pulmonary:     Effort: Pulmonary effort is normal.     Comments: Bilateral air sounds, fine crackles  Abdominal:     Palpations: Abdomen is soft.     Tenderness: Nontender  Musculoskeletal:        General: No swelling or tenderness.   Skin:    Comments: No obvious lesions or rashes  Neurological:     General: No focal deficit present.   Psychiatric:        Mood and Affect: Mood normal.   Pertinent Microbiology Results for orders placed or performed during the hospital encounter of 09/30/20  Blood culture (routine x 2)     Status: None (Preliminary result)   Collection Time: 09/30/20  1:34 PM   Specimen: BLOOD  Result Value Ref Range Status   Specimen Description BLOOD SITE NOT SPECIFIED  Final   Special Requests   Final    BOTTLES DRAWN AEROBIC AND ANAEROBIC Blood Culture results may not be optimal due to an inadequate volume of blood received in culture bottles   Culture   Final    NO GROWTH 3 DAYS Performed at [facility], [place],  [place] [address]    Report Status PENDING  Incomplete  Blood culture (routine x 2)     Status: None (Preliminary result)   Collection Time: 09/30/20  6:20 PM   Specimen: BLOOD  Result Value Ref Range Status   Specimen Description BLOOD SITE NOT SPECIFIED  Final   Special Requests   Final    BOTTLES DRAWN AEROBIC AND ANAEROBIC Blood Culture results  may not be optimal due to an inadequate volume of blood received in culture bottles   Culture   Final    NO GROWTH 3 DAYS Performed at [facility], [place] [address], [place], [place] [address]    Report Status PENDING  Incomplete  Resp Panel by RT-PCR (Flu A&B, Covid) Nasopharyngeal Swab     Status: None   Collection Time: 09/30/20  6:23 PM   Specimen: Nasopharyngeal Swab; Nasopharyngeal(NP) swabs in vial transport medium  Result Value Ref Range Status   SARS Coronavirus 2 by RT PCR NEGATIVE NEGATIVE Final    Comment: (NOTE) SARS-CoV-2 target nucleic acids are NOT DETECTED.  The SARS-CoV-2 RNA is generally detectable in upper respiratory specimens during the acute phase of infection. The lowest concentration of SARS-CoV-2 viral copies this assay can detect is 138 copies/mL. A negative result does not preclude SARS-Cov-2 infection and should not be used as the sole basis for treatment or other patient management decisions. A negative result may occur with  improper specimen collection/handling, submission of specimen other than nasopharyngeal swab, presence of viral mutation(s) within the areas targeted by this assay, and inadequate number of viral copies(<138 copies/mL). A negative result must be combined with clinical observations, patient history, and epidemiological information. The expected result is Negative.  Fact Sheet for Patients:  EntrepreneurPulse.com.au  Fact Sheet for Healthcare Providers:  IncredibleEmployment.be  This test is no t yet approved or cleared by the [place] FDA and  has been authorized for detection and/or diagnosis of SARS-CoV-2 by FDA under an Emergency Use Authorization (EUA). This EUA will remain  in effect (meaning this test can be used) for the duration of the COVID-19 declaration under Section 564(b)(1) of the Act, 21 U.S.C.section 360bbb-3(b)(1), unless the authorization is terminated  or revoked  sooner.       Influenza A by PCR NEGATIVE NEGATIVE Final   Influenza B by PCR NEGATIVE NEGATIVE Final    Comment: (NOTE) The Xpert Xpress SARS-CoV-2/FLU/RSV plus assay is intended as an aid in the diagnosis of influenza from Nasopharyngeal swab specimens and should not be used as a sole basis for treatment. Nasal washings and aspirates are unacceptable for Xpert Xpress SARS-CoV-2/FLU/RSV testing.  Fact Sheet for Patients: EntrepreneurPulse.com.au  Fact Sheet for Healthcare Providers: IncredibleEmployment.be  This test is not yet approved or cleared by the [place] FDA and has been authorized for detection and/or diagnosis of SARS-CoV-2 by FDA under an Emergency Use Authorization (EUA). This EUA will remain in effect (meaning this test can be used) for the duration of the COVID-19 declaration under Section 564(b)(1) of the Act, 21 U.S.C. section 360bbb-3(b)(1), unless the authorization is terminated or revoked.  Performed at [facility], [place] [address] [place], [place] [address]   Sputum culture     Status: None   Collection Time: 09/30/20  9:55 PM   Specimen: Sputum  Result Value Ref Range Status   Specimen Description SPUTUM  Final   Special Requests Immunocompromised  Final   Sputum evaluation   Final    THIS SPECIMEN IS  ACCEPTABLE FOR SPUTUM CULTURE Performed at [facility], [place] [address], [place], [place] [address]    Report Status 10/03/2020 FINAL  Final  Culture, Respiratory w Gram Stain     Status: None (Preliminary result)   Collection Time: 09/30/20  9:55 PM   Specimen: SPU  Result Value Ref Range Status   Specimen Description SPUTUM  Final   Special Requests Immunocompromised Reflexed from [id number]  Final   Gram Stain   Final    NO WBC SEEN RARE SQUAMOUS EPITHELIAL CELLS PRESENT MODERATE YEAST RARE GRAM POSITIVE RODS RARE GRAM NEGATIVE DIPLOCOCCI Performed at [facility], [place] [address]  [place], [place] [address]    Culture PENDING  Incomplete   Report Status PENDING  Incomplete  Acid Fast Smear (AFB)     Status: None   Collection Time: 10/01/20  8:32 PM   Specimen: Sputum  Result Value Ref Range Status   AFB Specimen Processing Concentration  Final   Acid Fast Smear Negative  Final    Comment: (NOTE) Performed At: [facility] [place], [place] [id number] Rush Farmer MD KM:[id number]    Source (AFB) SPUTUM  Final    Comment: Performed at [facility], [place] [address], [place], [place] [address]    Pertinent Lab. CBC Latest Ref Rng & Units 10/01/2020 09/30/2020 02/18/2018  WBC 4.0 - 10.5 K/uL 5.1 6.1 6.9  Hemoglobin 13.0 - 17.0 g/dL 12.0(L) 13.2 12.8(L)  Hematocrit 39.0 - 52.0 % 37.7(L) 41.6 40.1  Platelets 150 - 400 K/uL 359 376 259   CMP Latest Ref Rng & Units 10/03/2020 10/02/2020 10/01/2020  Glucose 70 - 99 mg/dL 91 78 176(H)  BUN 6 - 20 mg/dL _0 Creatinine 0.61 - 1.24 mg/dL 0.90 0.92 0.77  Sodium 135 - 145 mmol/L 137 137 136  Potassium 3.5 - 5.1 mmol/L 4.3 4.0 4.0  Chloride 98 - 111 mmol/L 105 106 105  CO2 22 - 32 mmol/L _1 Calcium 8.9 - 10.3 mg/dL 8.4(L) 8.3(L) 8.2(L)  Total Protein 6.5 - 8.1 g/dL - - 7.2  Total Bilirubin 0.3 - 1.2 mg/dL - - 0.4  Alkaline Phos 38 - 126 U/L - - 155(H)  AST 15 - 41 U/L - - 18  ALT 0 - 44 U/L - - 8     Pertinent Imaging today Plain films and CT images have been personally visualized and interpreted; radiology reports have been reviewed. Decision making incorporated into the Impression / Recommendations.  I have spent more than 35 minutes for this patient encounter including review of prior medical records, coordination of care  with greater than 50% of time being face to face/counseling and discussing diagnostics/treatment plan with the patient/family.  Electronically signed by:   Rosiland Oz, MD Infectious Disease Physician [facility]  [facility] Pager: [phone]

## 2020-10-04 NOTE — Progress Notes (Signed)
Patient ID: Steven Johnston, male   DOB: [birth year], 59 y.o.   MRN: [id number]  PROGRESS NOTE    Steven Johnston  QMG:[id number] DOB: December 10, 1960 DOA: 09/30/2020 PCP: Patient, No Pcp Per (Inactive)    Brief Narrative:  Steven Johnston is a 60 y.o. male with no known medical history who presented to the ED on 09/30/2020 with complaint of productive cough, 20 pound weight loss, generalized weakness, shortness of breath, Subjective fever progressively worsening for last 2 months.    In the ED, patient was afebrile, oxygen saturation 86% on room air Initial labs unremarkable Chest x-ray on admission showed diffuse bilateral interstitial and alveolar airspace opacities consistent with multifocal pneumonia HIV test sent on admission was reactive. Patient was admitted to hospital service for further evaluation management ID consult obtained   Assessment & Plan:   Principal Problem:   Bilateral pneumonia Active Problems:   HIV disease ([place])   Acute respiratory failure with hypoxia (HCC)   Bilateral interstitial pneumonia (HCC)   Night sweats   Unspecified severe protein-calorie malnutrition ([place])  Multifocal pneumonia -CAP versus pneumocystis jerovicci pneumonia -Presented with shortness of breath, weight loss, fever chills last 2 months -Noted to be HIV positive -Chest x-ray with multifocal pneumonia -Currently on IV Rocephin and azithromycin for CAP coverage -Also on Bactrim and prednisone for possible PJP pneumonia. -Noted a plan from ID to complete 5-day course of IV Rocephin and azithromycin, to complete on 5/5. -PJP pneumonia sputum was negative and ID has asked for bronchoscopy which is scheduled for C5-6.  HIV -Per previous chart, patient was first noted to have positive HIV antibody 2019, lost to follow-up. -HIV active -ID consult appreciated. -Labs and serology in process.  3 samples of AFB smear sent. -ID has started the patient on ART with Biktarvy 1 tab daily.  Acute  hypoxic respiratory failure, POA -Currently on 2 L oxygen by nasal cannula to maintain saturation more than 90%.   -Check ambulatory oxygen requirement.  Tobacco use Recommend cessation  Chronic cocaine use Recommend cessation  Severe protein calorie malnutrition -Significant loss of muscle mass and fat depletion. -Nutrition consult appreciated   DVT prophylaxis: Lovenox SQ Code Status: Full code  Family Communication: Patient at bedside Disposition Plan: Home following completed work-up   Consultants:   Infectious disease  Pulmonary critical care medicine  Procedures:  None  Antimicrobials: Anti-infectives (From admission, onward)   Start     Dose/Rate Route Frequency Ordered Stop   10/03/20 2200  azithromycin (ZITHROMAX) tablet 500 mg        500 mg Oral Daily at bedtime 10/03/20 0919 10/05/20 2159   10/03/20 1200  sulfamethoxazole-trimethoprim (BACTRIM DS) 800-160 MG per tablet 2 tablet        2 tablet Oral Every 8 hours 10/03/20 0852     10/02/20 1000  bictegravir-emtricitabine-tenofovir AF (BIKTARVY) 50-200-25 MG per tablet 1 tablet        1 tablet Oral Daily 10/02/20 0854     10/01/20 2000  cefTRIAXone (ROCEPHIN) 2 g in sodium chloride 0.9 % 100 mL IVPB        2 g 200 mL/hr over 30 Minutes Intravenous Every 24 hours 09/30/20 1938 10/06/20 1959   10/01/20 2000  azithromycin (ZITHROMAX) 500 mg in sodium chloride 0.9 % 250 mL IVPB  Status:  Discontinued        500 mg 250 mL/hr over 60 Minutes Intravenous Every 24 hours 09/30/20 1938 10/03/20 0919   09/30/20 2000  sulfamethoxazole-trimethoprim (BACTRIM) 320 mg in dextrose  5 % 500 mL IVPB  Status:  Discontinued        320 mg 346.7 mL/hr over 90 Minutes Intravenous Every 8 hours 09/30/20 1926 10/03/20 0852   09/30/20 1915  cefTRIAXone (ROCEPHIN) 1 g in sodium chloride 0.9 % 100 mL IVPB        1 g 200 mL/hr over 30 Minutes Intravenous  Once 09/30/20 1910 09/30/20 2002   09/30/20 1915  azithromycin (ZITHROMAX) 500 mg  in sodium chloride 0.9 % 250 mL IVPB        500 mg 250 mL/hr over 60 Minutes Intravenous  Once 09/30/20 1910 09/30/20 2240       Subjective: Feels well today.  Reports ongoing shortness of breath  Objective: Vitals:   10/03/20 0505 10/03/20 1951 10/04/20 0506 10/04/20 0800  BP: 114/86 114/82 115/83   Pulse: 73 82 79 97  Resp: 20 20 20 20   Temp: 98 F (36.7 C) 98.1 F (36.7 C) 97.8 F (36.6 C) 97.8 F (36.6 C)  TempSrc: Oral Oral Oral Oral  SpO2: 94% 91% 93% 100%  Weight:      Height:        Intake/Output Summary (Last 24 hours) at 10/04/2020 1030 Last data filed at 10/04/2020 0335 Gross per 24 hour  Intake --  Output 100 ml  Net -100 ml   Filed Weights   09/30/20 1807  Weight: 63.5 kg    Examination:  General exam: Appears calm and comfortable  Respiratory system: Clear to auscultation. Respiratory effort normal. Cardiovascular system: S1 & S2 heard, RRR.  Gastrointestinal system: Abdomen is nondistended, soft and nontender.  Central nervous system: Alert and oriented. No focal neurological deficits. Extremities: Symmetric  Skin: No rashes Psychiatry: Judgement and insight appear normal. Mood & affect appropriate.     Data Reviewed: I have personally reviewed following labs and imaging studies  CBC: Recent Labs  Lab 09/30/20 1427 10/01/20 0220  WBC 6.1 5.1  HGB 13.2 12.0*  HCT 41.6 37.7*  MCV 84.7 84.0  PLT 376 762   Basic Metabolic Panel: Recent Labs  Lab 09/30/20 1427 10/01/20 0220 10/02/20 0429 10/03/20 0813  NA 136 136 137 137  K 4.3 4.0 4.0 4.3  CL 106 105 106 105  CO2 24 22 22 23   GLUCOSE 82 176* 78 91  BUN 14 13 10 17   CREATININE 0.84 0.77 0.92 0.90  CALCIUM 8.4* 8.2* 8.3* 8.4*   GFR: Estimated Creatinine Clearance: 78.4 mL/min (by C-G formula based on SCr of 0.9 mg/dL). Liver Function Tests: Recent Labs  Lab 10/01/20 0220  AST 18  ALT 8  ALKPHOS 155*  BILITOT 0.4  PROT 7.2  ALBUMIN 2.2*   CBG: Recent Labs  Lab  10/01/20 0800 10/01/20 1131 10/01/20 1541  GLUCAP 100* 160* 134*     Recent Results (from the past 240 hour(s))  Blood culture (routine x 2)     Status: None (Preliminary result)   Collection Time: 09/30/20  1:34 PM   Specimen: BLOOD  Result Value Ref Range Status   Specimen Description BLOOD SITE NOT SPECIFIED  Final   Special Requests   Final    BOTTLES DRAWN AEROBIC AND ANAEROBIC Blood Culture results may not be optimal due to an inadequate volume of blood received in culture bottles   Culture   Final    NO GROWTH 4 DAYS Performed at [facility], [place] [address], [place], [place] [address]    Report Status PENDING  Incomplete  Blood culture (  routine x 2)     Status: None (Preliminary result)   Collection Time: 09/30/20  6:20 PM   Specimen: BLOOD  Result Value Ref Range Status   Specimen Description BLOOD SITE NOT SPECIFIED  Final   Special Requests   Final    BOTTLES DRAWN AEROBIC AND ANAEROBIC Blood Culture results may not be optimal due to an inadequate volume of blood received in culture bottles   Culture   Final    NO GROWTH 4 DAYS Performed at [facility], [place] [address] [place], [place] [address]    Report Status PENDING  Incomplete  Resp Panel by RT-PCR (Flu A&B, Covid) Nasopharyngeal Swab     Status: None   Collection Time: 09/30/20  6:23 PM   Specimen: Nasopharyngeal Swab; Nasopharyngeal(NP) swabs in vial transport medium  Result Value Ref Range Status   SARS Coronavirus 2 by RT PCR NEGATIVE NEGATIVE Final    Comment: (NOTE) SARS-CoV-2 target nucleic acids are NOT DETECTED.  The SARS-CoV-2 RNA is generally detectable in upper respiratory specimens during the acute phase of infection. The lowest concentration of SARS-CoV-2 viral copies this assay can detect is 138 copies/mL. A negative result does not preclude SARS-Cov-2 infection and should not be used as the sole basis for treatment or other patient management decisions. A negative result may  occur with  improper specimen collection/handling, submission of specimen other than nasopharyngeal swab, presence of viral mutation(s) within the areas targeted by this assay, and inadequate number of viral copies(<138 copies/mL). A negative result must be combined with clinical observations, patient history, and epidemiological information. The expected result is Negative.  Fact Sheet for Patients:  EntrepreneurPulse.com.au  Fact Sheet for Healthcare Providers:  IncredibleEmployment.be  This test is no t yet approved or cleared by the [place] FDA and  has been authorized for detection and/or diagnosis of SARS-CoV-2 by FDA under an Emergency Use Authorization (EUA). This EUA will remain  in effect (meaning this test can be used) for the duration of the COVID-19 declaration under Section 564(b)(1) of the Act, 21 U.S.C.section 360bbb-3(b)(1), unless the authorization is terminated  or revoked sooner.       Influenza A by PCR NEGATIVE NEGATIVE Final   Influenza B by PCR NEGATIVE NEGATIVE Final    Comment: (NOTE) The Xpert Xpress SARS-CoV-2/FLU/RSV plus assay is intended as an aid in the diagnosis of influenza from Nasopharyngeal swab specimens and should not be used as a sole basis for treatment. Nasal washings and aspirates are unacceptable for Xpert Xpress SARS-CoV-2/FLU/RSV testing.  Fact Sheet for Patients: EntrepreneurPulse.com.au  Fact Sheet for Healthcare Providers: IncredibleEmployment.be  This test is not yet approved or cleared by the [place] FDA and has been authorized for detection and/or diagnosis of SARS-CoV-2 by FDA under an Emergency Use Authorization (EUA). This EUA will remain in effect (meaning this test can be used) for the duration of the COVID-19 declaration under Section 564(b)(1) of the Act, 21 U.S.C. section 360bbb-3(b)(1), unless the authorization is terminated  or revoked.  Performed at [facility], [place] [address], [place], [place] [address]   Sputum culture     Status: None   Collection Time: 09/30/20  9:55 PM   Specimen: Sputum  Result Value Ref Range Status   Specimen Description SPUTUM  Final   Special Requests Immunocompromised  Final   Sputum evaluation   Final    THIS SPECIMEN IS ACCEPTABLE FOR SPUTUM CULTURE Performed at [facility], [address] [place], [place] [address]    Report Status 10/03/2020 FINAL  Final  Culture, Respiratory w Gram Stain     Status: None (Preliminary result)   Collection Time: 09/30/20  9:55 PM   Specimen: SPU  Result Value Ref Range Status   Specimen Description SPUTUM  Final   Special Requests Immunocompromised Reflexed from [id number]  Final   Gram Stain   Final    NO WBC SEEN RARE SQUAMOUS EPITHELIAL CELLS PRESENT MODERATE YEAST RARE GRAM POSITIVE RODS RARE GRAM NEGATIVE DIPLOCOCCI    Culture   Final    FEW YEAST CULTURE REINCUBATED FOR BETTER GROWTH Performed at [facility], [place] [address] [place], [place] [address]    Report Status PENDING  Incomplete  Acid Fast Smear (AFB)     Status: None   Collection Time: 10/01/20  8:32 PM   Specimen: Sputum  Result Value Ref Range Status   AFB Specimen Processing Concentration  Final   Acid Fast Smear Negative  Final    Comment: (NOTE) Performed At: [facility] [place], [place] [id number] Rush Farmer MD FG:[id number]    Source (AFB) SPUTUM  Final    Comment: Performed at [facility], [place] [address] [place], [place] [address]      Radiology Studies: No results found.   Scheduled Meds: . azithromycin  500 mg Oral QHS  . bictegravir-emtricitabine-tenofovir AF  1 tablet Oral Daily  . enoxaparin (LOVENOX) injection  40 mg Subcutaneous Q24H  . feeding supplement  237 mL Oral TID BM  . predniSONE  40 mg Oral BID WC  . sulfamethoxazole-trimethoprim  2 tablet Oral Q8H   Continuous  Infusions: . cefTRIAXone (ROCEPHIN)  IV 2 g (10/03/20 2100)     LOS: 4 days    Donnamae Jude, MD 10/04/2020 10:30 AM [phone] [facility] If 7PM-7AM, please contact night-coverage 10/04/2020, 10:30 AM

## 2020-10-04 NOTE — H&P (View-Only) (Signed)
Name: Steven Johnston MRN: [id number] DOB: Sep 19, 1960    ADMISSION DATE:  09/30/2020 CONSULTATION DATE: 10/04/2020  REFERRING MD : [facility]  CHIEF COMPLAINT: Increasing dyspnea on exertion x4 weeks in the setting of HIV with no treatment and bilateral pneumonia  BRIEF PATIENT DESCRIPTION:  60 year old cachectic male in no acute distress at rest SIGNIFICANT EVENTS    STUDIES:  CT scan of chest 10/01/2020 with scattered bilateral interstitial groundglass opacities compatible with multifocal atypical pneumonia in an immunocompromised patient.   HISTORY OF PRESENT ILLNESS:  Steven Johnston is a 60 year old long smoker, snorts cocaine 3 times a week for the last 25 years, and presents with a chief complaint of increasing dyspnea on exertion nonproductive cough and weight loss. Of note he had inguinal hernia repair 2019 at which time he was HIV positive but was lost to follow-up.  He has continued smoking to cocaine almost on a daily basis since that time.  Starting approximately 4 weeks ago he had a nonproductive cough was seen in urgent care and treated with mucolytic's and cough suppressants which he proved refractory to.  He was also treated with antimicrobial therapy for bronchitis.  He returned to the hospital chest x-ray showed bilateral airspace disease consistent with pneumonia and again his HIV was checked and was positive.  Pulmonary critical care is asked to see for possible fiberoptic bronchoscopy to determine pulmonary infection type.  PAST MEDICAL HISTORY :   has a past medical history of Hernia, inguinal, right.  has a past surgical history that includes Inguinal hernia repair (Right, 02/17/2018). Prior to Admission medications   Medication Sig Start Date End Date Taking? Authorizing Provider  albuterol (VENTOLIN HFA) 108 (90 Base) MCG/ACT inhaler Inhale 1-2 puffs into the lungs every 6 (six) hours as needed for wheezing or shortness of breath. 08/12/20  Yes Hazel Sams, PA-C   aspirin-acetaminophen-caffeine (EXCEDRIN MIGRAINE) [phone] MG tablet Take 3 tablets by mouth every 6 (six) hours as needed for headache or migraine.   Yes [provider]  naproxen sodium (ALEVE) 220 MG tablet Take 440 mg by mouth daily as needed (pain).   Yes [provider]  ibuprofen (ADVIL) 600 MG tablet Take 1 tablet (600 mg total) by mouth every 6 (six) hours as needed. Patient not taking: Reported on 09/30/2020 03/08/20   Chase Picket, MD  predniSONE (STERAPRED UNI-PAK 21 TAB) 10 MG (21) TBPK tablet Take by mouth daily. Take 6 tabs by mouth daily  for 2 days, then 5 tabs for 2 days, then 4 tabs for 2 days, then 3 tabs for 2 days, 2 tabs for 2 days, then 1 tab by mouth daily for 2 days Patient not taking: Reported on 09/30/2020 08/12/20   Hazel Sams, PA-C  SUMAtriptan (IMITREX) 25 MG tablet Take 1 tablet (25 mg total) by mouth once for 1 dose. May repeat in 2 hours if headache persists or recurs. Patient not taking: Reported on 09/30/2020 03/08/20 03/08/20  Chase Picket, MD   No Known Allergies  FAMILY HISTORY:  family history includes Cancer in his brother. SOCIAL HISTORY:  reports that he has been smoking. He has been smoking about 1.00 pack per day. He has never used smokeless tobacco. He reports current alcohol use. He reports that he does not use drugs.  REVIEW OF SYSTEMS:   Constitutional: Negative for fever, chills,+ weight loss, malaise/fatigue and diaphoresis.  HENT: Negative for hearing loss, ear pain, nosebleeds, congestion, sore throat, neck pain, tinnitus and ear discharge.  Eyes: Negative for blurred vision, double vision, photophobia, pain, discharge and redness.  Respiratory: + cough non productive, DOE x 4 weeks Cardiovascular: Negative for chest pain, palpitations, orthopnea, claudication, leg swelling and PND.  Gastrointestinal: Negative for heartburn, nausea, vomiting, abdominal pain, diarrhea, constipation, blood in stool and melena.   Genitourinary: Negative for dysuria, urgency, frequency, hematuria and flank pain.  Musculoskeletal: Negative for myalgias, back pain, joint pain and falls.  Skin: Negative for itching and rash.  Neurological: Negative for dizziness, tingling, tremors, sensory change, speech change, focal weakness, seizures, loss of consciousness, weakness and headaches.  Endo/Heme/Allergies: Negative for environmental allergies and polydipsia. Does not bruise/bleed easily.  SUBJECTIVE:  60 year old male sitting on side of the bed in no acute distress at rest.  VITAL SIGNS: Temp:  [97.8 F (36.6 C)-98.1 F (36.7 C)] 97.8 F (36.6 C) (05/05 0800) Pulse Rate:  [79-97] 97 (05/05 0800) Resp:  [20] 20 (05/05 0800) BP: (114-115)/(82-83) 115/83 (05/05 0506) SpO2:  [91 %-100 %] 100 % (05/05 0800)  PHYSICAL EXAMINATION: General: Cachectic male in no acute distress at rest on room air Neuro: Grossly intact without focal defect HEENT: Extremely poor dentition multiple caries and multiple missing teeth are noted.  No JVD or lymphadenopathy is appreciated Cardiovascular: Heart sounds are regular regular rate rhythm Lungs: Coarse rhonchi bilaterally Abdomen: Soft positive bowel sounds Musculoskeletal: Intact Skin: No axillary lymphadenopathy is appreciated  Recent Labs  Lab 10/01/20 0220 10/02/20 0429 10/03/20 0813  NA 136 137 137  K 4.0 4.0 4.3  CL 105 106 105  CO2 22 22 23   BUN 13 10 17   CREATININE 0.77 0.92 0.90  GLUCOSE 176* 78 91   Recent Labs  Lab 09/30/20 1427 10/01/20 0220  HGB 13.2 12.0*  HCT 41.6 37.7*  WBC 6.1 5.1  PLT 376 359   No results found.  ASSESSMENT : Principal Problem:   Bilateral pneumonia Active Problems:   HIV disease ([place]) continue treatment x3 years   Acute respiratory failure with hypoxia (HCC)   Bilateral interstitial pneumonia (HCC)   Night sweats   Unspecified severe protein-calorie malnutrition (HCC) Chronic substance abuse Tobacco  abuse    PLAN: Plan for fiberoptic bronchoscopy in the near future.  FOB 10/05/2020 @1230  pm. Appreciate infectious disease input Agree with antimicrobial therapy Stop smoking Stop doing cocaine   Richardson Landry Amey Hossain ACNP Acute Care Nurse Practitioner [place] Please consult Amion 10/04/2020, 10:16 AM

## 2020-10-04 NOTE — Anesthesia Preprocedure Evaluation (Addendum)
Anesthesia Evaluation  Patient identified by MRN, date of birth, ID band Patient awake    Reviewed: Allergy & Precautions, NPO status , Patient's Chart, lab work & pertinent test results  Airway Mallampati: II  TM Distance: >3 FB Neck ROM: Full    Dental  (+) Poor Dentition, Missing, Chipped   Pulmonary pneumonia, unresolved, Current Smoker and Patient abstained from smoking.,    breath sounds clear to auscultation       Cardiovascular negative cardio ROS   Rhythm:Regular Rate:Tachycardia     Neuro/Psych negative neurological ROS     GI/Hepatic negative GI ROS, Neg liver ROS, malnutrition   Endo/Other  negative endocrine ROS  Renal/GU negative Renal ROS     Musculoskeletal negative musculoskeletal ROS (+)   Abdominal Normal abdominal exam  (+)   Peds negative pediatric ROS (+)  Hematology  (+) anemia , HIV,   Anesthesia Other Findings   Reproductive/Obstetrics                           Anesthesia Physical Anesthesia Plan  ASA: III  Anesthesia Plan: General   Post-op Pain Management:    Induction: Intravenous  PONV Risk Score and Plan: 1 and Ondansetron, Treatment may vary due to age or medical condition and Midazolam  Airway Management Planned: Oral ETT  Additional Equipment:   Intra-op Plan:   Post-operative Plan: Extubation in OR  Informed Consent: I have reviewed the patients History and Physical, chart, labs and discussed the procedure including the risks, benefits and alternatives for the proposed anesthesia with the patient or authorized representative who has indicated his/her understanding and acceptance.     Dental advisory given  Plan Discussed with: CRNA and Anesthesiologist  Anesthesia Plan Comments:        Anesthesia Quick Evaluation

## 2020-10-04 NOTE — Plan of Care (Signed)
Patient Denies pain or discomfort. Safety precautions  maintained.

## 2020-10-05 ENCOUNTER — Encounter (HOSPITAL_COMMUNITY): Payer: Self-pay | Admitting: Internal Medicine

## 2020-10-05 ENCOUNTER — Encounter (HOSPITAL_COMMUNITY)
Admission: EM | Disposition: A | Discharge status: Home / Self Care | Payer: Self-pay | Source: Home / Self Care | Attending: Internal Medicine

## 2020-10-05 ENCOUNTER — Inpatient Hospital Stay (HOSPITAL_COMMUNITY): Payer: Self-pay | Admitting: Anesthesiology

## 2020-10-05 HISTORY — PX: BRONCHIAL WASHINGS: SHX5105

## 2020-10-05 HISTORY — PX: VIDEO BRONCHOSCOPY: SHX5072

## 2020-10-05 LAB — CULTURE, BLOOD (ROUTINE X 2): Culture: NO GROWTH

## 2020-10-05 LAB — BASIC METABOLIC PANEL
Anion gap: 9 (ref 5–15)
BUN: 23 mg/dL — ABNORMAL HIGH (ref 6–20)
CO2: 23 mmol/L (ref 22–32)
Calcium: 8.9 mg/dL (ref 8.9–10.3)
Chloride: 100 mmol/L (ref 98–111)
Creatinine, Ser: 1.04 mg/dL (ref 0.61–1.24)
GFR, Estimated: >60 mL/min (ref >60)
Glucose, Bld: 101 mg/dL — ABNORMAL HIGH (ref 70–99)
Potassium: 4.7 mmol/L (ref 3.5–5.1)
Sodium: 132 mmol/L — ABNORMAL LOW (ref 135–145)

## 2020-10-05 LAB — CULTURE, RESPIRATORY W GRAM STAIN: Gram Stain: NONE SEEN

## 2020-10-05 LAB — ACID FAST SMEAR (AFB, MYCOBACTERIA)
Acid Fast Smear: NEGATIVE
Acid Fast Smear: NEGATIVE

## 2020-10-05 SURGERY — VIDEO BRONCHOSCOPY WITHOUT FLUORO
Anesthesia: General

## 2020-10-05 MED ORDER — ONDANSETRON HCL 4 MG/2ML IJ SOLN
INTRAMUSCULAR | Status: DC | PRN
  Administered 2020-10-05: 4 mg via INTRAVENOUS

## 2020-10-05 MED ORDER — SUCCINYLCHOLINE CHLORIDE 200 MG/10ML IV SOSY
PREFILLED_SYRINGE | INTRAVENOUS | Status: DC | PRN
  Administered 2020-10-05: 140 mg via INTRAVENOUS

## 2020-10-05 MED ORDER — PREDNISONE 20 MG PO TABS: 20.0000 mg | ORAL_TABLET | Freq: Every day | ORAL | Status: DC

## 2020-10-05 MED ORDER — OXYCODONE HCL 5 MG/5ML PO SOLN: 5.0000 mg | Freq: Once | ORAL | Status: DC | PRN

## 2020-10-05 MED ORDER — OXYCODONE HCL 5 MG PO TABS: 5.0000 mg | ORAL_TABLET | Freq: Once | ORAL | Status: DC | PRN

## 2020-10-05 MED ORDER — FENTANYL CITRATE (PF) 100 MCG/2ML IJ SOLN: 25.0000 ug | INTRAMUSCULAR | Status: DC | PRN

## 2020-10-05 MED ORDER — PREDNISONE 20 MG PO TABS
40.0000 mg | ORAL_TABLET | Freq: Every day | ORAL | Status: DC
  Administered 2020-10-06: 40 mg via ORAL
  Filled 2020-10-05: qty 2

## 2020-10-05 MED ORDER — PROMETHAZINE HCL 25 MG/ML IJ SOLN: 6.2500 mg | INTRAMUSCULAR | Status: DC | PRN

## 2020-10-05 MED ORDER — LACTATED RINGERS IV SOLN: INTRAVENOUS | Status: DC | PRN

## 2020-10-05 MED ORDER — PROPOFOL 10 MG/ML IV BOLUS
INTRAVENOUS | Status: DC | PRN
  Administered 2020-10-05: 120 mg via INTRAVENOUS

## 2020-10-05 MED ORDER — FENTANYL CITRATE (PF) 250 MCG/5ML IJ SOLN
INTRAMUSCULAR | Status: DC | PRN
  Administered 2020-10-05: 50 ug via INTRAVENOUS

## 2020-10-05 MED ORDER — LIDOCAINE 2% (20 MG/ML) 5 ML SYRINGE
INTRAMUSCULAR | Status: DC | PRN
  Administered 2020-10-05: 80 mg via INTRAVENOUS

## 2020-10-05 NOTE — Progress Notes (Signed)
Patient ID: Steven Johnston, male   DOB: 07-Nov-1960, 60 y.o.   MRN: [id number]  PROGRESS NOTE    Steven Johnston  DVV:[id number] DOB: 04/11/1961 DOA: 09/30/2020 PCP: Patient, No Pcp Per (Inactive)    Brief Narrative:  Steven Johnston a 60 y.o.malewith no known medical history who presented to the ED on 09/30/2020 with complaint of productive cough, 20 pound weight loss, generalized weakness, shortness of breath, Subjective fever progressively worsening for last 2 months.   In the ED, patient was afebrile, oxygen saturation 86% on room air Initial labs unremarkable Chest x-ray on admission showed diffuse bilateral interstitial and alveolar airspace opacities consistent with multifocal pneumonia HIV test sent on admission was reactive. Patient was admitted to hospital service for further evaluation management ID consult obtained   Assessment & Plan:   Principal Problem:   Bilateral pneumonia Active Problems:   HIV disease ([place])   Acute respiratory failure with hypoxia (HCC)   Bilateral interstitial pneumonia (HCC)   Night sweats   Unspecified severe protein-calorie malnutrition ([place])  Multifocal pneumonia -CAP versus pneumocystis jerovicci pneumonia -Presented with shortness of breath, weight loss, fever chills last 2 months -Noted to be HIV positive -Chest x-ray with multifocal pneumonia -s/p V Rocephin and azithromycin for CAP coverage x 5 days, d/c'd now -Also on Bactrim and prednisone for possible PJP pneumonia. -PJP pneumonia sputum was negative and ID has asked for bronchoscopy which is scheduled for today.  HIV -Per previous chart, patient was first noted to have positive HIV antibody 2019, lost to follow-up. -HIV active -IDconsult appreciated. -Labs and serology in process. 3 samples of AFB smear sent. -No on ART with Biktarvy 1 tab daily.  Acute hypoxic respiratory failure, POA -Currently on 2 L oxygen by nasal cannula to maintain saturation more than  90%. -Check ambulatory oxygen requirement.  Tobacco use Recommend cessation  Chronic cocaine use Recommend cessation  Severe protein calorie malnutrition Significant loss of muscle mass and fat depletion. Nutrition consult appreciated   DVT prophylaxis: Lovenox SQ Code Status: Full code  Family Communication: Patient at bedside Disposition Plan: Home following completed w/u   Consultants:   ID  PCCM  Procedures:  Bronchoscopy  Antimicrobials: Anti-infectives (From admission, onward)   Start     Dose/Rate Route Frequency Ordered Stop   10/03/20 2200  azithromycin (ZITHROMAX) tablet 500 mg        500 mg Oral Daily at bedtime 10/03/20 0919 10/04/20 2259   10/03/20 1200  sulfamethoxazole-trimethoprim (BACTRIM DS) 800-160 MG per tablet 2 tablet        2 tablet Oral Every 8 hours 10/03/20 0852     10/02/20 1000  bictegravir-emtricitabine-tenofovir AF (BIKTARVY) 50-200-25 MG per tablet 1 tablet        1 tablet Oral Daily 10/02/20 0854     10/01/20 2000  cefTRIAXone (ROCEPHIN) 2 g in sodium chloride 0.9 % 100 mL IVPB        2 g 200 mL/hr over 30 Minutes Intravenous Every 24 hours 09/30/20 1938 10/06/20 1959   10/01/20 2000  azithromycin (ZITHROMAX) 500 mg in sodium chloride 0.9 % 250 mL IVPB  Status:  Discontinued        500 mg 250 mL/hr over 60 Minutes Intravenous Every 24 hours 09/30/20 1938 10/03/20 0919   09/30/20 2000  sulfamethoxazole-trimethoprim (BACTRIM) 320 mg in dextrose 5 % 500 mL IVPB  Status:  Discontinued        320 mg 346.7 mL/hr over 90 Minutes Intravenous Every 8 hours 09/30/20 1926 10/03/20  7591   09/30/20 1915  cefTRIAXone (ROCEPHIN) 1 g in sodium chloride 0.9 % 100 mL IVPB        1 g 200 mL/hr over 30 Minutes Intravenous  Once 09/30/20 1910 09/30/20 2002   09/30/20 1915  azithromycin (ZITHROMAX) 500 mg in sodium chloride 0.9 % 250 mL IVPB        500 mg 250 mL/hr over 60 Minutes Intravenous  Once 09/30/20 1910 09/30/20 2240        Subjective: Patient reports breathing feels somewhat better today  Objective: Vitals:   10/04/20 0506 10/04/20 0800 10/04/20 2222 10/05/20 0547  BP: 115/83 125/79 112/88 120/79  Pulse: 79 97 77 65  Resp: 20 20 20 18   Temp: 97.8 F (36.6 C) 97.8 F (36.6 C) 97.9 F (36.6 C) 97.9 F (36.6 C)  TempSrc: Oral Oral    SpO2: 93% 100% 91% 92%  Weight:      Height:        Intake/Output Summary (Last 24 hours) at 10/05/2020 0923 Last data filed at 10/05/2020 0649 Gross per 24 hour  Intake --  Output 675 ml  Net -675 ml   Filed Weights   09/30/20 1807  Weight: 63.5 kg    Examination:  General exam: Appears calm and comfortable  Respiratory system: Clear to auscultation. Respiratory effort normal. Cardiovascular system: S1 & S2 heard, RRR.  Gastrointestinal system: Abdomen is nondistended, soft and nontender.  Central nervous system: Alert and oriented. No focal neurological deficits. Extremities: Symmetric  Skin: No rashes Psychiatry: Judgement and insight appear normal. Mood & affect appropriate.     Data Reviewed: I have personally reviewed following labs and imaging studies  CBC: Recent Labs  Lab 09/30/20 1427 10/01/20 0220  WBC 6.1 5.1  HGB 13.2 12.0*  HCT 41.6 37.7*  MCV 84.7 84.0  PLT 376 638   Basic Metabolic Panel: Recent Labs  Lab 09/30/20 1427 10/01/20 0220 10/02/20 0429 10/03/20 0813 10/05/20 0409  NA 136 136 137 137 132*  K 4.3 4.0 4.0 4.3 4.7  CL 106 105 106 105 100  CO2 24 22 22 23 23   GLUCOSE 82 176* 78 91 101*  BUN 14 13 10 17  23*  CREATININE 0.84 0.77 0.92 0.90 1.04  CALCIUM 8.4* 8.2* 8.3* 8.4* 8.9   GFR: Estimated Creatinine Clearance: 67.8 mL/min (by C-G formula based on SCr of 1.04 mg/dL). Liver Function Tests: Recent Labs  Lab 10/01/20 0220  AST 18  ALT 8  ALKPHOS 155*  BILITOT 0.4  PROT 7.2  ALBUMIN 2.2*   CBG: Recent Labs  Lab 10/01/20 0800 10/01/20 1131 10/01/20 1541  GLUCAP 100* 160* 134*     Recent  Results (from the past 240 hour(s))  Blood culture (routine x 2)     Status: None   Collection Time: 09/30/20  1:34 PM   Specimen: BLOOD  Result Value Ref Range Status   Specimen Description BLOOD SITE NOT SPECIFIED  Final   Special Requests   Final    BOTTLES DRAWN AEROBIC AND ANAEROBIC Blood Culture results may not be optimal due to an inadequate volume of blood received in culture bottles   Culture   Final    NO GROWTH 5 DAYS Performed at [facility], [place] [address], [place], [place] [address]    Report Status 10/05/2020 FINAL  Final  Blood culture (routine x 2)     Status: None   Collection Time: 09/30/20  6:20 PM   Specimen: BLOOD  Result  Value Ref Range Status   Specimen Description BLOOD SITE NOT SPECIFIED  Final   Special Requests   Final    BOTTLES DRAWN AEROBIC AND ANAEROBIC Blood Culture results may not be optimal due to an inadequate volume of blood received in culture bottles   Culture   Final    NO GROWTH 5 DAYS Performed at [facility], [place] [address] [place], [place] [address]    Report Status 10/05/2020 FINAL  Final  Resp Panel by RT-PCR (Flu A&B, Covid) Nasopharyngeal Swab     Status: None   Collection Time: 09/30/20  6:23 PM   Specimen: Nasopharyngeal Swab; Nasopharyngeal(NP) swabs in vial transport medium  Result Value Ref Range Status   SARS Coronavirus 2 by RT PCR NEGATIVE NEGATIVE Final    Comment: (NOTE) SARS-CoV-2 target nucleic acids are NOT DETECTED.  The SARS-CoV-2 RNA is generally detectable in upper respiratory specimens during the acute phase of infection. The lowest concentration of SARS-CoV-2 viral copies this assay can detect is 138 copies/mL. A negative result does not preclude SARS-Cov-2 infection and should not be used as the sole basis for treatment or other patient management decisions. A negative result may occur with  improper specimen collection/handling, submission of specimen other than nasopharyngeal swab, presence of  viral mutation(s) within the areas targeted by this assay, and inadequate number of viral copies(<138 copies/mL). A negative result must be combined with clinical observations, patient history, and epidemiological information. The expected result is Negative.  Fact Sheet for Patients:  EntrepreneurPulse.com.au  Fact Sheet for Healthcare Providers:  IncredibleEmployment.be  This test is no t yet approved or cleared by the [place] FDA and  has been authorized for detection and/or diagnosis of SARS-CoV-2 by FDA under an Emergency Use Authorization (EUA). This EUA will remain  in effect (meaning this test can be used) for the duration of the COVID-19 declaration under Section 564(b)(1) of the Act, 21 U.S.C.section 360bbb-3(b)(1), unless the authorization is terminated  or revoked sooner.       Influenza A by PCR NEGATIVE NEGATIVE Final   Influenza B by PCR NEGATIVE NEGATIVE Final    Comment: (NOTE) The Xpert Xpress SARS-CoV-2/FLU/RSV plus assay is intended as an aid in the diagnosis of influenza from Nasopharyngeal swab specimens and should not be used as a sole basis for treatment. Nasal washings and aspirates are unacceptable for Xpert Xpress SARS-CoV-2/FLU/RSV testing.  Fact Sheet for Patients: EntrepreneurPulse.com.au  Fact Sheet for Healthcare Providers: IncredibleEmployment.be  This test is not yet approved or cleared by the [place] FDA and has been authorized for detection and/or diagnosis of SARS-CoV-2 by FDA under an Emergency Use Authorization (EUA). This EUA will remain in effect (meaning this test can be used) for the duration of the COVID-19 declaration under Section 564(b)(1) of the Act, 21 U.S.C. section 360bbb-3(b)(1), unless the authorization is terminated or revoked.  Performed at [facility], [place] [address], [place], [place] [address]   Sputum culture     Status:  None   Collection Time: 09/30/20  9:55 PM   Specimen: Sputum  Result Value Ref Range Status   Specimen Description SPUTUM  Final   Special Requests Immunocompromised  Final   Sputum evaluation   Final    THIS SPECIMEN IS ACCEPTABLE FOR SPUTUM CULTURE Performed at [facility], [address] [place], [place] [address]    Report Status 10/03/2020 FINAL  Final  Culture, Respiratory w Gram Stain     Status:  None (Preliminary result)   Collection Time: 09/30/20  9:55 PM   Specimen: SPU  Result Value Ref Range Status   Specimen Description SPUTUM  Final   Special Requests Immunocompromised Reflexed from [id number]  Final   Gram Stain   Final    NO WBC SEEN RARE SQUAMOUS EPITHELIAL CELLS PRESENT MODERATE YEAST RARE GRAM POSITIVE RODS RARE GRAM NEGATIVE DIPLOCOCCI    Culture   Final    FEW YEAST CULTURE REINCUBATED FOR BETTER GROWTH Performed at [facility], [place] [address], [place], [place] [address]    Report Status PENDING  Incomplete  Acid Fast Smear (AFB)     Status: None   Collection Time: 10/01/20  8:32 PM   Specimen: Sputum  Result Value Ref Range Status   AFB Specimen Processing Concentration  Final   Acid Fast Smear Negative  Final    Comment: (NOTE) Performed At: [facility] [place], [place] [id number] Rush Farmer MD RC:[id number]    Source (AFB) SPUTUM  Final    Comment: Performed at [facility], [place] [address] [place], [place] [address]  Blastomyces Antigen     Status: None   Collection Time: 10/02/20  7:43 AM   Specimen: Urine  Result Value Ref Range Status   Blastomyces Antigen None Detected None Detected ng/mL Final    Comment: (NOTE) Results reported as ng/mL in 0.2 - 14.7 ng/mL range Results above the limit of detection but below 0.2 ng/mL are reported as 'Positive, Below the Limit of Quantification' Results above 14.7 ng/mL are reported as 'Positive, Above the Limit of Quantification'    Specimen Type  SERUM  Final    Comment: (NOTE) Performed At: [place], [place] [id number] Bruce Donath MD [id number]   Acid Fast Smear (AFB)     Status: None   Collection Time: 10/04/20  3:54 AM   Specimen: Sputum  Result Value Ref Range Status   AFB Specimen Processing Concentration  Final   Acid Fast Smear Negative  Final    Comment: (NOTE) Performed At: [facility] [address] [place], [place] [id number] Rush Farmer MD TO:[id number]    Source (AFB) SPUTUM  Final    Comment: Performed at [facility], [place] [address] [place], [place] [address]  Acid Fast Smear (AFB)     Status: None   Collection Time: 10/04/20  3:54 AM   Specimen: Sputum  Result Value Ref Range Status   AFB Specimen Processing Concentration  Final   Acid Fast Smear Negative  Final    Comment: (NOTE) Performed At: [facility] [place], [place] [id number] Rush Farmer MD [id number]    Source (AFB) SPUTUM  Final    Comment: Performed at [facility], [place] [address] [place], [place] [address]      Radiology Studies: No results found.   Scheduled Meds: . bictegravir-emtricitabine-tenofovir AF  1 tablet Oral Daily  . enoxaparin (LOVENOX) injection  40 mg Subcutaneous Q24H  . feeding supplement  237 mL Oral TID BM  . [START ON 10/06/2020] predniSONE  40 mg Oral Q breakfast   Followed by  . [START ON 10/11/2020] predniSONE  20 mg Oral Q breakfast  . predniSONE  40 mg Oral BID WC  . sulfamethoxazole-trimethoprim  2 tablet Oral Q8H   Continuous Infusions: . cefTRIAXone (ROCEPHIN)  IV Stopped (10/05/20 0736)     LOS: 5 days    Lavella Lemons  Virginia Crews, MD 10/05/2020 9:23 AM [phone] [facility] If 7PM-7AM, please contact night-coverage 10/05/2020, 9:23 AM

## 2020-10-05 NOTE — Op Note (Signed)
Video Bronchoscopy Procedure Note  Date of Operation: 10/05/2020  Pre-op Diagnosis: HIV, bilateral pneumonia  Post-op Diagnosis: Same  Surgeon: Julian Hy  Assistants: none  Anesthesia: deep sedation  Meds Given: per anesthesia records  Operation: Flexible video fiberoptic bronchoscopy and BAL  Estimated Blood Loss: 0cc  Complications: none noted  Indications and History: Steven Johnston is 60 y.o. with history of HIV and acute bilateral pneumonia.  Recommendation was to perform video fiberoptic bronchoscopy with biopsies. The risks, benefits, complications, treatment options and expected outcomes were discussed with the patient.  The possibilities of pneumothorax, pneumonia, reaction to medication, pulmonary aspiration, perforation of a viscus, bleeding, failure to diagnose a condition and creating a complication requiring transfusion or operation were discussed with the patient who freely signed the consent.    Description of Procedure: The patient was seen in the bronchoscopy suite, was examined and was deemed appropriate to proceed.  The patient was identified as Steven Johnston and the procedure verified as Flexible Video Fiberoptic Bronchoscopy.  A Time Out was held and the above information confirmed.   Conscious sedation was initiated as indicated above. The video fiberoptic bronchoscope was introduced via the endotracheal tube and a general inspection was performed which showed normal distal trachea, normal main carina. The R sided airways were inspected and showed normal RUL, BI, RML and RLL. The L side was then inspected. The LLL, Lingular and LUL airways were normal.   The lateral right middle lobe had 120 cc of saline instilled with return of 20 cc.  This will be sent for pathology.  An additional 120 cc of saline were instilled with minimal return.  An additional 120 cc of saline was instilled into the medial right middle lobe with return of 25cc to be sent for  microbiology studies.  Airways very collapsible with suctioning.  Return fluid was clear.  Samples: 1. BAL RML for GMS stain 2. BAL RML for micro studies-- fungal, AFB, routine respiratory.  Plans:  We will review the cytology microbiology results with the patient when they become available.  Outpatient followup will be with infectious disease. Dr. [place] Bali copied on all results.   Julian Hy, DO 10/05/20 1:31 PM [facility]

## 2020-10-05 NOTE — Progress Notes (Signed)
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 Planned for Bronch today    ID work up: 5/1 sputum cx few candida albicans  10/01/20 Quantiferon 5/2 indeterminate  5/2/22Cryptococcal ag negative 5/2 RPR negative  5/2 Fungitell >500 5/2 sputum AFB smear negative 5/5 2 sputum AFB smear negative ( same sample) 10/01/20 Legionella pneumophilia ag urine negative   Assessment Chronic cough and dyspnea, possible PCP pneumonia versus others.  Clinically improving on empiric PJP treatment. He has been off oxygen most of the time yesterday briefly while walking   Night sweats and weight loss, indeterminate QuantiFERON rule out TB 3 AFB sputum smear has been negative.  AFB Cultures pending Planned for diagnostic bronchoscopy and BAL with pulm today.  Greatly appreciate their help  AIDS H/o Prior Hepatitis B Infection    Recommendations Completed 5 days worth of ceftriaxone and azithromycin yesterday Continue p.o. Bactrim and prednisone p.o. for empiric treatment of PJP pneumonia.  Fungitell is more than 500 which which is consistent with PJP pneumonia Monitor Cr on bactrim  Follow-up bronchoscopy/BAL cultures and path Airborne precautions can be dc'ed if BAL AFB smear are negative. (  Although patient has 3 negative AFB smears, the 2 negative smears on 5/2 are collected from the same sputum sample ) Continue Biktarvy, has been tolerating it so far without any evidence of IRIS. He told me he will tell his girlfriend about his AIDs diagnosis. A follow-up appointment with [facility] with myself has been made on 5/17 at 10:30 am   Dr. Baxter Flattery will follow up on BAL cultures.  New ID team will follow up on Monday  Rest of the management as per the primary team. Thank you for the consult. Please page with pertinent questions or concerns.  ______________________________________________________________________ Subjective patient seen and examined at the bedside.  Lying in bed, on room air.  Cough is improving as well as shortness of breath   Vitals BP 120/79 (BP Location: Left Arm)   Pulse 65   Temp 97.9 F (36.6 C)   Resp 18   Ht _0  (1.753 m)   Wt 63.5 kg   SpO2 92%   BMI 20.67 kg/m     Physical Exam Constitutional:   Not in acute distress, looks comfortable    Comments:   Cardiovascular:     Rate and Rhythm: Normal rate and regular rhythm.     Heart sounds: No murmur heard.   Pulmonary:     Effort: Pulmonary effort is normal.     Comments: Clear lung sounds  Abdominal:     Palpations: Abdomen is soft.     Tenderness: Nontender and bowel sounds present  Musculoskeletal:        General: No swelling or tenderness.   Skin:    Comments: No obvious lesions or rashes  Neurological:     General: No focal deficit present.   Psychiatric:  Mood and Affect: Mood normal.   Pertinent Microbiology Results for orders placed or performed during the hospital encounter of 09/30/20  Blood culture (routine x 2)     Status: None (Preliminary result)   Collection Time: 09/30/20  1:34 PM   Specimen: BLOOD  Result Value Ref Range Status   Specimen Description BLOOD SITE NOT SPECIFIED  Final   Special Requests   Final    BOTTLES DRAWN AEROBIC AND ANAEROBIC Blood  Culture results may not be optimal due to an inadequate volume of blood received in culture bottles   Culture   Final    NO GROWTH 4 DAYS Performed at [facility], [place] [address], [place], [place] [address]    Report Status PENDING  Incomplete  Blood culture (routine x 2)     Status: None (Preliminary result)   Collection Time: 09/30/20  6:20 PM   Specimen: BLOOD  Result Value Ref Range Status   Specimen Description BLOOD SITE NOT SPECIFIED  Final   Special Requests   Final    BOTTLES DRAWN AEROBIC AND ANAEROBIC Blood Culture results may not be optimal due to an inadequate volume of blood received in culture bottles   Culture   Final    NO GROWTH 4 DAYS Performed at [facility], [place] [address] [place], [place] [address]    Report Status PENDING  Incomplete  Resp Panel by RT-PCR (Flu A&B, Covid) Nasopharyngeal Swab     Status: None   Collection Time: 09/30/20  6:23 PM   Specimen: Nasopharyngeal Swab; Nasopharyngeal(NP) swabs in vial transport medium  Result Value Ref Range Status   SARS Coronavirus 2 by RT PCR NEGATIVE NEGATIVE Final    Comment: (NOTE) SARS-CoV-2 target nucleic acids are NOT DETECTED.  The SARS-CoV-2 RNA is generally detectable in upper respiratory specimens during the acute phase of infection. The lowest concentration of SARS-CoV-2 viral copies this assay can detect is 138 copies/mL. A negative result does not preclude SARS-Cov-2 infection and should not be used as the sole basis for treatment or other patient management decisions. A negative result may occur with  improper specimen collection/handling, submission of specimen other than nasopharyngeal swab, presence of viral mutation(s) within the areas targeted by this assay, and inadequate number of viral copies(<138 copies/mL). A negative result must be combined with clinical observations, patient history, and epidemiological information. The expected result is Negative.  Fact Sheet for  Patients:  EntrepreneurPulse.com.au  Fact Sheet for Healthcare Providers:  IncredibleEmployment.be  This test is no t yet approved or cleared by the [place] FDA and  has been authorized for detection and/or diagnosis of SARS-CoV-2 by FDA under an Emergency Use Authorization (EUA). This EUA will remain  in effect (meaning this test can be used) for the duration of the COVID-19 declaration under Section 564(b)(1) of the Act, 21 U.S.C.section 360bbb-3(b)(1), unless the authorization is terminated  or revoked sooner.       Influenza A by PCR NEGATIVE NEGATIVE Final   Influenza B by PCR NEGATIVE NEGATIVE Final    Comment: (NOTE) The Xpert Xpress SARS-CoV-2/FLU/RSV plus assay is intended as an aid in the diagnosis of influenza from Nasopharyngeal swab specimens and should not be used as a sole basis for treatment. Nasal washings and aspirates are unacceptable for Xpert Xpress SARS-CoV-2/FLU/RSV testing.  Fact Sheet for Patients: EntrepreneurPulse.com.au  Fact Sheet for Healthcare Providers: IncredibleEmployment.be  This test is not yet approved or cleared by the [place] FDA and has been  authorized for detection and/or diagnosis of SARS-CoV-2 by FDA under an Emergency Use Authorization (EUA). This EUA will remain in effect (meaning this test can be used) for the duration of the COVID-19 declaration under Section 564(b)(1) of the Act, 21 U.S.C. section 360bbb-3(b)(1), unless the authorization is terminated or revoked.  Performed at [facility], [place] [address], [place], [place] [address]   Sputum culture     Status: None   Collection Time: 09/30/20  9:55 PM   Specimen: Sputum  Result Value Ref Range Status   Specimen Description SPUTUM  Final   Special Requests Immunocompromised  Final   Sputum evaluation   Final    THIS SPECIMEN IS ACCEPTABLE FOR SPUTUM CULTURE Performed at [facility], [address] [place], [place] [address]    Report Status 10/03/2020 FINAL  Final  Culture, Respiratory w Gram Stain     Status: None (Preliminary result)   Collection Time: 09/30/20  9:55 PM   Specimen: SPU  Result Value Ref Range Status   Specimen Description SPUTUM  Final   Special Requests Immunocompromised Reflexed from [id number]  Final   Gram Stain   Final    NO WBC SEEN RARE SQUAMOUS EPITHELIAL CELLS PRESENT MODERATE YEAST RARE GRAM POSITIVE RODS RARE GRAM NEGATIVE DIPLOCOCCI    Culture   Final    FEW YEAST CULTURE REINCUBATED FOR BETTER GROWTH Performed at [facility], [place] [address], [place], [place] [address]    Report Status PENDING  Incomplete  Acid Fast Smear (AFB)     Status: None   Collection Time: 10/01/20  8:32 PM   Specimen: Sputum  Result Value Ref Range Status   AFB Specimen Processing Concentration  Final   Acid Fast Smear Negative  Final    Comment: (NOTE) Performed At: [facility] [place], [place] [id number] Rush Farmer MD AN:[id number]    Source (AFB) SPUTUM  Final    Comment: Performed at [facility], [place] [address] [place], [place] [address]  Blastomyces Antigen     Status: None   Collection Time: 10/02/20  7:43 AM   Specimen: Urine  Result Value Ref Range Status   Blastomyces Antigen None Detected None Detected ng/mL Final    Comment: (NOTE) Results reported as ng/mL in 0.2 - 14.7 ng/mL range Results above the limit of detection but below 0.2 ng/mL are reported as 'Positive, Below the Limit of Quantification' Results above 14.7 ng/mL are reported as 'Positive, Above the Limit of Quantification'    Specimen Type SERUM  Final    Comment: (NOTE) Performed At: [place], [place] [id number] Bruce Donath MD YE:[id number]   Acid Fast Smear (AFB)     Status: None   Collection Time: 10/04/20  3:54 AM   Specimen: Sputum  Result Value Ref Range Status    AFB Specimen Processing Concentration  Final   Acid Fast Smear Negative  Final    Comment: (NOTE) Performed At: [facility] [address] [place], [place] [id number] Rush Farmer MD DB:[id number]    Source (AFB) SPUTUM  Final    Comment: Performed at [facility], [place] [address], [place], [place] [address]  Acid Fast Smear (AFB)     Status: None   Collection Time: 10/04/20  3:54 AM   Specimen: Sputum  Result Value Ref Range Status   AFB Specimen Processing Concentration  Final   Acid Fast Smear  Negative  Final    Comment: (NOTE) Performed At: [facility] [place], [place] [id number] Rush Farmer MD VO:[id number]    Source (AFB) SPUTUM  Final    Comment: Performed at [facility], [place] [address], [place], [place] [address]      Pertinent Lab. CBC Latest Ref Rng & Units 10/01/2020 09/30/2020 02/18/2018  WBC 4.0 - 10.5 K/uL 5.1 6.1 6.9  Hemoglobin 13.0 - 17.0 g/dL 12.0(L) 13.2 12.8(L)  Hematocrit 39.0 - 52.0 % 37.7(L) 41.6 40.1  Platelets 150 - 400 K/uL 359 376 259   CMP Latest Ref Rng & Units 10/05/2020 10/03/2020 10/02/2020  Glucose 70 - 99 mg/dL 101(H) 91 78  BUN 6 - 20 mg/dL 23(H) 17 10  Creatinine 0.61 - 1.24 mg/dL 1.04 0.90 0.92  Sodium 135 - 145 mmol/L 132(L) 137 137  Potassium 3.5 - 5.1 mmol/L 4.7 4.3 4.0  Chloride 98 - 111 mmol/L 100 105 106  CO2 22 - 32 mmol/L _0 Calcium 8.9 - 10.3 mg/dL 8.9 8.4(L) 8.3(L)  Total Protein 6.5 - 8.1 g/dL - - -  Total Bilirubin 0.3 - 1.2 mg/dL - - -  Alkaline Phos 38 - 126 U/L - - -  AST 15 - 41 U/L - - -  ALT 0 - 44 U/L - - -    Pertinent Imaging today Plain films and CT images have been personally visualized and interpreted; radiology reports have been reviewed. Decision making incorporated into the Impression / Recommendations.  I have spent more than 35 minutes for this patient encounter including review of prior medical records, coordination of care  with greater than 50% of  time being face to face/counseling and discussing diagnostics/treatment plan with the patient/family.  Electronically signed by:   Rosiland Oz, MD Infectious Disease Physician [facility] Pager: [phone]

## 2020-10-05 NOTE — Anesthesia Procedure Notes (Signed)
Procedure Name: Intubation Date/Time: 10/05/2020 1:07 PM Performed by: Dorthea Cove, CRNA Pre-anesthesia Checklist: Patient identified, Emergency Drugs available, Suction available and Patient being monitored Patient Re-evaluated:Patient Re-evaluated prior to induction Oxygen Delivery Method: Circle system utilized Preoxygenation: Pre-oxygenation with 100% oxygen Induction Type: IV induction Ventilation: Two handed mask ventilation required Laryngoscope Size: Mac and 4 Grade View: Grade II Tube type: Oral Tube size: 8.5 mm Number of attempts: 1 Airway Equipment and Method: Stylet and Oral airway Placement Confirmation: ETT inserted through vocal cords under direct vision,  positive ETCO2 and breath sounds checked- equal and bilateral Secured at: 23 cm Tube secured with: Tape Dental Injury: Teeth and Oropharynx as per pre-operative assessment

## 2020-10-05 NOTE — Plan of Care (Signed)

## 2020-10-05 NOTE — Interval H&P Note (Signed)
History and Physical Interval Note:  10/05/2020 12:37 PM  Steven Johnston  has presented today for surgery, with the diagnosis of pneumonia.  The various methods of treatment have been discussed with the patient and family. After consideration of risks, benefits and other options for treatment, the patient has consented to  Procedure(s): VIDEO BRONCHOSCOPY WITHOUT FLUORO (N/A) as a surgical intervention.  The patient's history has been reviewed, patient examined, no change in status, stable for surgery.  I have reviewed the patient's chart and labs.  Questions were answered to the patient's satisfaction.    NPO since midnight. No therapeutic blood thinners. No change in symptoms since yesterday. We discussed the indications, risk, and benefits of bronchoscopy. All questions were answered. He signed the consent which was witnessed. Planning to proceed with endoscopy.   BP 120/79 (BP Location: Left Arm)   Pulse 65   Temp 97.9 F (36.6 C)   Resp 18   Ht 5\' 9"  (1.753 m)   Wt 63.5 kg   SpO2 92%   BMI 20.67 kg/m  Awake, alert, answering questions appropriately. Sitting up in the wheelchair. CTAB, breathing comfortably on RA. S1S2, RRR.   Julian Hy, DO 10/05/20 12:39 PM [facility]    Julian Hy

## 2020-10-05 NOTE — Transfer of Care (Signed)
Immediate Anesthesia Transfer of Care Note  Patient: Steven Johnston  Procedure(s) Performed: VIDEO BRONCHOSCOPY WITHOUT FLUORO (N/A ) BRONCHIAL WASHINGS  Patient Location: Endoscopy Unit  Anesthesia Type:General  Level of Consciousness: awake, alert  and oriented  Airway & Oxygen Therapy: Patient Spontanous Breathing  Post-op Assessment: Report given to RN and Post -op Vital signs reviewed and stable  Post vital signs: Reviewed and stable  Last Vitals:  Vitals Value Taken Time  BP 127/93   Temp    Pulse 77   Resp 12   SpO2 95     Last Pain:  Vitals:   10/05/20 1248  TempSrc: Temporal  PainSc: 0-No pain      Patients Stated Pain Goal: 0 (80/99/83 3825)  Complications: No complications documented.

## 2020-10-06 DIAGNOSIS — B59 Pneumocystosis: Secondary | ICD-10-CM

## 2020-10-06 LAB — ACID FAST SMEAR (AFB, MYCOBACTERIA): Acid Fast Smear: NEGATIVE

## 2020-10-06 LAB — BASIC METABOLIC PANEL
Anion gap: 9 (ref 5–15)
BUN: 23 mg/dL — ABNORMAL HIGH (ref 6–20)
CO2: 24 mmol/L (ref 22–32)
Calcium: 8.4 mg/dL — ABNORMAL LOW (ref 8.9–10.3)
Chloride: 99 mmol/L (ref 98–111)
Creatinine, Ser: 0.98 mg/dL (ref 0.61–1.24)
GFR, Estimated: >60 mL/min (ref >60)
Glucose, Bld: 115 mg/dL — ABNORMAL HIGH (ref 70–99)
Potassium: 5.4 mmol/L — ABNORMAL HIGH (ref 3.5–5.1)
Sodium: 132 mmol/L — ABNORMAL LOW (ref 135–145)

## 2020-10-06 LAB — QUANTIFERON-TB GOLD PLUS: QuantiFERON-TB Gold Plus: UNDETERMINED — AB

## 2020-10-06 LAB — QUANTIFERON-TB GOLD PLUS (RQFGPL)
QuantiFERON Mitogen Value: 0.02 IU/mL
QuantiFERON Nil Value: 0 IU/mL
QuantiFERON TB1 Ag Value: 0 IU/mL
QuantiFERON TB2 Ag Value: 0 IU/mL

## 2020-10-06 MED ORDER — PREDNISONE 20 MG PO TABS: 40.0000 mg | ORAL_TABLET | Freq: Every day | ORAL | 0 refills | Status: DC

## 2020-10-06 MED ORDER — AZITHROMYCIN 600 MG PO TABS
1200.0000 mg | ORAL_TABLET | Freq: Once | ORAL | Status: AC
  Administered 2020-10-06: 1200 mg via ORAL
  Filled 2020-10-06: qty 2

## 2020-10-06 MED ORDER — ALBUTEROL SULFATE HFA 108 (90 BASE) MCG/ACT IN AERS: 1.0000 | INHALATION_SPRAY | Freq: Four times a day (QID) | RESPIRATORY_TRACT | 1 refills | Status: DC | PRN

## 2020-10-06 MED ORDER — BICTEGRAVIR-EMTRICITAB-TENOFOV 50-200-25 MG PO TABS: 1.0000 | ORAL_TABLET | Freq: Every day | ORAL | 0 refills | Status: DC

## 2020-10-06 MED ORDER — SULFAMETHOXAZOLE-TRIMETHOPRIM 800-160 MG PO TABS: 2.0000 | ORAL_TABLET | Freq: Three times a day (TID) | ORAL | 0 refills | Status: AC

## 2020-10-06 MED ORDER — AZITHROMYCIN 600 MG PO TABS: 1200.0000 mg | ORAL_TABLET | ORAL | 0 refills | Status: DC

## 2020-10-06 MED ORDER — SODIUM ZIRCONIUM CYCLOSILICATE 10 G PO PACK
10.0000 g | PACK | Freq: Once | ORAL | Status: AC
  Administered 2020-10-06: 10 g via ORAL
  Filled 2020-10-06: qty 1

## 2020-10-06 NOTE — Discharge Summary (Signed)
Physician Discharge Summary  Steven Johnston WYO:[id number] DOB: 11-Jun-1960 DOA: 09/30/2020  PCP: Patient, No Pcp Per (Inactive)  Admit date: 09/30/2020 Discharge date: 10/06/2020  Admitted From: home Disposition:  home  Recommendations for Outpatient Follow-up:  1. Follow up with PCP in 1-2 weeks 2. Follow-up with infectious disease as an outpatient as scheduled  Home Health: none Equipment/Devices: none  Discharge Condition: stable CODE STATUS: Full code Diet recommendation: regular  HPI: Per admitting MD, Steven Johnston is a 60 y.o. male who presents to the ED with c/o 1-2 months of cough, productive of clear phlegm, 20lb wt loss, generalized weakness, SOB, night sweats, subjective fevers. Symptoms moderate, persistent, progressively worsening. No known ill contacts. Back in 2019 when pt was admitted for hernia repair, he apparently had an HIV1 AB test that came back positive.  Looks like a telephone note was made a few days after discharge that this information was sent to DIS to "track patient down and bring to care" for this.  But per patient he never was informed.  Hospital Course / Discharge diagnoses: Principal problem Multifocal pneumonia -CAP versus pneumocystis jerovicci pneumonia -Presented with shortness of breath, weight loss, fever chills last 2 months, he was also noted to be HIV positive.  Chest x-ray on admission showed multifocal pneumonia.  He was started on broad-spectrum antibiotics with ceftriaxone and azithromycin and completed 5 days while hospitalized.  Due to elevated Fungitell he is also being treated for PCP pneumonia with Bactrim and steroids given hypoxia.  Pulmonology was consulted and underwent a bronchoscopy on 5/6, final results are pending at the time of discharge.  He has improved significantly, he is on room air, discussed with ID and will be discharged home in stable condition to complete PCP pneumonia treatment with Bactrim for total 21 days,  prednisone taper, and also on azithromycin for prophylaxis given HIV / AIDS  Active problems HIV / AIDS -Per previous chart, patient was first noted to have positive HIV antibody 2019, lost to follow-up. HIV active. IDconsult appreciated. Continue Biktarvy, sample provided by ID upon discharge. Acute hypoxic respiratory failure, POA -currently on room air Tobacco use -  Recommend cessation Chronic cocaine use -Recommend cessation Severe protein calorie malnutrition -Significant loss of muscle mass and fat depletion.  Sepsis ruled out   Discharge Instructions   Allergies as of 10/06/2020   No Known Allergies     Medication List    STOP taking these medications   aspirin-acetaminophen-caffeine 250-250-65 MG tablet Commonly known as: EXCEDRIN MIGRAINE   predniSONE 10 MG (21) Tbpk tablet Commonly known as: STERAPRED UNI-PAK 21 TAB Replaced by: predniSONE 20 MG tablet   SUMAtriptan 25 MG tablet Commonly known as: Imitrex     TAKE these medications   albuterol 108 (90 Base) MCG/ACT inhaler Commonly known as: VENTOLIN HFA Inhale 1-2 puffs into the lungs every 6 (six) hours as needed for wheezing or shortness of breath.   azithromycin 600 MG tablet Commonly known as: ZITHROMAX Take 2 tablets (1,200 mg total) by mouth once a week. Start taking on: Oct 10, 2020   bictegravir-emtricitabine-tenofovir AF 50-200-25 MG Tabs tablet Commonly known as: BIKTARVY Take 1 tablet by mouth daily. Start taking on: Oct 07, 2020   ibuprofen 600 MG tablet Commonly known as: ADVIL Take 1 tablet (600 mg total) by mouth every 6 (six) hours as needed.   naproxen sodium 220 MG tablet Commonly known as: ALEVE Take 440 mg by mouth daily as needed (pain).   predniSONE 20 MG  tablet Commonly known as: DELTASONE Take 2 tablets (40 mg total) by mouth daily with breakfast. For 5 days, then take 20 mg daily for 11 days Start taking on: Oct 07, 2020 Replaces: predniSONE 10 MG (21) Tbpk tablet    sulfamethoxazole-trimethoprim 800-160 MG tablet Commonly known as: BACTRIM DS Take 2 tablets by mouth every 8 (eight) hours for 18 days.       Follow-up Information    [facility]. Schedule an appointment as soon as possible for a visit in 1 week(s).   Specialty: Infectious Diseases Contact information: [address] [phone] [place] [phone]              Consultations:  ID  Pulmonary   Procedures/Studies:  Bronchoscopy 5/6  DG Chest 2 View  Result Date: 09/30/2020 CLINICAL DATA:  Productive cough EXAM: CHEST - 2 VIEW COMPARISON:  None. FINDINGS: Diffuse bilateral interstitial thickening and alveolar airspace opacities. No focal consolidation. No pleural effusion or pneumothorax. Heart and mediastinal contours are unremarkable. No acute osseous abnormality. IMPRESSION: Diffuse bilateral interstitial and alveolar airspace opacities which may reflect pulmonary edema versus multilobar pneumonia. Electronically Signed   By: Kathreen Devoid   On: 09/30/2020 15:15   CT CHEST W CONTRAST  Result Date: 10/01/2020 CLINICAL DATA:  Multifocal pneumonia, HIV positive, cough and weakness EXAM: CT CHEST WITH CONTRAST TECHNIQUE: Multidetector CT imaging of the chest was performed during intravenous contrast administration. CONTRAST:  63mL OMNIPAQUE IOHEXOL 300 MG/ML  SOLN COMPARISON:  09/30/2020 FINDINGS: Cardiovascular: The heart and great vessels are unremarkable without pericardial effusion. No evidence of thoracic aortic aneurysm or dissection. Mediastinum/Nodes: No enlarged mediastinal, hilar, or axillary lymph nodes. Thyroid gland, trachea, and esophagus demonstrate no significant findings. Lungs/Pleura: There are scattered bilateral interstitial and ground-glass opacities. The appearance is most consistent with atypical pneumonia in an immunocompromised patient, such as pneumocystis pneumonia. No effusion or pneumothorax. The central  airways are patent. Mild bullous emphysematous changes at the lung apices. Upper Abdomen: No acute abnormality. Musculoskeletal: No acute or destructive bony lesions. Reconstructed images demonstrate no additional findings. IMPRESSION: 1. Scattered bilateral interstitial and ground-glass opacities, compatible with multifocal atypical pneumonia in an immunocompromised patient. 2. Emphysema. Electronically Signed   By: Randa Ngo M.D.   On: 10/01/2020 22:59      Subjective: - no chest pain, shortness of breath, no abdominal pain, nausea or vomiting.   Discharge Exam: BP 104/78 (BP Location: Left Arm)   Pulse 76   Temp 98.2 F (36.8 C)   Resp 20   Ht 5\' 9"  (1.753 m)   Wt 63.5 kg   SpO2 91%   BMI 20.67 kg/m   General: Pt is alert, awake, not in acute distress Cardiovascular: RRR, S1/S2 +, no rubs, no gallops Respiratory: CTA bilaterally, no wheezing, no rhonchi Abdominal: Soft, NT, ND, bowel sounds + Extremities: no edema, no cyanosis    The results of significant diagnostics from this hospitalization (including imaging, microbiology, ancillary and laboratory) are listed below for reference.     Microbiology: Recent Results (from the past 240 hour(s))  Blood culture (routine x 2)     Status: None   Collection Time: 09/30/20  1:34 PM   Specimen: BLOOD  Result Value Ref Range Status   Specimen Description BLOOD SITE NOT SPECIFIED  Final   Special Requests   Final    BOTTLES DRAWN AEROBIC AND ANAEROBIC Blood Culture results may not be optimal due to an inadequate volume of blood  received in culture bottles   Culture   Final    NO GROWTH 5 DAYS Performed at [facility], [place] [address] [place], [place] [address]    Report Status 10/05/2020 FINAL  Final  Blood culture (routine x 2)     Status: None   Collection Time: 09/30/20  6:20 PM   Specimen: BLOOD  Result Value Ref Range Status   Specimen Description BLOOD SITE NOT SPECIFIED  Final   Special Requests   Final     BOTTLES DRAWN AEROBIC AND ANAEROBIC Blood Culture results may not be optimal due to an inadequate volume of blood received in culture bottles   Culture   Final    NO GROWTH 5 DAYS Performed at [facility], [place] [address], [place], [place] [address]    Report Status 10/05/2020 FINAL  Final  Resp Panel by RT-PCR (Flu A&B, Covid) Nasopharyngeal Swab     Status: None   Collection Time: 09/30/20  6:23 PM   Specimen: Nasopharyngeal Swab; Nasopharyngeal(NP) swabs in vial transport medium  Result Value Ref Range Status   SARS Coronavirus 2 by RT PCR NEGATIVE NEGATIVE Final    Comment: (NOTE) SARS-CoV-2 target nucleic acids are NOT DETECTED.  The SARS-CoV-2 RNA is generally detectable in upper respiratory specimens during the acute phase of infection. The lowest concentration of SARS-CoV-2 viral copies this assay can detect is 138 copies/mL. A negative result does not preclude SARS-Cov-2 infection and should not be used as the sole basis for treatment or other patient management decisions. A negative result may occur with  improper specimen collection/handling, submission of specimen other than nasopharyngeal swab, presence of viral mutation(s) within the areas targeted by this assay, and inadequate number of viral copies(<138 copies/mL). A negative result must be combined with clinical observations, patient history, and epidemiological information. The expected result is Negative.  Fact Sheet for Patients:  EntrepreneurPulse.com.au  Fact Sheet for Healthcare Providers:  IncredibleEmployment.be  This test is no t yet approved or cleared by the [place] FDA and  has been authorized for detection and/or diagnosis of SARS-CoV-2 by FDA under an Emergency Use Authorization (EUA). This EUA will remain  in effect (meaning this test can be used) for the duration of the COVID-19 declaration under Section 564(b)(1) of the Act, 21 U.S.C.section  360bbb-3(b)(1), unless the authorization is terminated  or revoked sooner.       Influenza A by PCR NEGATIVE NEGATIVE Final   Influenza B by PCR NEGATIVE NEGATIVE Final    Comment: (NOTE) The Xpert Xpress SARS-CoV-2/FLU/RSV plus assay is intended as an aid in the diagnosis of influenza from Nasopharyngeal swab specimens and should not be used as a sole basis for treatment. Nasal washings and aspirates are unacceptable for Xpert Xpress SARS-CoV-2/FLU/RSV testing.  Fact Sheet for Patients: EntrepreneurPulse.com.au  Fact Sheet for Healthcare Providers: IncredibleEmployment.be  This test is not yet approved or cleared by the [place] FDA and has been authorized for detection and/or diagnosis of SARS-CoV-2 by FDA under an Emergency Use Authorization (EUA). This EUA will remain in effect (meaning this test can be used) for the duration of the COVID-19 declaration under Section 564(b)(1) of the Act, 21 U.S.C. section 360bbb-3(b)(1), unless the authorization is terminated or revoked.  Performed at [facility], [place] [address], [place], [place] [address]   Sputum culture     Status: None   Collection Time: 09/30/20  9:55 PM   Specimen: Sputum  Result Value Ref Range  Status   Specimen Description SPUTUM  Final   Special Requests Immunocompromised  Final   Sputum evaluation   Final    THIS SPECIMEN IS ACCEPTABLE FOR SPUTUM CULTURE Performed at [facility], [address], [place], [place] [address]    Report Status 10/03/2020 FINAL  Final  Culture, Respiratory w Gram Stain     Status: None   Collection Time: 09/30/20  9:55 PM   Specimen: SPU  Result Value Ref Range Status   Specimen Description SPUTUM  Final   Special Requests Immunocompromised Reflexed from [id number]  Final   Gram Stain   Final    NO WBC SEEN RARE SQUAMOUS EPITHELIAL CELLS PRESENT MODERATE YEAST RARE GRAM POSITIVE RODS RARE GRAM NEGATIVE  DIPLOCOCCI Performed at [facility], [place] [address], [place], [place] [address]    Culture FEW CANDIDA ALBICANS  Final   Report Status 10/05/2020 FINAL  Final  Acid Fast Smear (AFB)     Status: None   Collection Time: 10/01/20  8:32 PM   Specimen: Sputum  Result Value Ref Range Status   AFB Specimen Processing Concentration  Final   Acid Fast Smear Negative  Final    Comment: (NOTE) Performed At: [facility] [address] [place], [place] [id number] Rush Farmer MD [id number]    Source (AFB) SPUTUM  Final    Comment: Performed at [facility], [place] [address], [place], [place] [address]  Blastomyces Antigen     Status: None   Collection Time: 10/02/20  7:43 AM   Specimen: Urine  Result Value Ref Range Status   Blastomyces Antigen None Detected None Detected ng/mL Final    Comment: (NOTE) Results reported as ng/mL in 0.2 - 14.7 ng/mL range Results above the limit of detection but below 0.2 ng/mL are reported as 'Positive, Below the Limit of Quantification' Results above 14.7 ng/mL are reported as 'Positive, Above the Limit of Quantification'    Specimen Type SERUM  Final    Comment: (NOTE) Performed At: [place], [place] [id number] Bruce Donath MD [id number]   Acid Fast Smear (AFB)     Status: None   Collection Time: 10/04/20  3:54 AM   Specimen: Sputum  Result Value Ref Range Status   AFB Specimen Processing Concentration  Final   Acid Fast Smear Negative  Final    Comment: (NOTE) Performed At: [facility] [address] [place], [place] [id number] Rush Farmer MD [id number]    Source (AFB) SPUTUM  Final    Comment: Performed at [facility], [place] [address], [place], [place] [address]  Acid Fast Smear (AFB)     Status: None   Collection Time: 10/04/20  3:54 AM   Specimen: Sputum  Result Value Ref Range Status   AFB Specimen Processing Concentration  Final   Acid Fast  Smear Negative  Final    Comment: (NOTE) Performed At: [facility] [place], [place] [id number] Rush Farmer MD [id number]    Source (AFB) SPUTUM  Final    Comment: Performed at [facility], [place] [address], [place], [place] [address]  Culture, Respiratory w Gram Stain     Status: None (Preliminary result)   Collection Time: 10/05/20  1:10 PM   Specimen: Bronchial Alveolar Lavage; Respiratory  Result Value Ref Range Status   Specimen Description BRONCHIAL ALVEOLAR LAVAGE  Final   Special Requests NONE  Final   Gram  Stain   Final    NO WBC SEEN NO ORGANISMS SEEN Performed at [facility], [place] [address] [place], [place] [address]    Culture PENDING  Incomplete   Report Status PENDING  Incomplete     Labs: Basic Metabolic Panel: Recent Labs  Lab 10/01/20 0220 10/02/20 0429 10/03/20 0813 10/05/20 0409 10/06/20 0116  NA 136 137 137 132* 132*  K 4.0 4.0 4.3 4.7 5.4*  CL 105 106 105 100 99  CO2 22 22 23 23 24   GLUCOSE 176* 78 91 101* 115*  BUN 13 10 17  23* 23*  CREATININE 0.77 0.92 0.90 1.04 0.98  CALCIUM 8.2* 8.3* 8.4* 8.9 8.4*   Liver Function Tests: Recent Labs  Lab 10/01/20 0220  AST 18  ALT 8  ALKPHOS 155*  BILITOT 0.4  PROT 7.2  ALBUMIN 2.2*   CBC: Recent Labs  Lab 09/30/20 1427 10/01/20 0220  WBC 6.1 5.1  HGB 13.2 12.0*  HCT 41.6 37.7*  MCV 84.7 84.0  PLT 376 359   CBG: Recent Labs  Lab 10/01/20 0800 10/01/20 1131 10/01/20 1541  GLUCAP 100* 160* 134*   Hgb A1c No results for input(s): HGBA1C in the last 72 hours. Lipid Profile No results for input(s): CHOL, HDL, LDLCALC, TRIG, CHOLHDL, LDLDIRECT in the last 72 hours. Thyroid function studies No results for input(s): TSH, T4TOTAL, T3FREE, THYROIDAB in the last 72 hours.  Invalid input(s): FREET3 Urinalysis    Component Value Date/Time   COLORURINE AMBER (A) 07/06/2013 0453   APPEARANCEUR CLOUDY (A) 07/06/2013 0453   LABSPEC 1.027 07/06/2013  0453   PHURINE 6.0 07/06/2013 0453   GLUCOSEU NEGATIVE 07/06/2013 0453   HGBUR NEGATIVE 07/06/2013 0453   BILIRUBINUR MODERATE (A) 07/06/2013 0453   KETONESUR 15 (A) 07/06/2013 0453   PROTEINUR 100 (A) 07/06/2013 0453   UROBILINOGEN 0.2 07/06/2013 0453   NITRITE NEGATIVE 07/06/2013 0453   LEUKOCYTESUR MODERATE (A) 07/06/2013 0453    FURTHER DISCHARGE INSTRUCTIONS:   Get Medicines reviewed and adjusted: Please take all your medications with you for your next visit with your Primary MD   Laboratory/radiological data: Please request your Primary MD to go over all hospital tests and procedure/radiological results at the follow up, please ask your Primary MD to get all Hospital records sent to his/her office.   In some cases, they will be blood work, cultures and biopsy results pending at the time of your discharge. Please request that your primary care M.D. goes through all the records of your hospital data and follows up on these results.   Also Note the following: If you experience worsening of your admission symptoms, develop shortness of breath, life threatening emergency, suicidal or homicidal thoughts you must seek medical attention immediately by calling 911 or calling your MD immediately  if symptoms less severe.   You must read complete instructions/literature along with all the possible adverse reactions/side effects for all the Medicines you take and that have been prescribed to you. Take any new Medicines after you have completely understood and accpet all the possible adverse reactions/side effects.    Do not drive when taking Pain medications or sleeping medications (Benzodaizepines)   Do not take more than prescribed Pain, Sleep and Anxiety Medications. It is not advisable to combine anxiety,sleep and pain medications without talking with your primary care practitioner   Special Instructions: If you have smoked or chewed Tobacco  in the last 2 yrs please stop smoking, stop  any regular Alcohol  and  or any Recreational drug use.   Wear Seat belts while driving.   Please note: You were cared for by a hospitalist during your hospital stay. Once you are discharged, your primary care physician will handle any further medical issues. Please note that NO REFILLS for any discharge medications will be authorized once you are discharged, as it is imperative that you return to your primary care physician (or establish a relationship with a primary care physician if you do not have one) for your post hospital discharge needs so that they can reassess your need for medications and monitor your lab values.  Time coordinating discharge: 40 minutes  SIGNED:  Marzetta Board, MD, PhD 10/06/2020, 10:32 AM

## 2020-10-06 NOTE — Progress Notes (Signed)
Removed PIV's. Has prescriptions, medications and personal belongings.]Patient verbalized understanding of discharge instructions. Taken to discharge area via wheelchair by tech.

## 2020-10-06 NOTE — TOC Transition Note (Signed)
Transition of Care [facility]) - CM/SW Discharge Note   Patient Details  Name: Steven Johnston MRN: [id number] Date of Birth: 09-Feb-1961  [facility] CM/SW Contact:  Carles Collet, RN Phone Number: 10/06/2020, 11:34 AM   Clinical Narrative:     Steven Johnston w patient at bedside. Explained that we have arranged to have Dr Baxter Flattery bring 30 day supply of Biktarvy to room for DC. He has follow up at ID clinic 5/17. [place] letter provided to patient for other medications. He confirms that he has transportation home.           Patient Goals and CMS Choice        Discharge Placement                       Discharge Plan and Services                                     Social Determinants of Health (SDOH) Interventions     Readmission Risk Interventions No flowsheet data found.

## 2020-10-06 NOTE — Progress Notes (Signed)
    [facility] for Infectious Disease    Date of Admission:  09/30/2020   Total days of antibiotics 7   ID: Steven Johnston is a 60 y.o. male with   Principal Problem:   Bilateral pneumonia Active Problems:   HIV disease ([place])   Acute respiratory failure with hypoxia (HCC)   Bilateral interstitial pneumonia (HCC)   Night sweats   Unspecified severe protein-calorie malnutrition (HCC)    Subjective: Afebrile. Mild productive cough.  ROS: no fevers, diarrhea. Arthralgias, headache nor rash  Medications:  . bictegravir-emtricitabine-tenofovir AF  1 tablet Oral Daily  . enoxaparin (LOVENOX) injection  40 mg Subcutaneous Q24H  . feeding supplement  237 mL Oral TID BM  . predniSONE  40 mg Oral Q breakfast   Followed by  . [START ON 10/11/2020] predniSONE  20 mg Oral Q breakfast  . sulfamethoxazole-trimethoprim  2 tablet Oral Q8H    Objective: Vital signs in last 24 hours: Temp:  [97.9 F (36.6 C)-99 F (37.2 C)] 98.2 F (36.8 C) (05/07 0640) Pulse Rate:  [66-92] 76 (05/07 0640) Resp:  [15-20] 20 (05/07 0640) BP: (104-124)/(68-88) 104/78 (05/07 0640) SpO2:  [90 %-94 %] 91 % (05/07 0640) Physical Exam  Constitutional: He is oriented to person, place, and time. He appears cachetic and under-nourished. No distress.  HENT:  Mouth/Throat: Oropharynx is clear and moist. No oropharyngeal exudate.  Cardiovascular: Normal rate, regular rhythm and normal heart sounds. Exam reveals no gallop and no friction rub.  No murmur heard.  Pulmonary/Chest: Effort normal and breath sounds normal. No respiratory distress. He has no wheezes.  Abdominal: Soft. Bowel sounds are normal. He exhibits no distension. There is no tenderness.  Lymphadenopathy:  He has no cervical adenopathy.  Neurological: He is alert and oriented to person, place, and time.  Skin: Skin is warm and dry. No rash noted. No erythema.  Psychiatric: He has a normal mood and affect. His behavior is normal.     Lab  Results Recent Labs    10/05/20 0409 10/06/20 0116  NA 132* 132*  K 4.7 5.4*  CL 100 99  CO2 23 24  BUN 23* 23*  CREATININE 1.04 0.98    Microbiology: reviewed  Studies/Results: No results found.   Assessment/Plan: HIV disease = continue on biktarvy daily. Gave 28 d supply in hand with follow up in ID clinic. Counseled on adherence. On follow up, he can bring his young daughter with him to answer questions  pjp pneumonia = finish out bactrim for a total of 21 day course plus prednisone taper.  oi proph = after 21 day of treatment wiill need to do daily bactrim ds for oi proph, plus will also need azithromycin 1200mg  weekly. 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 Anticipate to have some weight gain back as he is on treatment for hiv. Continue with supplementation [facility] Cell: [phone] Pager: [phone]  10/06/2020, 12:51 PM

## 2020-10-07 LAB — CULTURE, RESPIRATORY W GRAM STAIN
Culture: NO GROWTH
Gram Stain: NONE SEEN

## 2020-10-08 ENCOUNTER — Encounter (HOSPITAL_COMMUNITY): Payer: Self-pay | Admitting: Critical Care Medicine

## 2020-10-08 LAB — CYTOLOGY - NON PAP

## 2020-10-08 NOTE — Anesthesia Postprocedure Evaluation (Signed)
Anesthesia Post Note  Patient: Steven Johnston  Procedure(s) Performed: VIDEO BRONCHOSCOPY WITHOUT FLUORO (N/A ) BRONCHIAL WASHINGS     Patient location during evaluation: PACU Anesthesia Type: General Level of consciousness: awake and alert Pain management: pain level controlled Vital Signs Assessment: post-procedure vital signs reviewed and stable Respiratory status: spontaneous breathing, nonlabored ventilation and respiratory function stable Cardiovascular status: blood pressure returned to baseline and stable Postop Assessment: no apparent nausea or vomiting Anesthetic complications: no   No complications documented.  Last Vitals:  Vitals:   10/05/20 2139 10/06/20 0640  BP: 123/82 104/78  Pulse: 79 76  Resp: 20 20  Temp: 36.6 C 36.8 C  SpO2: 94% 91%    Last Pain:  Vitals:   10/06/20 0718  TempSrc:   PainSc: 0-No pain                 Merlinda Frederick

## 2020-10-13 ENCOUNTER — Encounter (HOSPITAL_COMMUNITY): Payer: Self-pay

## 2020-10-13 ENCOUNTER — Other Ambulatory Visit: Payer: Self-pay

## 2020-10-13 ENCOUNTER — Emergency Department (HOSPITAL_COMMUNITY)
Admission: EM | Admit: 2020-10-13 | Discharge: 2020-10-13 | Disposition: A | Discharge status: Home / Self Care | Payer: Medicaid Other | Attending: Emergency Medicine | Admitting: Emergency Medicine

## 2020-10-13 ENCOUNTER — Emergency Department (HOSPITAL_COMMUNITY): Payer: Medicaid Other

## 2020-10-13 DIAGNOSIS — R200 Anesthesia of skin: Secondary | ICD-10-CM

## 2020-10-13 DIAGNOSIS — R202 Paresthesia of skin: Secondary | ICD-10-CM | POA: Insufficient documentation

## 2020-10-13 DIAGNOSIS — R1310 Dysphagia, unspecified: Secondary | ICD-10-CM | POA: Insufficient documentation

## 2020-10-13 DIAGNOSIS — B20 Human immunodeficiency virus [HIV] disease: Secondary | ICD-10-CM

## 2020-10-13 DIAGNOSIS — Z21 Asymptomatic human immunodeficiency virus [HIV] infection status: Secondary | ICD-10-CM | POA: Insufficient documentation

## 2020-10-13 DIAGNOSIS — R109 Unspecified abdominal pain: Secondary | ICD-10-CM | POA: Insufficient documentation

## 2020-10-13 DIAGNOSIS — F17200 Nicotine dependence, unspecified, uncomplicated: Secondary | ICD-10-CM | POA: Insufficient documentation

## 2020-10-13 LAB — COMPREHENSIVE METABOLIC PANEL
ALT: 12 U/L (ref 0–44)
AST: 19 U/L (ref 15–41)
Albumin: 2.5 g/dL — ABNORMAL LOW (ref 3.5–5.0)
Alkaline Phosphatase: 517 U/L — ABNORMAL HIGH (ref 38–126)
Anion gap: 7 (ref 5–15)
BUN: 10 mg/dL (ref 6–20)
CO2: 26 mmol/L (ref 22–32)
Calcium: 8.4 mg/dL — ABNORMAL LOW (ref 8.9–10.3)
Chloride: 100 mmol/L (ref 98–111)
Creatinine, Ser: 0.99 mg/dL (ref 0.61–1.24)
GFR, Estimated: >60 mL/min (ref >60)
Glucose, Bld: 80 mg/dL (ref 70–99)
Potassium: 4.1 mmol/L (ref 3.5–5.1)
Sodium: 133 mmol/L — ABNORMAL LOW (ref 135–145)
Total Bilirubin: 0.5 mg/dL (ref 0.3–1.2)
Total Protein: 7.4 g/dL (ref 6.5–8.1)

## 2020-10-13 LAB — CBC
HCT: 38.2 % — ABNORMAL LOW (ref 39.0–52.0)
Hemoglobin: 11.8 g/dL — ABNORMAL LOW (ref 13.0–17.0)
MCH: 26.7 pg (ref 26.0–34.0)
MCHC: 30.9 g/dL (ref 30.0–36.0)
MCV: 86.4 fL (ref 80.0–100.0)
Platelets: 375 10*3/uL (ref 150–400)
RBC: 4.42 MIL/uL (ref 4.22–5.81)
RDW: 14.6 % (ref 11.5–15.5)
WBC: 10.1 10*3/uL (ref 4.0–10.5)
nRBC: 0 % (ref 0.0–0.2)

## 2020-10-13 LAB — LIPASE, BLOOD: Lipase: 30 U/L (ref 11–51)

## 2020-10-13 MED ORDER — ONDANSETRON HCL 4 MG/2ML IJ SOLN
4.0000 mg | Freq: Once | INTRAMUSCULAR | Status: AC
  Administered 2020-10-13: 4 mg via INTRAVENOUS
  Filled 2020-10-13: qty 2

## 2020-10-13 MED ORDER — SODIUM CHLORIDE 0.9 % IV BOLUS
1000.0000 mL | Freq: Once | INTRAVENOUS | Status: AC
  Administered 2020-10-13: 1000 mL via INTRAVENOUS

## 2020-10-13 NOTE — ED Provider Notes (Signed)
[place] EMERGENCY DEPARTMENT Provider Note   CSN: [id number] Arrival date & time: 10/13/20  2014     History Chief Complaint  Patient presents with  . Abdominal Pain    Steven Johnston is a 60 y.o. male.  Pt presents to the ED today with abd pain and difficulty swallowing.  Pt said he feels numb on his face.  He said his sx started 3 days ago.  Pt was just admitted from 5/1-5/7 for multifocal pneumonia with HIV/AIDS.  The pt had a + HIV test in 2019, but was lost to follow up and the pt never knew he had HIV.  He came to the ED on the 1st for sob and weakness.  His CD4 count on admission was 39.  Pt said she was swallowing fine when he was d/c and able to swallow normally.  Pt said he can't feel his lower lip and can't swallow any of his food or pills.  Pt denies any f/c.   Pt does not feel like he has anything stuck in his throat.         Past Medical History:  Diagnosis Date  . Hernia, inguinal, right   . HIV (human immunodeficiency virus infection) [facility])     Patient Active Problem List   Diagnosis Date Noted  . Unspecified severe protein-calorie malnutrition ([place])   . Bilateral interstitial pneumonia ([place])   . Night sweats   . Bilateral pneumonia 09/30/2020  . HIV disease ([place]) 09/30/2020  . Acute respiratory failure with hypoxia ([place]) 09/30/2020  . Incarcerated right inguinal hernia 02/17/2018  . Incarcerated inguinal hernia 02/17/2018    Past Surgical History:  Procedure Laterality Date  . BRONCHIAL WASHINGS  10/05/2020   Procedure: BRONCHIAL WASHINGS;  Surgeon: Julian Hy, DO;  Location: [place];  Service: Endoscopy;;  . INGUINAL HERNIA REPAIR Right 02/17/2018   Procedure: OPEN HERNIA REPAIR RIGHT INGUINAL INCARCERATED WITH DIAGONSTIC LAPAROSCOPY FOR INCARCERATED HERNIA;  Surgeon: Ileana Roup, MD;  Location: [place];  Service: General;  Laterality: Right;  Marland Kitchen VIDEO BRONCHOSCOPY N/A 10/05/2020   Procedure: VIDEO BRONCHOSCOPY  WITHOUT FLUORO;  Surgeon: Julian Hy, DO;  Location: [facility] ENDOSCOPY;  Service: Endoscopy;  Laterality: N/A;       Family History  Problem Relation Age of Onset  . Cancer Brother   . Immunodeficiency Neg Hx     Social History   Tobacco Use  . Smoking status: Current Every Day Smoker    Packs/day: 1.00  . Smokeless tobacco: Never Used  Substance Use Topics  . Alcohol use: Yes  . Drug use: No    Home Medications Prior to Admission medications   Medication Sig Start Date End Date Taking? Authorizing Provider  albuterol (VENTOLIN HFA) 108 (90 Base) MCG/ACT inhaler Inhale 1-2 puffs into the lungs every 6 (six) hours as needed for wheezing or shortness of breath. 10/06/20   Caren Griffins, MD  azithromycin (ZITHROMAX) 600 MG tablet Take 2 tablets (1,200 mg total) by mouth once a week. 10/10/20   Caren Griffins, MD  bictegravir-emtricitabine-tenofovir AF (BIKTARVY) 50-200-25 MG TABS tablet Take 1 tablet by mouth daily. 10/07/20   Caren Griffins, MD  ibuprofen (ADVIL) 600 MG tablet Take 1 tablet (600 mg total) by mouth every 6 (six) hours as needed. Patient not taking: Reported on 09/30/2020 03/08/20   Chase Picket, MD  naproxen sodium (ALEVE) 220 MG tablet Take 440 mg by mouth daily as needed (pain).    [provider]  predniSONE (DELTASONE) 20 MG tablet Take 2 tablets (40 mg total) by mouth daily with breakfast. For 5 days, then take 20 mg daily for 11 days 10/07/20   Caren Griffins, MD  sulfamethoxazole-trimethoprim (BACTRIM DS) 800-160 MG tablet Take 2 tablets by mouth every 8 (eight) hours for 18 days. 10/06/20 10/24/20  Caren Griffins, MD    Allergies    Patient has no known allergies.  Review of Systems   Review of Systems  Neurological: Positive for numbness.       Swallowing difficulty  All other systems reviewed and are negative.   Physical Exam Updated Vital Signs BP (!) 111/51 (BP Location: Right Arm)   Pulse 93   Temp 98.7 F (37.1 C) (Oral)    Resp 17   SpO2 97%   Physical Exam Vitals and nursing note reviewed.  Constitutional:      Appearance: He is underweight.  HENT:     Mouth/Throat:     Mouth: Mucous membranes are dry.  Eyes:     Extraocular Movements: Extraocular movements intact.     Pupils: Pupils are equal, round, and reactive to light.  Cardiovascular:     Rate and Rhythm: Normal rate and regular rhythm.  Pulmonary:     Effort: Pulmonary effort is normal.     Breath sounds: Normal breath sounds.  Abdominal:     General: Abdomen is flat. Bowel sounds are increased.     Palpations: Abdomen is soft.  Skin:    General: Skin is warm.     Capillary Refill: Capillary refill takes less than 2 seconds.  Neurological:     General: No focal deficit present.     Mental Status: He is alert and oriented to person, place, and time.  Psychiatric:        Mood and Affect: Mood normal.        Behavior: Behavior normal.     ED Results / Procedures / Treatments   Labs (all labs ordered are listed, but only abnormal results are displayed) Labs Reviewed  COMPREHENSIVE METABOLIC PANEL - Abnormal; Notable for the following components:      Result Value   Sodium 133 (*)    Calcium 8.4 (*)    Albumin 2.5 (*)    Alkaline Phosphatase 517 (*)    All other components within normal limits  CBC - Abnormal; Notable for the following components:   Hemoglobin 11.8 (*)    HCT 38.2 (*)    All other components within normal limits  URINALYSIS, ROUTINE W REFLEX MICROSCOPIC - Abnormal; Notable for the following components:   Leukocytes,Ua TRACE (*)    Bacteria, UA RARE (*)    All other components within normal limits  LIPASE, BLOOD    EKG EKG Interpretation  Date/Time:  Saturday Oct 13 2020 21:52:43 EDT Ventricular Rate:  85 PR Interval:  156 QRS Duration: 91 QT Interval:  359 QTC Calculation: 427 R Axis:   84 Text Interpretation: Sinus rhythm Probable left atrial enlargement Borderline right axis deviation ST elevation,  consider inferior injury No significant change since last tracing Confirmed by Isla Pence [phone]) on 10/13/2020 10:05:10 PM   Radiology CT Head Wo Contrast  Result Date: 10/13/2020 CLINICAL DATA:  Initial evaluation for neuro deficit, stroke suspected. EXAM: CT HEAD WITHOUT CONTRAST TECHNIQUE: Contiguous axial images were obtained from the base of the skull through the vertex without intravenous contrast. COMPARISON:  Prior CT from 03/12/2010. FINDINGS: Brain: Generalized age-related cerebral atrophy. Patchy  and confluent hypodensity within the periventricular deep white matter both cerebral hemispheres most consistent with chronic small vessel ischemic disease, moderate in nature. Superimposed remote lacunar infarct at the left lentiform nucleus. No acute intracranial hemorrhage. No acute large vessel territory infarct. No mass lesion, midline shift or mass effect. No hydrocephalus or extra-axial fluid collection. Vascular: No hyperdense vessel. Scattered vascular calcifications noted within the carotid siphons. Skull: Scalp soft tissues and calvarium within normal limits. Sinuses/Orbits: Globes orbital soft tissues demonstrate no acute finding. Scattered mucosal thickening within the frontoethmoidal and right maxillary sinuses. Mastoid air cells and middle ear cavities are clear. Other: None. IMPRESSION: 1. No acute intracranial abnormality. 2. Generalized age-related cerebral atrophy with moderate chronic small vessel ischemic disease. Small remote lacunar infarct at the left lentiform nucleus. Electronically Signed   By: Jeannine Boga M.D.   On: 10/13/2020 23:22    Procedures Procedures   Medications Ordered in ED Medications  sodium chloride 0.9 % bolus 1,000 mL (0 mLs Intravenous Stopped 10/13/20 2342)  ondansetron (ZOFRAN) injection 4 mg (4 mg Intravenous Given 10/13/20 2229)    ED Course  I have reviewed the triage vital signs and the nursing notes.  Pertinent labs & imaging  results that were available during my care of the patient were reviewed by me and considered in my medical decision making (see chart for details).    MDM Rules/Calculators/A&P                          It is unclear if pt's swallowing difficulty is from an infectious process vs a central process.  CT head shows nothing acute.  Labs show nothing acute.  No evidence of dehydration.  Pt is able to eat and to swallow without problems for me.  He denies any pain with swallowing.  Numbness and abdominal pain are better.  Pt has an appt on 5/17 with his ID doc.  He is told to keep his appt.  Return if worse.  Final Clinical Impression(s) / ED Diagnoses Final diagnoses:  Swallowing dysfunction  Facial numbness  HIV infection, unspecified symptom status ([place])    Rx / DC Orders ED Discharge Orders    None       Isla Pence, MD 10/14/20 1744

## 2020-10-13 NOTE — ED Triage Notes (Signed)
Pt states he cannot stop spitting and he has had difficulty swallowing. Denies feeling like his throat is closing up. Pt c/o abdominal pain and numbness on face. Denies nausea or vomiting. Symptoms started 3 days ago.

## 2020-10-14 LAB — URINALYSIS, ROUTINE W REFLEX MICROSCOPIC
Bilirubin Urine: NEGATIVE
Glucose, UA: NEGATIVE mg/dL
Hgb urine dipstick: NEGATIVE
Ketones, ur: NEGATIVE mg/dL
Nitrite: NEGATIVE
Protein, ur: NEGATIVE mg/dL
Specific Gravity, Urine: 1.018 (ref 1.005–1.030)
pH: 6 (ref 5.0–8.0)

## 2020-10-16 ENCOUNTER — Other Ambulatory Visit: Payer: Self-pay

## 2020-10-16 ENCOUNTER — Other Ambulatory Visit (HOSPITAL_COMMUNITY): Payer: Self-pay

## 2020-10-16 ENCOUNTER — Telehealth: Payer: Self-pay

## 2020-10-16 ENCOUNTER — Ambulatory Visit (INDEPENDENT_AMBULATORY_CARE_PROVIDER_SITE_OTHER): Payer: Self-pay | Admitting: Infectious Diseases

## 2020-10-16 ENCOUNTER — Encounter: Payer: Self-pay | Admitting: Infectious Diseases

## 2020-10-16 VITALS — BP 117/89 | HR 84 | Temp 98.0°F | Resp 16 | Ht 69.0 in | Wt 124.0 lb

## 2020-10-16 DIAGNOSIS — K029 Dental caries, unspecified: Secondary | ICD-10-CM | POA: Insufficient documentation

## 2020-10-16 DIAGNOSIS — Z113 Encounter for screening for infections with a predominantly sexual mode of transmission: Secondary | ICD-10-CM

## 2020-10-16 DIAGNOSIS — B20 Human immunodeficiency virus [HIV] disease: Secondary | ICD-10-CM

## 2020-10-16 DIAGNOSIS — B59 Pneumocystosis: Secondary | ICD-10-CM

## 2020-10-16 MED ORDER — SULFAMETHOXAZOLE-TRIMETHOPRIM 800-160 MG PO TABS: 1.0000 | ORAL_TABLET | Freq: Every day | ORAL | 3 refills | Status: DC

## 2020-10-16 MED ORDER — BICTEGRAVIR-EMTRICITAB-TENOFOV 50-200-25 MG PO TABS: 1.0000 | ORAL_TABLET | Freq: Every day | ORAL | 3 refills | Status: DC

## 2020-10-16 MED ORDER — AZITHROMYCIN 600 MG PO TABS: 1200.0000 mg | ORAL_TABLET | ORAL | 0 refills | Status: DC

## 2020-10-16 MED ORDER — SULFAMETHOXAZOLE-TRIMETHOPRIM 800-160 MG PO TABS
1.0000 | ORAL_TABLET | Freq: Every day | ORAL | 3 refills | Status: DC
Start: 2020-10-26 — End: 2020-10-16

## 2020-10-16 NOTE — Progress Notes (Addendum)
[facility], [place], [place], [address]                                                                  Phn. [phone]; Fax: [phone]                                                                             Date: 10/16/20  Reason for Visit: HIV/PJP PNA HFU    HPI: Steven Johnston is a 60 y.o.old male who is here for HFU for treatment naive HIV and possible PJP PNA. He was started on Biktarvy as a rapid start in the hospital and was also treated empirically for PJP PNA and improved clinically. His sputum PJP stains have been negative. He also had a bronch and BAL on 5/6 that was negative for organisms in gram stain, routine cx, AFB smear negative, fungal stain negative, AFB cx and fungal cx pending. PJP stain pending. HIV RNA is still pending. funitell more than 500. He was discharged on 5/7 with a fu with ID. However, he came to the ED on 5/14 with abdominal pain, facial numbness and difficulty swallowing. CT head was unremarkable. Numbness and abdominal pain improved in ED and he was discharged from ED.   10/16/20 Says he has been taking Biktarvy 1 tablet a day, Bactrim 2 DS tablets three times a day, Prednisone as instructed. He however says he has not taken meds for 2 days as he feels he hurts at the side of his tongue where his teeth is irritating/cutting. Denies any difficulty or pain swallowing. Denies dental pain. Has multiple missing teeth with dentalcaries. He wants to be seen by a dentist. No exam findings of thrush. I discussed with him to take his medications with apple sauce and not to stop medications. He does not have trouble swallowing, he feels his teeth is irritating his tongue. I discussed with him to make an urgent  Referral to dentist. Denies any fevers, chills, sweats.  Denies any headache, blurry vision, neck pain.   He says his breathing is better and he is able to do household chores without any difficulty. He says he has told his girlfriend about his HIV positive status and she is getting tested as well. Cough is improved. Denies nausea, vomiting, abdominal pain and diarrhea. Facial numbness has improved   I also discussed with him that he needs to be seen by Pharmacy/Financial services for ADAP etc as this is his first visit. However, patient left clinic before we could have his labs drawn/pharamcy or financial services could see him.  HIV diagnosed: 02/18/2018 CD4 at diagnosis: unknown VL at diagnosis:  unknown Recent CD4: 39 (5%) Recent viral load: pending Prior ART: none, treatment naive Current ART: Biktarvy  Hx of OI: PJP PNA Hx of STI: none Risk factors: heterosexual encounter  ROS: Denies dysphagia, odynophagia, cough, fever, nausea, vomiting, diarrhea, constipation, weight loss, chills, night sweats, recent hospitalizations, rashes, joint complaints, shortness of breath, headaches, chest pain, abdominal pain, dysuria .   Current Outpatient Medications on File Prior to Visit  Medication Sig Dispense Refill  . albuterol (VENTOLIN HFA) 108 (90 Base) MCG/ACT inhaler Inhale 1-2 puffs into the lungs every 6 (six) hours as needed for wheezing or shortness of breath. 1 each 1  . predniSONE (DELTASONE) 20 MG tablet Take 2 tablets (40 mg total) by mouth daily with breakfast. For 5 days, then take 20 mg daily for 11 days 21 tablet 0  . sulfamethoxazole-trimethoprim (BACTRIM DS) 800-160 MG tablet Take 2 tablets by mouth every 8 (eight) hours for 18 days. 108 tablet 0  . naproxen sodium (ALEVE) 220 MG tablet Take 440 mg by mouth daily as needed (pain). (Patient not taking: Reported on 10/16/2020)     No current facility-administered medications on file prior to visit.     No Known Allergies  Past Medical History:  Diagnosis Date  . Hernia, inguinal,  right   . HIV (human immunodeficiency virus infection) ([place])    Past Surgical History:  Procedure Laterality Date  . BRONCHIAL WASHINGS  10/05/2020   Procedure: BRONCHIAL WASHINGS;  Surgeon: Julian Hy, DO;  Location: [place];  Service: Endoscopy;;  . INGUINAL HERNIA REPAIR Right 02/17/2018   Procedure: OPEN HERNIA REPAIR RIGHT INGUINAL INCARCERATED WITH DIAGONSTIC LAPAROSCOPY FOR INCARCERATED HERNIA;  Surgeon: Ileana Roup, MD;  Location: [place];  Service: General;  Laterality: Right;  Marland Kitchen VIDEO BRONCHOSCOPY N/A 10/05/2020   Procedure: VIDEO BRONCHOSCOPY WITHOUT FLUORO;  Surgeon: Julian Hy, DO;  Location: [facility] ENDOSCOPY;  Service: Endoscopy;  Laterality: N/A;   Social History   Socioeconomic History  . Marital status: Single    Spouse name: Not on file  . Number of children: Not on file  . Years of education: Not on file  . Highest education level: Not on file  Occupational History  . Not on file  Tobacco Use  . Smoking status: Current Every Day Smoker    Packs/day: 1.00  . Smokeless tobacco: Never Used  Substance and Sexual Activity  . Alcohol use: Yes  . Drug use: No  . Sexual activity: Not on file  Other Topics Concern  . Not on file  Social History Narrative   ** Merged History Encounter **       Social Determinants of Health   Financial Resource Strain: Not on file  Food Insecurity: Not on file  Transportation Needs: Not on file  Physical Activity: Not on file  Stress: Not on file  Social Connections: Not on file  Intimate Partner Violence: Not on file   Vitals  BP 117/89   Pulse 84   Temp 98 F (36.7 C)   Resp 16   Ht _0  (1.753 m)   Wt 124 lb (56.2 kg)   SpO2 94%   BMI 18.31 kg/m    Examination  Gen: Alert and oriented x 3, no acute distress, MALNOURISHED HEENT: [place]/AT, PERL, EOMI, no scleral icterus, no pale conjunctivae, hearing normal, oral mucosa moist, MISSING TEETH, DENTAL CARIES. NO ORAL THRUSH Neck: Supple, no  lymphadenopathy Cardio: Regular rate and rhythm; +S1 and S2; no murmurs, gallops, or rubs Resp:  CTAB; no wheezes, rhonchi, or rales GI: Soft, nontender, nondistended, bowel sounds present GU: Musc: Extremities: No cyanosis, clubbing, or edema; +2 PT and DP pulses Skin: No rashes, lesions, or ecchymoses Neuro: No focal deficits Psych: Calm, cooperative    Lab Results CD4 T Cell Abs (/uL)  Date Value  09/30/2020 39 (L)   No results found for: HIV1GENOSEQ Lab Results  Component Value Date   WBC 10.1 10/13/2020   HGB 11.8 (L) 10/13/2020   HCT 38.2 (L) 10/13/2020   MCV 86.4 10/13/2020   PLT 375 10/13/2020    Lab Results  Component Value Date   CREATININE 0.99 10/13/2020   BUN 10 10/13/2020   NA 133 (L) 10/13/2020   K 4.1 10/13/2020   CL 100 10/13/2020   CO2 26 10/13/2020   Lab Results  Component Value Date   ALT 12 10/13/2020   AST 19 10/13/2020   ALKPHOS 517 (H) 10/13/2020   BILITOT 0.5 10/13/2020    No results found for: CHOL, TRIG, HDL, LDLCALC No results found for: HAV Lab Results  Component Value Date   HEPBSAG NON REACTIVE 10/02/2020   Lab Results  Component Value Date   HCVAB NON REACTIVE 10/02/2020   No results found for: CHLAMYDIAWP, N No results found for: GCPROBEAPT No results found for: [place] Maintenance: Immunization History  Administered Date(s) Administered  . PPD Test 09/30/2020    Assessment/Plan: Problem List Items Addressed This Visit      Respiratory   Bilateral pneumonia - Primary   Relevant Medications   bictegravir-emtricitabine-tenofovir AF (BIKTARVY) 50-200-25 MG TABS tablet   azithromycin (ZITHROMAX) 600 MG tablet   Other Relevant Orders   HIV-1 RNA ultraquant reflex to gentyp+   RPR   Urine cytology ancillary only     Digestive   Dental caries   Relevant Medications   bictegravir-emtricitabine-tenofovir AF (BIKTARVY) 50-200-25 MG TABS tablet   azithromycin (ZITHROMAX) 600 MG tablet     Other   HIV  disease (HCC)   Relevant Medications   bictegravir-emtricitabine-tenofovir AF (BIKTARVY) 50-200-25 MG TABS tablet   azithromycin (ZITHROMAX) 600 MG tablet   Screening for STDs (sexually transmitted diseases)      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 Discussed with patient regarding benefits of treatment, long term outcomes.  Discussed the severity of untreated HIV including higher cancer risk, opportunistic infections, renal failure.  Discussed needing to use condoms, partner disclosure, necessary vaccines, blood monitoring.     Continue Biktarvy PO daily Fu in 2 weeks, needs pending HIV intake labs. RN will get in touch with patient.  Possible PJP PNA Finish out remaining tx course of Prednisone and Bactrim as pescribed Will plan to start secondary PJP ppx with Bactrim after completion of PJP PNA tx course  Opportunistic Infection Azithromycin 1212m PO weekly  Dental caries/Tongue irritation Urgent referral to Dental - he left before the visit was completed  No thrush on exam  STD Screening  Urine GC and RPR  Immunization Will discuss in next visit   Note - Patient left the clinic prior to completion of visit. Will reach out to patient for follow up appointment in 2 weeks  I have personally spent 30 minutes involved in face-to-face and non-face-to-face activities for this patient on the day of the visit.  Electronically signed by:  SRosiland Oz MD Infectious Disease Physician CSt. Catherine Of S[facility] Centerfor [facility] Disease [address] [place] [id number]: [phone] Fax: [phone]

## 2020-10-16 NOTE — Telephone Encounter (Signed)
Please continue to try to reach new B20 patient who walked out today as I could not leave secure vm. He needs follow up in 2-3 weeks with Dr West Bali and will do labs same day, needs dental application, and appt with financial. Remind  Patient that antibiotics for dental issues (Azythromiacin and Sulfa) as well as Biktarvy were sent to [facility] on [address] as Butch Penny said that is the pharmacy he needs to use.

## 2020-10-16 NOTE — Telephone Encounter (Signed)
Patient left during new B20 visit on 10/16/20 with Manandahar.He was written abx for severe dental infection causing him severe weight loss from not eating or drinking or taking meds due to issues swallowing.Front desk did not see him leave. Needs labs, dental application, and to see financial. His antibiotics for infection as well as Biktarvy have been sent to [facility] on [address]. I attempted to call but no way to leave vm as it is not patient on outgoing message.

## 2020-10-19 NOTE — Telephone Encounter (Signed)
I attempted to call patient and patient was unavailable . I left message with family member for patient to give his doctor's office a call back.

## 2020-10-22 ENCOUNTER — Telehealth: Payer: Self-pay

## 2020-10-22 LAB — HIV GENOSURE(R) MG

## 2020-10-22 LAB — HIV-1 RNA, PCR (GRAPH) RFX/GENO EDI
HIV-1 RNA BY PCR: 290000 copies/mL
HIV-1 RNA BY PCR: 345000 copies/mL
HIV-1 RNA Quant, Log: 5.462 log10copy/mL
HIV-1 RNA Quant, Log: 5.538 log10copy/mL

## 2020-10-22 LAB — REFLEX TO GENOSURE(R) MG EDI
HIV GenoSure(R): 1
HIV GenoSure(R): 1

## 2020-10-22 NOTE — Telephone Encounter (Signed)
Patient returned call, call discussed with front desk staff regarding why clinic staff was trying to reach patient.  Patient accepted appointments for labs and will follow up with Dr. West Bali in 2-3 weeks. Patient also made aware of medication ready for pick up at the pharmacy.  Steven Johnston

## 2020-11-05 LAB — FUNGAL ORGANISM REFLEX

## 2020-11-05 LAB — FUNGUS CULTURE RESULT

## 2020-11-05 LAB — FUNGUS CULTURE WITH STAIN

## 2020-11-07 ENCOUNTER — Ambulatory Visit: Payer: Self-pay | Admitting: Infectious Diseases

## 2020-11-07 ENCOUNTER — Encounter: Payer: Self-pay | Admitting: Infectious Diseases

## 2020-11-07 NOTE — Progress Notes (Deleted)
[facility], [place], [place], [address]                                                                  Phn. 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 He was started on Biktarvy as a rapid start in the hospital and was also treated empirically for PJP PNA and improved clinically. His sputum PJP stains have been negative. He also had a bronch and BAL on 5/6 that was negative for organisms in gram stain, routine cx, AFB smear negative, fungal stain negative, AFB cx and fungal cx pending. PJP stain pending. HIV RNA is still pending. funitell more than 500. He was discharged on 5/7 with a fu with ID. However, he came to the ED on 5/14 with abdominal pain, facial numbness and difficulty swallowing. CT head was unremarkable. Numbness and abdominal pain improved in ED and he was discharged from ED.   10/16/20 Says he has been taking Biktarvy 1 tablet a day, Bactrim 2 DS tablets three times a day, Prednisone as instructed. He however says he has not taken meds for 2 days as he feels he hurts at the side of his tongue where his teeth is irritating/cutting. Denies any difficulty or pain swallowing. Denies dental pain. Has multiple missing teeth with dentalcaries. He wants to be seen by a dentist. No exam findings of thrush. I discussed with him to take his medications with apple sauce and not to stop medications. He does not have trouble swallowing, he feels his teeth is irritating his tongue. I discussed with him to make an urgent  Referral to dentist. Denies any fevers, chills, sweats.  Denies any headache, blurry vision, neck pain.   He says his breathing is better and he is able to do household chores without any difficulty. He says he has told his girlfriend about his HIV positive status and she is getting tested as well. Cough is improved. Denies nausea, vomiting, abdominal pain and diarrhea. Facial numbness has improved   I also discussed with him that he needs to be seen by Pharmacy/Financial services for ADAP etc as this is his first visit. However, patient left clinic before we could have his labs drawn/pharamcy or financial services could see him.  HIV diagnosed: 02/18/2018 CD4 at diagnosis: unknown VL at diagnosis:  unknown Recent CD4: 39 (5%) Recent viral load: pending Prior ART: none, treatment naive Current ART: Biktarvy  Hx of OI: PJP PNA Hx of STI: none Risk factors: heterosexual encounter  ROS: Denies dysphagia, odynophagia, cough, fever, nausea, vomiting, diarrhea, constipation, weight loss, chills, night sweats, recent hospitalizations, rashes, joint complaints, shortness of breath, headaches, chest pain, abdominal pain, dysuria .   Current Outpatient Medications on File Prior to Visit  Medication Sig Dispense Refill  . albuterol (VENTOLIN HFA) 108 (90 Base) MCG/ACT inhaler Inhale 1-2 puffs into the lungs every 6 (six) hours as needed for wheezing or shortness of breath. 1 each 1  . azithromycin (ZITHROMAX) 600 MG tablet Take 2 tablets (1,200 mg total) by mouth once a week. 10 tablet 0  . bictegravir-emtricitabine-tenofovir AF (BIKTARVY) 50-200-25 MG TABS tablet Take 1 tablet by mouth daily. 30 tablet 3  . naproxen sodium (ALEVE) 220 MG tablet Take 440 mg by mouth daily as needed (pain). (Patient not taking: Reported on 10/16/2020)    . predniSONE (DELTASONE) 20 MG tablet Take 2 tablets (40 mg total) by mouth daily with breakfast. For 5 days, then take 20 mg daily for 11 days 21 tablet 0  . sulfamethoxazole-trimethoprim (BACTRIM DS) 800-160 MG tablet Take 1  tablet by mouth daily. 30 tablet 3   No current facility-administered medications on file prior to visit.     No Known Allergies  Past Medical History:  Diagnosis Date  . Hernia, inguinal, right   . HIV (human immunodeficiency virus infection) ([place])    Past Surgical History:  Procedure Laterality Date  . BRONCHIAL WASHINGS  10/05/2020   Procedure: BRONCHIAL WASHINGS;  Surgeon: Julian Hy, DO;  Location: [facility];  Service: Endoscopy;;  . INGUINAL HERNIA REPAIR Right 02/17/2018   Procedure: OPEN HERNIA REPAIR RIGHT INGUINAL INCARCERATED WITH DIAGONSTIC LAPAROSCOPY FOR INCARCERATED HERNIA;  Surgeon: Ileana Roup, MD;  Location: [facility];  Service: General;  Laterality: Right;  [place] VIDEO BRONCHOSCOPY N/A 10/05/2020   Procedure: VIDEO BRONCHOSCOPY WITHOUT FLUORO;  Surgeon: Julian Hy, DO;  Location: [facility]COPY;  Service: Endoscopy;  Laterality: N/A;   Social History   Socioeconomic History  . Marital status: Single    Spouse name: Not on file  . Number of children: Not on file  . Years of education: Not on file  . Highest education level: Not on file  Occupational History  . Not on file  Tobacco Use  . Smoking status: Current Every Day Smoker    Packs/day: 1.00  . Smokeless tobacco: Never Used  Substance and Sexual Activity  . Alcohol use: Yes  . Drug use: No  . Sexual activity: Not on file  Other Topics Concern  . Not on file  Social History Narrative   ** Merged History Encounter **       Social Determinants of Health   Financial Resource Strain: Not on file  Food Insecurity: Not on file  Transportation Needs: Not on file  Physical Activity: Not on file  Stress: Not on file  Social Connections: Not on file  Intimate Partner Violence: Not on file   Vitals  There were no vitals taken for this visit.   Examination  Gen: Alert and oriented x 3, no acute distress, MALNOURISHED HEENT: /AT, PERL, EOMI, no scleral icterus, no pale conjunctivae,  hearing normal, oral mucosa moist, MISSING TEETH, DENTAL CARIES. NO ORAL THRUSH Neck: Supple, no lymphadenopathy Cardio: Regular rate and rhythm; +S1 and S2; no murmurs, gallops, or rubs Resp:  CTAB; no wheezes, rhonchi, or rales GI: Soft, nontender, nondistended, bowel sounds present GU: Musc: Extremities: No cyanosis, clubbing, or edema; +2 PT and DP pulses Skin: No rashes, lesions, or ecchymoses Neuro: No focal deficits Psych: Calm, cooperative    Lab Results CD4 T Cell Abs (/uL)  Date Value  09/30/2020 39 (L)   No results found for: HIV1GENOSEQ Lab Results  Component Value Date   WBC 10.1 10/13/2020   HGB 11.8 (L) 10/13/2020   HCT 38.2 (L) 10/13/2020   MCV 86.4 10/13/2020   PLT 375 10/13/2020    Lab Results  Component Value Date   CREATININE 0.99 10/13/2020   BUN 10 10/13/2020   NA 133 (L) 10/13/2020   K 4.1 10/13/2020   CL 100 10/13/2020   CO2 26 10/13/2020   Lab Results  Component Value Date   ALT 12 10/13/2020   AST 19 10/13/2020   ALKPHOS 517 (H) 10/13/2020   BILITOT 0.5 10/13/2020    No results found for: CHOL, TRIG, HDL, LDLCALC No results found for: HAV Lab Results  Component Value Date   HEPBSAG NON REACTIVE 10/02/2020   Lab Results  Component Value Date   HCVAB NON REACTIVE 10/02/2020   No results found for: CHLAMYDIAWP, N No results found for: GCPROBEAPT No results found for: [place] Maintenance: Immunization History  Administered Date(s) Administered  . 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  Discussed the severity of untreated HIV including higher cancer risk, opportunistic infections, renal failure.  Discussed needing to use condoms, partner disclosure, necessary vaccines, blood monitoring.     Continue Biktarvy PO daily Fu in 2 weeks, needs pending HIV  intake labs. RN will get in touch with patient.  Possible PJP PNA Finish out remaining tx course of Prednisone and Bactrim as pescribed Will plan to start secondary PJP ppx with Bactrim after completion of PJP PNA tx course  Opportunistic Infection Azithromycin 1251m PO weekly  Dental caries/Tongue irritation Urgent referral to Dental - he left before the visit was completed  No thrush on exam  STD Screening  Urine GC and RPR  Immunization Will discuss in next visit   Note - Patient left the clinic prior to completion of visit. Will reach out to patient for follow up appointment in 2 weeks  I have personally spent 30 minutes involved in face-to-face and non-face-to-face activities for this patient on the day of the visit.  Electronically signed by:  SRosiland Oz MD Infectious Disease Physician [facility] [address] [place] [address]Phone: [phone] Fax: [phone]

## 2020-11-14 LAB — ACID FAST CULTURE WITH REFLEXED SENSITIVITIES (MYCOBACTERIA): Acid Fast Culture: NEGATIVE

## 2020-11-16 LAB — ACID FAST CULTURE WITH REFLEXED SENSITIVITIES (MYCOBACTERIA): Acid Fast Culture: NEGATIVE

## 2020-11-20 LAB — ACID FAST CULTURE WITH REFLEXED SENSITIVITIES (MYCOBACTERIA): Acid Fast Culture: NEGATIVE

## 2020-12-11 LAB — ACID FAST CULTURE WITH REFLEXED SENSITIVITIES (MYCOBACTERIA): Acid Fast Culture: NEGATIVE

## 2020-12-17 ENCOUNTER — Telehealth: Payer: Self-pay

## 2020-12-17 NOTE — Telephone Encounter (Signed)
Patient called requesting refill of his antibiotic as he says he just took the last one. He says he has his Biktarvy.   He thinks it is the Bactrim that he needs. Advised him that refills are on file and gave him number for [facility].   Scheduled patient for missed follow up appointment.   Beryle Flock, RN

## 2021-01-21 ENCOUNTER — Other Ambulatory Visit: Payer: Self-pay

## 2021-01-21 ENCOUNTER — Ambulatory Visit (INDEPENDENT_AMBULATORY_CARE_PROVIDER_SITE_OTHER): Payer: Self-pay | Admitting: Infectious Diseases

## 2021-01-21 ENCOUNTER — Other Ambulatory Visit (HOSPITAL_COMMUNITY): Payer: Self-pay

## 2021-01-21 VITALS — BP 115/76 | HR 81 | Resp 16 | Ht 69.0 in | Wt 134.3 lb

## 2021-01-21 DIAGNOSIS — Z7185 Encounter for immunization safety counseling: Secondary | ICD-10-CM

## 2021-01-21 DIAGNOSIS — Z113 Encounter for screening for infections with a predominantly sexual mode of transmission: Secondary | ICD-10-CM

## 2021-01-21 DIAGNOSIS — Z7689 Persons encountering health services in other specified circumstances: Secondary | ICD-10-CM

## 2021-01-21 DIAGNOSIS — B20 Human immunodeficiency virus [HIV] disease: Secondary | ICD-10-CM

## 2021-01-21 MED ORDER — AZITHROMYCIN 600 MG PO TABS
1200.0000 mg | ORAL_TABLET | ORAL | 0 refills | Status: DC
Start: 2021-01-21 — End: 2021-05-03

## 2021-01-21 MED ORDER — BICTEGRAVIR-EMTRICITAB-TENOFOV 50-200-25 MG PO TABS: 1.0000 | ORAL_TABLET | Freq: Every day | ORAL | 3 refills | Status: DC

## 2021-01-21 MED ORDER — SULFAMETHOXAZOLE-TRIMETHOPRIM 800-160 MG PO TABS: 1.0000 | ORAL_TABLET | Freq: Every day | ORAL | 3 refills | Status: DC

## 2021-01-21 NOTE — Progress Notes (Signed)
[facility], [place], [place], [address]                                                                  Phn. [phone]; Fax: [phone]                                                                             Date: 01/21/21  Reason for Visit: HIV/PJP PNA HFU    HPI: Steven Johnston is a 60 y.o.old male who is here for HFU for treatment naive HIV and possible PJP PNA s/p treatment course with Bactrim and prednisone who is here for a follow up.   01/21/21 Taking Biktarvy 1 tablet once a day and Bactrim 1 tablet once a day.  Tells me he has completed his previously recommended courses of Bactrim as well as prednisone for treatment of PJP pneumonia.  He denies any barriers to adherence of treatment, denies any side effects to his current medications. However, he has ran out of azithromycin once weekly regimen.  He is willing to see dentist and also PCP to establish care and to undergo age-based screening.  He says he has been vaccinated with [employer] last year.  Does not have a COVID card, he said he will send a picture of COVID card to [place] via MyChart He says he has gained 10 lbs since his last visit in May 2022.   He is currently living with his mother.  His previous girlfriend has been tested for HIV and states she was negative.  Denies being sexually active.  Smokes 4 cigarettes a day and is trying to cut down on it, denies alcohol and IVDU.  He is working part-time mostly in [employer].   HIV h/o  HIV diagnosed: 02/18/2018 CD4 at diagnosis: unknown VL at diagnosis: unknown Recent CD4: 59 (5%) Recent viral load: pending Prior ART: none, treatment naive Current ART: Biktarvy  Hx of OI: PJP PNA Hx of STI: none Risk factors: heterosexual encounter  ROS: Denies dysphagia, odynophagia, cough,  fever, nausea, vomiting, diarrhea, constipation, weight loss, chills, night sweats, recent hospitalizations, rashes, joint complaints, shortness of breath, headaches, chest pain, abdominal pain, dysuria .  Current Outpatient Medications on File Prior to Visit  Medication Sig Dispense Refill   albuterol (VENTOLIN HFA) 108 (90 Base) MCG/ACT inhaler Inhale 1-2 puffs into the lungs every 6 (six) hours as needed for wheezing or shortness of breath. 1 each 1   No current facility-administered medications on file prior to visit.    No Known Allergies  Past Medical History:  Diagnosis Date   Hernia, inguinal, right    HIV (human immunodeficiency  virus infection) [facility])    Past Surgical History:  Procedure Laterality Date   BRONCHIAL WASHINGS  10/05/2020   Procedure: BRONCHIAL WASHINGS;  Surgeon: Julian Hy, DO;  Location: [facility];  Service: Endoscopy;;   INGUINAL HERNIA REPAIR Right 02/17/2018   Procedure: OPEN HERNIA REPAIR RIGHT INGUINAL INCARCERATED WITH DIAGONSTIC LAPAROSCOPY FOR INCARCERATED HERNIA;  Surgeon: Ileana Roup, MD;  Location: [facility];  Service: General;  Laterality: Right;   VIDEO BRONCHOSCOPY N/A 10/05/2020   Procedure: VIDEO BRONCHOSCOPY WITHOUT FLUORO;  Surgeon: Julian Hy, DO;  Location: [facility];  Service: Endoscopy;  Laterality: N/A;   Social History   Socioeconomic History   Marital status: Single    Spouse name: Not on file   Number of children: Not on file   Years of education: Not on file   Highest education level: Not on file  Occupational History   Not on file  Tobacco Use   Smoking status: Every Day    Packs/day: 1.00    Types: Cigarettes   Smokeless tobacco: Never  Substance and Sexual Activity   Alcohol use: Yes   Drug use: No   Sexual activity: Not on file  Other Topics Concern   Not on file  Social History Narrative   ** Merged History Encounter **       Social Determinants of Health   Financial Resource Strain: Not on  file  Food Insecurity: Not on file  Transportation Needs: Not on file  Physical Activity: Not on file  Stress: Not on file  Social Connections: Not on file  Intimate Partner Violence: Not on file   Vitals  BP 115/76   Pulse 81   Resp 16   Ht 5' 9"  (1.753 m)   Wt 134 lb 4.8 oz (60.9 kg)   SpO2 98%   BMI 19.83 kg/m   Examination  Gen: Alert and oriented x 3, no acute distress, HEENT: [place]/AT, PERL, no scleral icterus, no pale conjunctivae, hearing normal, oral mucosa moist, MISSING TEETH, DENTAL CARIES. NO ORAL THRUSH Neck: Supple, no lymphadenopathy Cardio: Regular rate and rhythm; +S1 and S2; no murmurs, gallops, or rubs Resp: CTAB; no wheezes, rhonchi, or rales GI: Soft, nontender, nondistended, bowel sounds present GU: Musc: Extremities: No cyanosis, clubbing, or edema; +2 PT and DP pulses Skin: No rashes, lesions, or ecchymoses Neuro: No focal deficits Psych: Calm, cooperative    Lab Results CD4 T Cell Abs (/uL)  Date Value  09/30/2020 39 (L)   No results found for: HIV1GENOSEQ Lab Results  Component Value Date   WBC 10.1 10/13/2020   HGB 11.8 (L) 10/13/2020   HCT 38.2 (L) 10/13/2020   MCV 86.4 10/13/2020   PLT 375 10/13/2020    Lab Results  Component Value Date   CREATININE 0.99 10/13/2020   BUN 10 10/13/2020   NA 133 (L) 10/13/2020   K 4.1 10/13/2020   CL 100 10/13/2020   CO2 26 10/13/2020   Lab Results  Component Value Date   ALT 12 10/13/2020   AST 19 10/13/2020   ALKPHOS 517 (H) 10/13/2020   BILITOT 0.5 10/13/2020    No results found for: CHOL, TRIG, HDL, LDLCALC No results found for: HAV Lab Results  Component Value Date   HEPBSAG NON REACTIVE 10/02/2020   Lab Results  Component Value Date   HCVAB NON REACTIVE 10/02/2020   No results found for: CHLAMYDIAWP, N No results found for: GCPROBEAPT No results found for: [place] Maintenance: Immunization  History  Administered Date(s) Administered   PPD Test 09/30/2020    Assessment/Plan  Problem List Items Addressed This Visit       Other   HIV disease ([place]) - Primary   Relevant Medications   sulfamethoxazole-trimethoprim (BACTRIM DS) 800-160 MG tablet   bictegravir-emtricitabine-tenofovir AF (BIKTARVY) 50-200-25 MG TABS tablet   azithromycin (ZITHROMAX) 600 MG tablet   Other Relevant Orders   HIV-1 RNA ultraquant reflex to gentyp+   T-helper cell (CD4)- ([facility] clinic only)   AMB REFERRAL TO [facility]   Ambulatory referral to Internal Medicine   Lipid panel   HgB A1c   Hepatitis A antibody, total   HLA B*[id number]   Screening for STDs (sexually transmitted diseases)   Relevant Orders   RPR   Urine cytology ancillary only   Immunization counseling   Encounter to establish care with new doctor   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    Continue Biktarvy PO daily Continue Bactrim for PJP ppx Meet with Finance Referral to [place] in 2 months HIV RNA and CD4   Possible PJP PNA S/p tx course of Prednisone and Bactrim  Continue secondary PJP ppx with Bactrim  Opportunistic Infection Azithromycin 1218m PO weekly refilled until HIV RNA is well controlled for now  STD Screening  Urine GC and RPR  Immunization He wants to discuss about immunization next visit  He will send u[place]ore[place]icture of his covid vaccination card via mychart   Health Maintenance Lipid panel, a1c Dental referral placed Referral to IM for establish  PCP and age based screening   I have personally spent 30 minutes involved in face-to-face and non-face-to-face activities for this patient on the day of the visit.  Electronically signed by:  SRosiland Oz MD Infectious Disease Physician [facility] [address] [place] [address]Phone: [phone] Fax: [phone]

## 2021-01-22 LAB — T-HELPER CELL (CD4) - (RCID CLINIC ONLY)
CD4 % Helper T Cell: 14 % — ABNORMAL LOW (ref 33–65)
CD4 T Cell Abs: 165 /uL — ABNORMAL LOW (ref 400–1790)

## 2021-01-22 LAB — URINE CYTOLOGY ANCILLARY ONLY
Chlamydia: NEGATIVE
Comment: NEGATIVE
Comment: NORMAL
Neisseria Gonorrhea: NEGATIVE

## 2021-01-26 LAB — HEPATITIS A ANTIBODY, TOTAL: Hepatitis A AB,Total: NONREACTIVE

## 2021-01-26 LAB — HEMOGLOBIN A1C
Hgb A1c MFr Bld: 5.5 % of total Hgb (ref <5.7)
Mean Plasma Glucose: 111 mg/dL
eAG (mmol/L): 6.2 mmol/L

## 2021-01-26 LAB — LIPID PANEL
Cholesterol: 192 mg/dL (ref <200)
HDL: 45 mg/dL (ref >=40)
LDL Cholesterol (Calc): 125 mg/dL (calc) — ABNORMAL HIGH
Non-HDL Cholesterol (Calc): 147 mg/dL (calc) — ABNORMAL HIGH (ref <130)
Total CHOL/HDL Ratio: 4.3 (calc) (ref <5.0)
Triglycerides: 113 mg/dL (ref <150)

## 2021-01-26 LAB — HLA B*5701: HLA-B*5701 w/rflx HLA-B High: NEGATIVE

## 2021-01-26 LAB — RPR: RPR Ser Ql: NONREACTIVE

## 2021-01-26 LAB — HIV-1 RNA ULTRAQUANT REFLEX TO GENTYP+
HIV 1 RNA Quant: 144 copies/mL — ABNORMAL HIGH
HIV-1 RNA Quant, Log: 2.16 Log copies/mL — ABNORMAL HIGH

## 2021-01-28 ENCOUNTER — Telehealth: Payer: Self-pay

## 2021-01-28 NOTE — Telephone Encounter (Signed)
Called patient to relay results, no answer and name on voicemail inbox did not match patient's name. Did not leave voicemail.   Beryle Flock, RN

## 2021-01-28 NOTE — Telephone Encounter (Signed)
-----   Message from Rosiland Oz, MD sent at 01/27/2021  4:18 PM EDT ----- Please let him know that his viral load has significantly dropped from over 5000 to less than 200 copies.

## 2021-01-29 NOTE — Telephone Encounter (Signed)
Called patient, his mother answered. Asked that she please have the patient call his doctor's office when he gets a chance. Did not disclose any personal or health information.   Beryle Flock, RN

## 2021-01-30 NOTE — Telephone Encounter (Signed)
Patient advised of VL results and verbalized understanding. Patient had no questions

## 2021-01-30 NOTE — Telephone Encounter (Signed)
Patient called and left a voicemail for or office to call back at [phone]. I attempted to contact patient and patient did not answer and no secured voicemail setup. [place] Ad[place] T Brooks Sailors

## 2021-02-05 ENCOUNTER — Encounter: Payer: Self-pay | Admitting: Infectious Diseases

## 2021-03-26 ENCOUNTER — Ambulatory Visit: Payer: Self-pay | Admitting: Infectious Diseases

## 2021-05-03 ENCOUNTER — Encounter: Payer: Self-pay | Admitting: Infectious Diseases

## 2021-05-03 ENCOUNTER — Ambulatory Visit (INDEPENDENT_AMBULATORY_CARE_PROVIDER_SITE_OTHER): Payer: Self-pay | Admitting: Infectious Diseases

## 2021-05-03 ENCOUNTER — Other Ambulatory Visit: Payer: Self-pay

## 2021-05-03 VITALS — BP 118/78 | HR 94 | Resp 16 | Ht 69.0 in | Wt 152.0 lb

## 2021-05-03 DIAGNOSIS — E43 Unspecified severe protein-calorie malnutrition: Secondary | ICD-10-CM | POA: Insufficient documentation

## 2021-05-03 DIAGNOSIS — F17200 Nicotine dependence, unspecified, uncomplicated: Secondary | ICD-10-CM

## 2021-05-03 DIAGNOSIS — Z720 Tobacco use: Secondary | ICD-10-CM | POA: Insufficient documentation

## 2021-05-03 DIAGNOSIS — IMO0001 Reserved for inherently not codable concepts without codable children: Secondary | ICD-10-CM

## 2021-05-03 DIAGNOSIS — Z113 Encounter for screening for infections with a predominantly sexual mode of transmission: Secondary | ICD-10-CM

## 2021-05-03 DIAGNOSIS — B20 Human immunodeficiency virus [HIV] disease: Secondary | ICD-10-CM

## 2021-05-03 DIAGNOSIS — Z23 Encounter for immunization: Secondary | ICD-10-CM

## 2021-05-03 DIAGNOSIS — E46 Unspecified protein-calorie malnutrition: Secondary | ICD-10-CM

## 2021-05-03 MED ORDER — BICTEGRAVIR-EMTRICITAB-TENOFOV 50-200-25 MG PO TABS: 1.0000 | ORAL_TABLET | Freq: Every day | ORAL | 3 refills | Status: DC

## 2021-05-03 MED ORDER — SULFAMETHOXAZOLE-TRIMETHOPRIM 800-160 MG PO TABS
1.0000 | ORAL_TABLET | Freq: Every day | ORAL | 3 refills | Status: DC
Start: 2021-05-03 — End: 2021-09-10

## 2021-05-03 NOTE — Progress Notes (Addendum)
[facility], [place], [place], [address]                                                                  Phn. [phone]; Fax: [phone]                                                                             Date: 05/03/21  Reason for Visit: HIV follow up    HPI: Steven Johnston is a 60 y.o.old male with a PMH of HIV and possible PJP PNA s/p treatment course with Bactrim and prednisone who is here for a follow up.  05/03/21 Previously missed appointments, told his mother was sick and in the hospital for missed appts. Tells he is compliant with Biktarvy and bactrim and denies missing doses. Denies barriers to adherence of treatment.  Appetite is good and tells he has gained approx 30 lbs from the time of hospitalization. Denies being sexually active, partner wants his to be undetectable  before any sexual activity. He is not working and has applied for disability. Has cut down smoking to 4-5 cigarettes a day, denies alcohol and using illicit drugs. Saw dentist yesterday. He is willing to get influenza and flu vaccine today. Doing well and no complaints.   ROS: Denies dysphagia, odynophagia, cough, fever, nausea, vomiting, diarrhea, constipation, weight loss, chills, night sweats, recent hospitalizations, rashes, joint complaints, shortness of breath, headaches, chest pain, abdominal pain, dysuria  No current outpatient medications on file prior to visit.   No current facility-administered medications on file prior to visit.    No Known Allergies  Past Medical History:  Diagnosis Date   Hernia, inguinal, right    HIV (human immunodeficiency virus infection) ([place])    Past Surgical History:  Procedure Laterality Date   BRONCHIAL WASHINGS  10/05/2020   Procedure: BRONCHIAL WASHINGS;   Surgeon: Julian Hy, DO;  Location: [facility];  Service: Endoscopy;;   INGUINAL HERNIA REPAIR Right 02/17/2018   Procedure: OPEN HERNIA REPAIR RIGHT INGUINAL INCARCERATED WITH DIAGONSTIC LAPAROSCOPY FOR INCARCERATED HERNIA;  Surgeon: Ileana Roup, MD;  Location: [facility];  Service: General;  Laterality: Right;   VIDEO BRONCHOSCOPY N/A 10/05/2020   Procedure: VIDEO BRONCHOSCOPY WITHOUT FLUORO;  Surgeon: Julian Hy, DO;  Location: [facility];  Service: Endoscopy;  Laterality: N/A;   Social History   Socioeconomic History   Marital status: Single    Spouse name: Not on file   Number of children: Not on file   Years of education: Not on file   Highest education level: Not on file  Occupational History   Not on file  Tobacco  Use   Smoking status: Every Day    Packs/day: 1.00    Types: Cigarettes   Smokeless tobacco: Never  Substance and Sexual Activity   Alcohol use: Yes   Drug use: No   Sexual activity: Not on file  Other Topics Concern   Not on file  Social History Narrative   ** Merged History Encounter **       Social Determinants of Health   Financial Resource Strain: Not on file  Food Insecurity: Not on file  Transportation Needs: Not on file  Physical Activity: Not on file  Stress: Not on file  Social Connections: Not on file  Intimate Partner Violence: Not on file   Vitals  BP 118/78   Pulse 94   Resp 16   Ht 5\' 9"  (1.753 m)   Wt 152 lb (68.9 kg)   SpO2 91%   BMI 22.45 kg/m   Examination  Gen: Alert and oriented x 3, no acute distress, HEENT: [place]/AT, no scleral icterus, no pale conjunctivae, hearing normal, oral mucosa moist, MISSING TEETH, NO ORAL THRUSH Neck: Supple Cardio: Regular rate and rhythm; +S1 and S2 Resp: Respiratory effort normal GI: Soft, nontender, nondistended GU: Musc: Extremities: No cyanosis, clubbing, or edema Skin: No rashes, lesions, or ecchymoses Neuro: Grossly non focal  Psych: Calm, cooperative  Lab  Results HIV 1 RNA Quant (copies/mL)  Date Value  01/21/2021 144 (H)   CD4 T Cell Abs (/uL)  Date Value  01/21/2021 165 (L)  09/30/2020 39 (L)   No results found for: HIV1GENOSEQ Lab Results  Component Value Date   WBC 10.1 10/13/2020   HGB 11.8 (L) 10/13/2020   HCT 38.2 (L) 10/13/2020   MCV 86.4 10/13/2020   PLT 375 10/13/2020    Lab Results  Component Value Date   CREATININE 0.99 10/13/2020   BUN 10 10/13/2020   NA 133 (L) 10/13/2020   K 4.1 10/13/2020   CL 100 10/13/2020   CO2 26 10/13/2020   Lab Results  Component Value Date   ALT 12 10/13/2020   AST 19 10/13/2020   ALKPHOS 517 (H) 10/13/2020   BILITOT 0.5 10/13/2020    Lab Results  Component Value Date   CHOL 192 01/21/2021   TRIG 113 01/21/2021   HDL 45 01/21/2021   LDLCALC 125 (H) 01/21/2021   Lab Results  Component Value Date   HAV NON-REACTIVE 01/21/2021   Lab Results  Component Value Date   HEPBSAG NON REACTIVE 10/02/2020   Lab Results  Component Value Date   HCVAB NON REACTIVE 10/02/2020   Lab Results  Component Value Date   CHLAMYDIAWP Negative 01/21/2021   N Negative 01/21/2021   No results found for: GCPROBEAPT No results found for: [place] Maintenance: Immunization History  Administered Date(s) Administered   Influenza,inj,Quad PF,6+ Mos 05/03/2021   PPD Test 09/30/2020   Assessment/Plan  # 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  Electronically signed by:  Rosiland Oz, MD Infectious  Disease Physician [facility] for Infectious Disease [address] [place], [place] [address] Phone: [phone]  Fax: [phone]

## 2021-05-08 LAB — COMPREHENSIVE METABOLIC PANEL
AG Ratio: 1.2 (calc) (ref 1.0–2.5)
ALT: 8 U/L — ABNORMAL LOW (ref 9–46)
AST: 18 U/L (ref 10–35)
Albumin: 4.1 g/dL (ref 3.6–5.1)
Alkaline phosphatase (APISO): 88 U/L (ref 35–144)
BUN: 15 mg/dL (ref 7–25)
CO2: 24 mmol/L (ref 20–32)
Calcium: 9.4 mg/dL (ref 8.6–10.3)
Chloride: 107 mmol/L (ref 98–110)
Creat: 1.23 mg/dL (ref 0.70–1.35)
Globulin: 3.3 g/dL (calc) (ref 1.9–3.7)
Glucose, Bld: 74 mg/dL (ref 65–99)
Potassium: 4.5 mmol/L (ref 3.5–5.3)
Sodium: 140 mmol/L (ref 135–146)
Total Bilirubin: 0.6 mg/dL (ref 0.2–1.2)
Total Protein: 7.4 g/dL (ref 6.1–8.1)

## 2021-05-08 LAB — HIV-1 RNA ULTRAQUANT REFLEX TO GENTYP+
HIV 1 RNA Quant: 82 copies/mL — ABNORMAL HIGH
HIV-1 RNA Quant, Log: 1.91 Log copies/mL — ABNORMAL HIGH

## 2021-05-08 LAB — RPR: RPR Ser Ql: NONREACTIVE

## 2021-05-08 LAB — T-HELPER CELLS (CD4) COUNT (NOT AT ARMC)
Absolute CD4: 237 cells/uL — ABNORMAL LOW (ref 490–1740)
CD4 T Helper %: 14 % — ABNORMAL LOW (ref 30–61)
Total lymphocyte count: 1659 cells/uL (ref 850–3900)

## 2021-08-22 ENCOUNTER — Other Ambulatory Visit: Payer: Self-pay

## 2021-08-22 ENCOUNTER — Ambulatory Visit: Payer: Self-pay

## 2021-08-22 DIAGNOSIS — Z113 Encounter for screening for infections with a predominantly sexual mode of transmission: Secondary | ICD-10-CM

## 2021-08-22 DIAGNOSIS — B20 Human immunodeficiency virus [HIV] disease: Secondary | ICD-10-CM

## 2021-08-23 LAB — T-HELPER CELL (CD4) - (RCID CLINIC ONLY)
CD4 % Helper T Cell: 13 % — ABNORMAL LOW (ref 33–65)
CD4 T Cell Abs: 209 /uL — ABNORMAL LOW (ref 400–1790)

## 2021-08-26 LAB — CBC WITH DIFFERENTIAL/PLATELET
Absolute Monocytes: 770 cells/uL (ref 200–950)
Basophils Absolute: 28 cells/uL (ref 0–200)
Basophils Relative: 0.4 %
Eosinophils Absolute: 273 cells/uL (ref 15–500)
Eosinophils Relative: 3.9 %
HCT: 43.6 % (ref 38.5–50.0)
Hemoglobin: 14.8 g/dL (ref 13.2–17.1)
Lymphs Abs: 1659 cells/uL (ref 850–3900)
MCH: 29.2 pg (ref 27.0–33.0)
MCHC: 33.9 g/dL (ref 32.0–36.0)
MCV: 86.2 fL (ref 80.0–100.0)
MPV: 9 fL (ref 7.5–12.5)
Monocytes Relative: 11 %
Neutro Abs: 4270 cells/uL (ref 1500–7800)
Neutrophils Relative %: 61 %
Platelets: 350 10*3/uL (ref 140–400)
RBC: 5.06 10*6/uL (ref 4.20–5.80)
RDW: 12.2 % (ref 11.0–15.0)
Total Lymphocyte: 23.7 %
WBC: 7 10*3/uL (ref 3.8–10.8)

## 2021-08-26 LAB — COMPLETE METABOLIC PANEL WITH GFR
AG Ratio: 1.2 (calc) (ref 1.0–2.5)
ALT: 8 U/L — ABNORMAL LOW (ref 9–46)
AST: 12 U/L (ref 10–35)
Albumin: 3.7 g/dL (ref 3.6–5.1)
Alkaline phosphatase (APISO): 89 U/L (ref 35–144)
BUN: 13 mg/dL (ref 7–25)
CO2: 25 mmol/L (ref 20–32)
Calcium: 8.9 mg/dL (ref 8.6–10.3)
Chloride: 108 mmol/L (ref 98–110)
Creat: 1.08 mg/dL (ref 0.70–1.35)
Globulin: 3.1 g/dL (calc) (ref 1.9–3.7)
Glucose, Bld: 89 mg/dL (ref 65–99)
Potassium: 4.4 mmol/L (ref 3.5–5.3)
Sodium: 140 mmol/L (ref 135–146)
Total Bilirubin: 0.4 mg/dL (ref 0.2–1.2)
Total Protein: 6.8 g/dL (ref 6.1–8.1)
eGFR: 78 mL/min/{1.73_m2} (ref >=60)

## 2021-08-26 LAB — HIV-1 RNA QUANT-NO REFLEX-BLD
HIV 1 RNA Quant: 29 Copies/mL — ABNORMAL HIGH
HIV-1 RNA Quant, Log: 1.47 Log cps/mL — ABNORMAL HIGH

## 2021-08-26 LAB — RPR: RPR Ser Ql: NONREACTIVE

## 2021-09-03 ENCOUNTER — Ambulatory Visit: Payer: Self-pay | Admitting: Infectious Diseases

## 2021-09-03 ENCOUNTER — Telehealth: Payer: Self-pay

## 2021-09-03 NOTE — Telephone Encounter (Signed)
Called patient to reschedule missed appointment. No answer. Name stated on identified voicemail did not match the patient's name. Did not leave message. ? ?Binnie Kand, RN   ?

## 2021-09-10 ENCOUNTER — Encounter: Payer: Self-pay | Admitting: Infectious Diseases

## 2021-09-10 ENCOUNTER — Other Ambulatory Visit: Payer: Self-pay

## 2021-09-10 ENCOUNTER — Ambulatory Visit (INDEPENDENT_AMBULATORY_CARE_PROVIDER_SITE_OTHER): Payer: Self-pay | Admitting: Infectious Diseases

## 2021-09-10 VITALS — BP 119/79 | HR 84 | Temp 98.1°F | Ht 69.0 in | Wt 146.0 lb

## 2021-09-10 DIAGNOSIS — Z23 Encounter for immunization: Secondary | ICD-10-CM

## 2021-09-10 DIAGNOSIS — Z129 Encounter for screening for malignant neoplasm, site unspecified: Secondary | ICD-10-CM | POA: Insufficient documentation

## 2021-09-10 DIAGNOSIS — Z13220 Encounter for screening for lipoid disorders: Secondary | ICD-10-CM

## 2021-09-10 DIAGNOSIS — Z113 Encounter for screening for infections with a predominantly sexual mode of transmission: Secondary | ICD-10-CM

## 2021-09-10 DIAGNOSIS — F17200 Nicotine dependence, unspecified, uncomplicated: Secondary | ICD-10-CM

## 2021-09-10 DIAGNOSIS — B1910 Unspecified viral hepatitis B without hepatic coma: Secondary | ICD-10-CM

## 2021-09-10 DIAGNOSIS — B20 Human immunodeficiency virus [HIV] disease: Secondary | ICD-10-CM

## 2021-09-10 MED ORDER — BICTEGRAVIR-EMTRICITAB-TENOFOV 50-200-25 MG PO TABS: 1.0000 | ORAL_TABLET | Freq: Every day | ORAL | 5 refills | Status: DC

## 2021-09-10 NOTE — Addendum Note (Signed)
Addended by: Lucie Leather D on: 09/10/2021 11:42 AM ? ? Modules accepted: Orders ? ?

## 2021-09-10 NOTE — Progress Notes (Signed)
?                                                           [place], [place], [place], [address] ?                                                                 Phn. [phone]; Fax: [phone] ?                                                                            Date: 09/10/21 ? ?Reason for Visit: HIV follow up  ? ? ?HPI: Steven Johnston is a 61 y.o.old male with a PMH of HIV. 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 ? ?05/03/21 ?Taking Biktarvy daily. Denies missed doses. Not taking bactrim however as he says he never got from the pharmacy. Willing to get Hep A vaccine. Denies being sexually active. Continues to smoke, 1 pack last 3-4 days, trying to quit. Denies alcohol and IVDU. He is wanting to fu with dentist for dental care and asking for contact number. Complains of itching in his bilateral forearms and legs for last 2 weeks in the setting of new soap gifted by his family. No rashes noted on exam. No concerns otherwise.  ? ?ROS: Denies dysphagia, odynophagia, cough, fever, nausea, vomiting, diarrhea, constipation, weight loss, chills, night sweats, recent hospitalizations, rashes, joint complaints, shortness of breath, headaches, chest pain, abdominal pain, dysuria ? ?Current Outpatient Medications on File Prior to Visit  ?Medication Sig Dispense Refill  ? bictegravir-emtricitabine-tenofovir AF (BIKTARVY) 50-200-25 MG TABS tablet Take 1 tablet by mouth daily. 30 tablet 3  ? ?No current facility-administered medications on file prior to visit.  ? ? ?No Known Allergies ? ?Past Medical History:  ?Diagnosis Date  ? Hernia, inguinal, right   ? HIV (human immunodeficiency virus infection) ([place])   ? ?Past Surgical History:  ?Procedure Laterality Date  ? BRONCHIAL WASHINGS  10/05/2020  ? Procedure:  BRONCHIAL WASHINGS;  Surgeon: Julian Hy, DO;  Location: [facility];  Service: Endoscopy;;  ? INGUINAL HERNIA REPAIR Right 02/17/2018  ? Procedure: OPEN HERNIA REPAIR RIGHT INGUINAL INCARCERATED WITH DIAGONSTIC LAPAROSCOPY FOR INCARCERATED HERNIA;  Surgeon: Ileana Roup, MD;  Location: [facility];  Service: General;  Laterality: Right;  ? VIDEO BRONCHOSCOPY N/A 10/05/2020  ? Procedure: VIDEO BRONCHOSCOPY WITHOUT FLUORO;  Surgeon: Julian Hy, DO;  Location: [facility];  Service: Endoscopy;  Laterality: N/A;  ? ?Social History  ? ?Socioeconomic History  ? Marital status: Single  ?  Spouse name: Not on file  ? Number of children: Not on file  ? Years of education: Not on file  ? Highest education level: Not on file  ?Occupational History  ?  Not on file  ?Tobacco Use  ? Smoking status: Every Day  ?  Packs/day: 1.00  ?  Types: Cigarettes  ? Smokeless tobacco: Never  ?Substance and Sexual Activity  ? Alcohol use: Yes  ? Drug use: No  ? Sexual activity: Not on file  ?Other Topics Concern  ? Not on file  ?Social History Narrative  ? ** Merged History Encounter **  ?    ? ?Social Determinants of Health  ? ?Financial Resource Strain: Not on file  ?Food Insecurity: Not on file  ?Transportation Needs: Not on file  ?Physical Activity: Not on file  ?Stress: Not on file  ?Social Connections: Not on file  ?Intimate Partner Violence: Not on file  ? ?Vitals  ?BP 119/79   Pulse 84   Temp 98.1 ?F (36.7 ?C) (Temporal)   Ht 5\' 9"  (1.753 m)   Wt 146 lb (66.2 kg)   SpO2 92%   BMI 21.56 kg/m?  ? ? ?Examination  ?Gen: Alert and oriented x 3, no acute distress, ?HEENT: [place]/AT, no scleral icterus, no pale conjunctivae, hearing normal, oral mucosa moist, MISSING TEETH, NO ORAL THRUSH ?Neck: Supple ?Cardio: Regular rate and rhythm ?Resp: Respiratory effort normal ?GI: Soft, nondistended ?GU: ?Musc: ?Extremities: No cyanosis, clubbing, or edema ?Skin: No rashes, lesions, or ecchymoses ?Neuro: Grossly non focal  ?Psych: Calm,  cooperative ? ?Lab Results ?HIV 1 RNA Quant  ?Date Value  ?08/22/2021 29 Copies/mL (H)  ?05/03/2021 82 copies/mL (H)  ?01/21/2021 144 copies/mL (H)  ? ?CD4 T Cell Abs (/uL)  ?Date Value  ?08/22/2021 209 (L)  ?01/21/2021 165 (L)  ?09/30/2020 39 (L)  ? ?No results found for: HIV1GENOSEQ ?Lab Results  ?Component Value Date  ? WBC 7.0 08/22/2021  ? HGB 14.8 08/22/2021  ? HCT 43.6 08/22/2021  ? MCV 86.2 08/22/2021  ? PLT 350 08/22/2021  ?  ?Lab Results  ?Component Value Date  ? CREATININE 1.08 08/22/2021  ? BUN 13 08/22/2021  ? NA 140 08/22/2021  ? K 4.4 08/22/2021  ? CL 108 08/22/2021  ? CO2 25 08/22/2021  ? ?Lab Results  ?Component Value Date  ? ALT 8 (L) 08/22/2021  ? AST 12 08/22/2021  ? ALKPHOS 517 (H) 10/13/2020  ? BILITOT 0.4 08/22/2021  ?  ?Lab Results  ?Component Value Date  ? CHOL 192 01/21/2021  ? TRIG 113 01/21/2021  ? HDL 45 01/21/2021  ? LDLCALC 125 (H) 01/21/2021  ? ?Lab Results  ?Component Value Date  ? HAV NON-REACTIVE 01/21/2021  ? ?Lab Results  ?Component Value Date  ? HEPBSAG NON REACTIVE 10/02/2020  ? ?Lab Results  ?Component Value Date  ? HCVAB NON REACTIVE 10/02/2020  ? ?Lab Results  ?Component Value Date  ? CHLAMYDIAWP Negative 01/21/2021  ? N Negative 01/21/2021  ? ?No results found for: GCPROBEAPT ?No results found for: QUANTGOLD ? ? ? ?Health Maintenance: ?Immunization History  ?Administered Date(s) Administered  ? Influenza,inj,Quad PF,6+ Mos 05/03/2021  ? PPD Test 09/30/2020  ? 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 His last CD4 is over 200 and VL is less than 50, will hold it for now  ?-Meds refilled  ?-Discussed labs ?-Fu in 4 months  ? ?# STD Screening  ?- RPR negative, Urine GC today ?- no acute concerns  ? ?# Protein energy malnutrition  ?-Appetite good, weight stable  ? ?#Smoking  ?-Counseled ? ?# H/o  Hepatitis B ( Inactive) ?- hep B serology in next set of labs ?- US abdomen for  [place] screening  ? ?# Immunization ?-hepatitis A vaccine today  ? ?# Health Maintenance ?-Will follow with Dentist ?-Lipid panel in next set of labs  ? ?I have personally spent 42  minutes involved in face-to-face and non-face-to-face activities for this patient on the day of the visit.  ?Electronically signed by: ? ?Rosiland Oz, MD ?Infectious Disease Physician ?[facility] ?[address] ?[place], [place] [address] ?Phone: [phone]  Fax: [phone] ? ? ? ?

## 2021-09-11 LAB — URINE CYTOLOGY ANCILLARY ONLY
Chlamydia: NEGATIVE
Comment: NEGATIVE
Comment: NORMAL
Neisseria Gonorrhea: NEGATIVE

## 2021-09-17 ENCOUNTER — Ambulatory Visit (HOSPITAL_COMMUNITY): Payer: Self-pay

## 2021-10-01 ENCOUNTER — Ambulatory Visit (HOSPITAL_COMMUNITY): Admission: RE | Admit: 2021-10-01 | Payer: Self-pay | Source: Ambulatory Visit

## 2021-10-03 ENCOUNTER — Ambulatory Visit (HOSPITAL_COMMUNITY)
Admission: RE | Admit: 2021-10-03 | Discharge: 2021-10-03 | Disposition: A | Discharge status: Home / Self Care | Payer: Self-pay | Source: Ambulatory Visit | Attending: Infectious Diseases | Admitting: Infectious Diseases

## 2021-10-03 DIAGNOSIS — Z129 Encounter for screening for malignant neoplasm, site unspecified: Secondary | ICD-10-CM | POA: Insufficient documentation

## 2021-10-21 ENCOUNTER — Telehealth: Payer: Self-pay

## 2021-10-21 NOTE — Telephone Encounter (Signed)
Patient requests a call back to discuss results of abdominal US completed the first week of May. Confirmed best phone number is [phone].   Will route to provider.  Binnie Kand, RN

## 2021-10-21 NOTE — Telephone Encounter (Signed)
Patient requested assistance with scheduling f/u appointment. Attempted to reach patient x2, not available. Left HIPAA-compliant message asking patient to call back.  Per last office note, Dr. West Bali requested 4 month follow up around 01/10/22.  Binnie Kand, RN

## 2021-10-22 NOTE — Telephone Encounter (Signed)
Called patient on preferred number, patient's mother answered. Asked that she please have patient call his doctor's office back when he's available. Did not discuss any private or health information.   Will need to relay results and schedule follow up.  Beryle Flock, RN

## 2021-10-23 NOTE — Telephone Encounter (Signed)
Spoke with patient, relayed that ultrasound was unremarkable and scheduled him for follow up.  Beryle Flock, RN

## 2021-12-26 IMAGING — CT CT HEAD W/O CM
4 series · 16 of 47 positions shown, 18 images · non-contrast
Comparison: Prior CT from 03/12/2010.

CLINICAL DATA: Initial evaluation for neuro deficit, stroke
suspected.

EXAM:
CT HEAD WITHOUT CONTRAST
TECHNIQUE: Contiguous axial images were obtained from the base of the skull
through the vertex without intravenous contrast.

[Series 2: head without · axial · non-contrast · 0.42mm/px · z∈[+720,+854]mm · 7 of 37 slices shown, 9 images]
[im 5/37  brain]
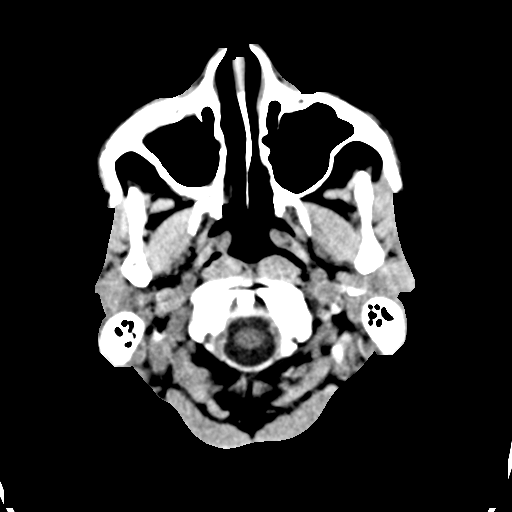
[im 5/37  bone]
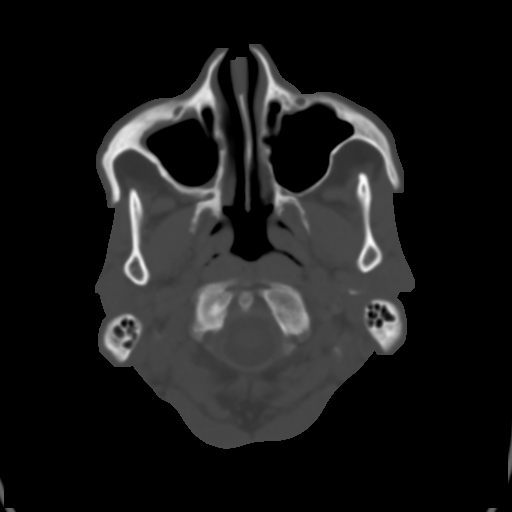
[im 10/37  brain]
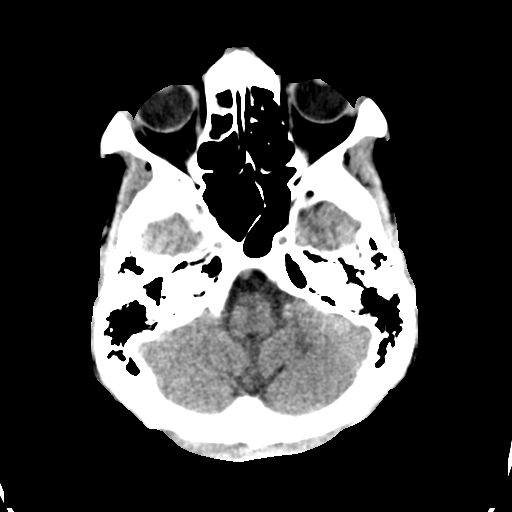
[im 14/37  brain]
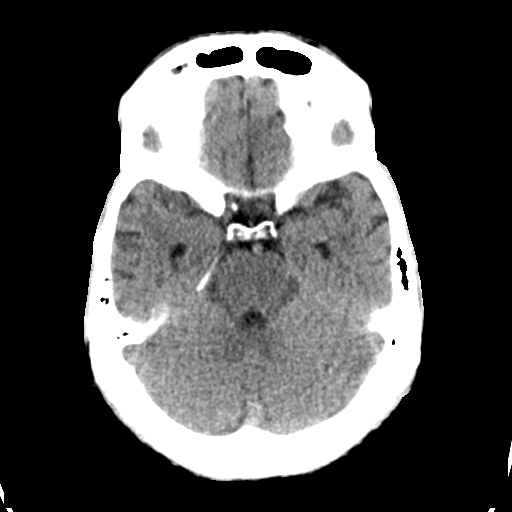
[im 19/37  brain]
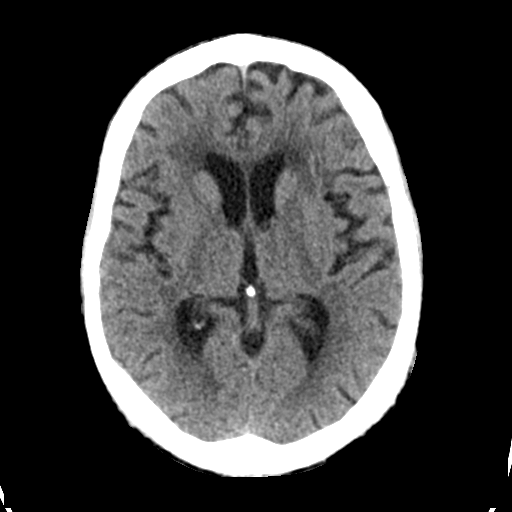
[im 23/37  brain]
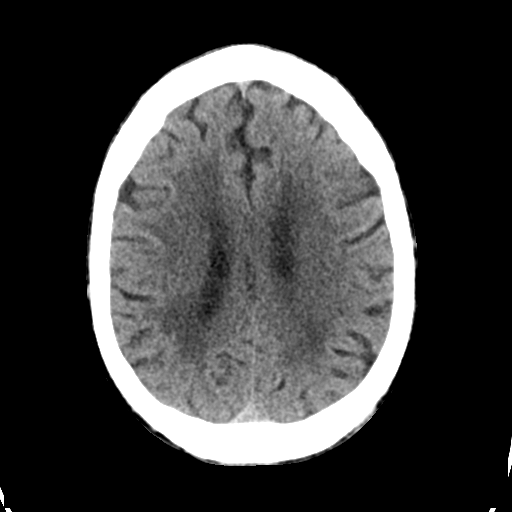
[im 23/37  bone]
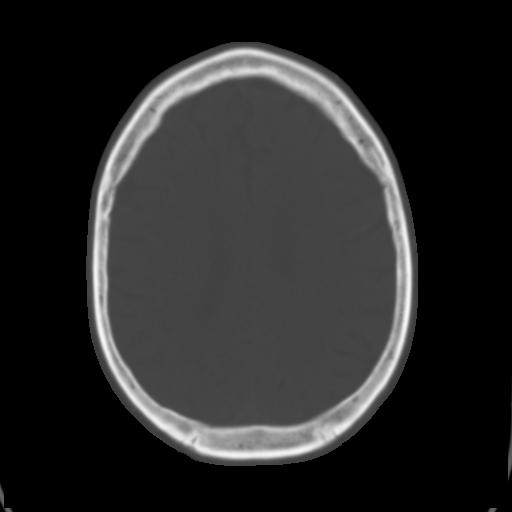
[im 28/37  brain]
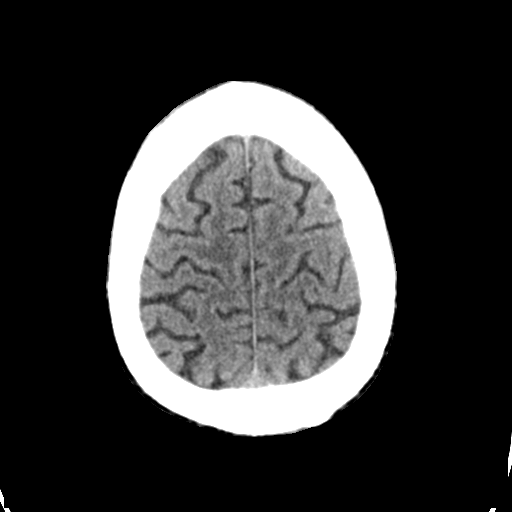
[im 32/37  brain]
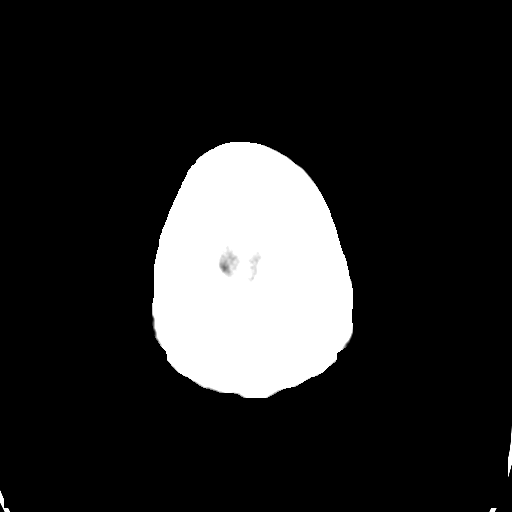

[Series 3: head bone · axial · 0.42mm/px · z∈[+718,+754]mm · 3 of 92 slices shown]
[im 10/92  bone]
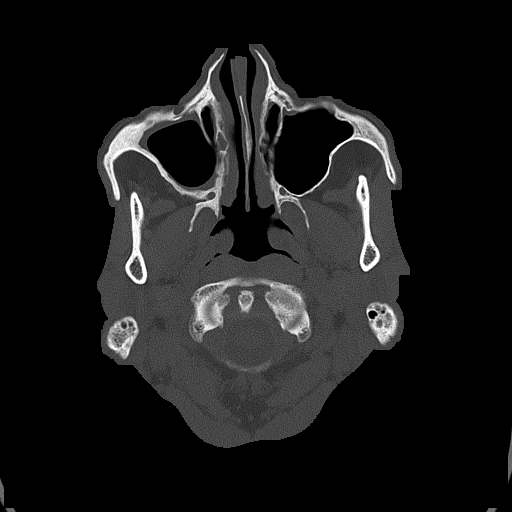
[im 19/92  bone]
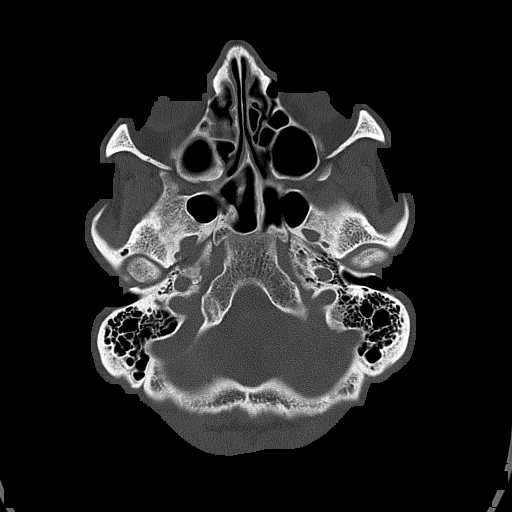
[im 28/92  bone]
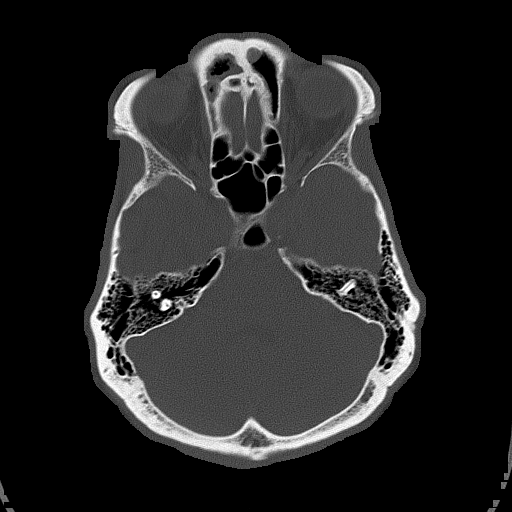

[Series 4: head without cor · coronal · non-contrast · 0.29mm/px · 3 of 67 slices shown]
[im 23/67  brain]
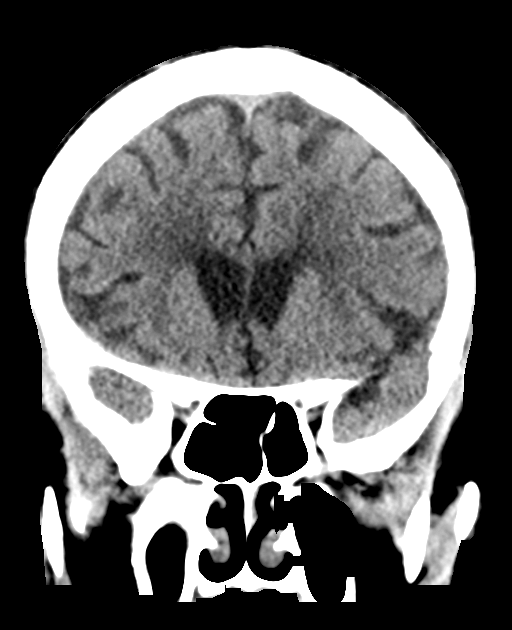
[im 30/67  brain]
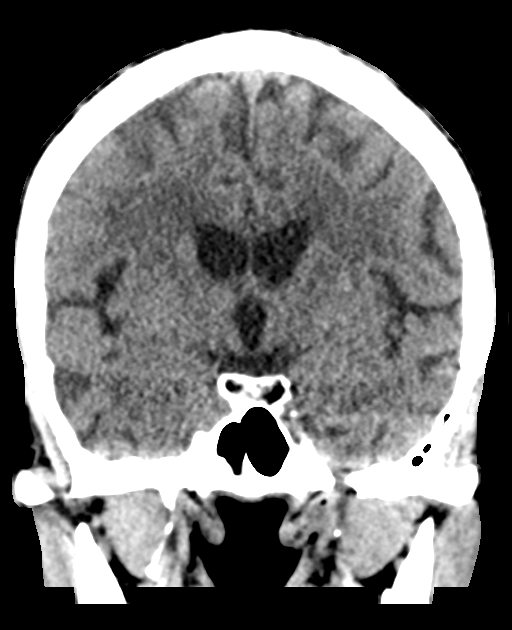
[im 37/67  brain]
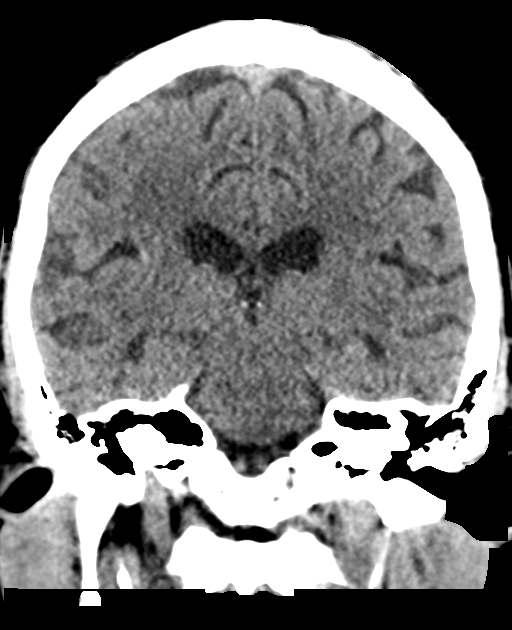

[Series 5: head without sag · sagittal · non-contrast · 0.36mm/px · 3 of 67 slices shown]
[im 23/67  brain]
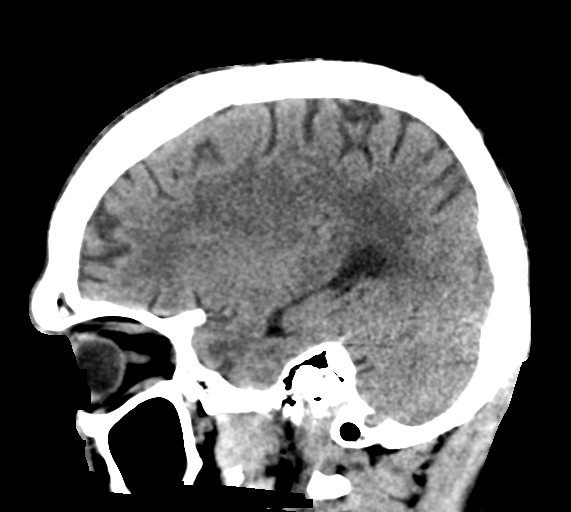
[im 34/67  brain]
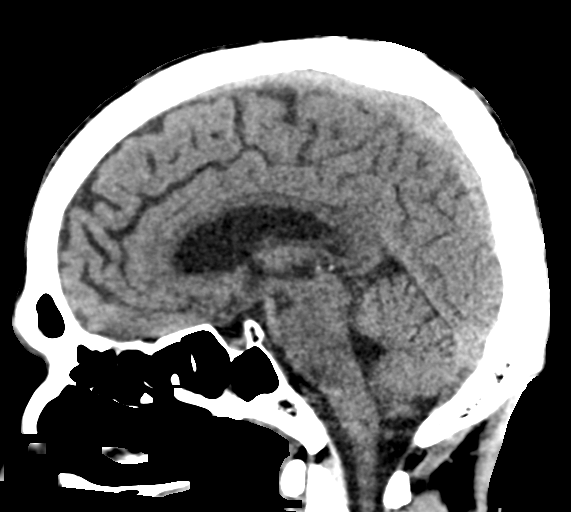
[im 45/67  brain]
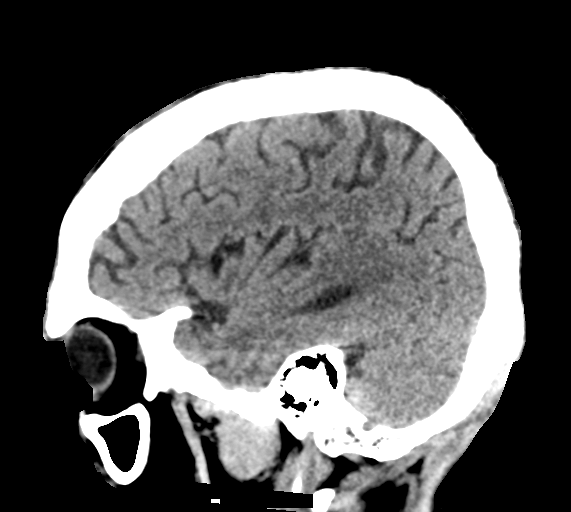

[16 of 47 positions shown; findings below may reference images not displayed]

FINDINGS: Brain: Generalized age-related cerebral atrophy. Patchy and
confluent hypodensity within the periventricular deep white matter
both cerebral hemispheres most consistent with chronic small vessel
ischemic disease, moderate in nature. Superimposed remote lacunar
infarct at the left lentiform nucleus.

No acute intracranial hemorrhage. No acute large vessel territory
infarct. No mass lesion, midline shift or mass effect. No
hydrocephalus or extra-axial fluid collection.

Vascular: No hyperdense vessel. Scattered vascular calcifications
noted within the carotid siphons.

Skull: Scalp soft tissues and calvarium within normal limits.

Sinuses/Orbits: Globes orbital soft tissues demonstrate no acute
finding. Scattered mucosal thickening within the frontoethmoidal and
right maxillary sinuses. Mastoid air cells and middle ear cavities
are clear.

Other: None.
IMPRESSION: 1. No acute intracranial abnormality.
2. Generalized age-related cerebral atrophy with moderate chronic
small vessel ischemic disease. Small remote lacunar infarct at the
left lentiform nucleus.

## 2022-01-14 ENCOUNTER — Telehealth: Payer: Self-pay

## 2022-01-14 NOTE — Telephone Encounter (Signed)
Received records request from disability determination services. Request placed at front desk to be processed by HIM department.   Beryle Flock, RN

## 2022-01-15 ENCOUNTER — Ambulatory Visit: Payer: Self-pay | Admitting: Infectious Diseases

## 2022-01-17 ENCOUNTER — Encounter: Payer: Self-pay | Admitting: Infectious Diseases

## 2022-01-17 ENCOUNTER — Ambulatory Visit (INDEPENDENT_AMBULATORY_CARE_PROVIDER_SITE_OTHER): Payer: Commercial Managed Care - HMO | Admitting: Infectious Diseases

## 2022-01-17 ENCOUNTER — Other Ambulatory Visit: Payer: Self-pay

## 2022-01-17 VITALS — BP 99/67 | HR 89 | Resp 16 | Ht 69.0 in | Wt 129.0 lb

## 2022-01-17 DIAGNOSIS — B1910 Unspecified viral hepatitis B without hepatic coma: Secondary | ICD-10-CM

## 2022-01-17 DIAGNOSIS — M25511 Pain in right shoulder: Secondary | ICD-10-CM

## 2022-01-17 DIAGNOSIS — Z113 Encounter for screening for infections with a predominantly sexual mode of transmission: Secondary | ICD-10-CM

## 2022-01-17 DIAGNOSIS — Z129 Encounter for screening for malignant neoplasm, site unspecified: Secondary | ICD-10-CM

## 2022-01-17 DIAGNOSIS — F17200 Nicotine dependence, unspecified, uncomplicated: Secondary | ICD-10-CM

## 2022-01-17 DIAGNOSIS — B20 Human immunodeficiency virus [HIV] disease: Secondary | ICD-10-CM | POA: Diagnosis not present

## 2022-01-17 MED ORDER — BICTEGRAVIR-EMTRICITAB-TENOFOV 50-200-25 MG PO TABS: 1.0000 | ORAL_TABLET | Freq: Every day | ORAL | 5 refills | Status: AC

## 2022-01-17 NOTE — Progress Notes (Incomplete)
[place], [place], [place], [address]                                                                  Phn. [phone]; Fax: [phone]                                                                             Date: 01/17/22  Reason for Visit: HIV follow up    HPI: Steven Johnston is a 61 y.o.old male with a PMH of HIV. 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  01/17/22 Taking Biktarvy without missing doses. He is not taking Bactrim. Weight 146 lbs in April and 129 today. He has good appetite. Continues to smoke, down from 12 to 5 cigarettes a day. Denies alcohol and IVDU. He is not sexually active. He is not working. Lives with GF and her parents.   Complains of unable to raise rt shoulder  since early July. No trauma. No other joints involved. Denies fevers, chills, sweats. Denies pain, tenderness and swelling in rt shoulder. Denies any other joint complaints or rashes. He would like to see a PCP and fu with dental.  ROS: Denies dysphagia, odynophagia, cough, fever, nausea, vomiting, diarrhea, constipation, chills, night sweats, recent hospitalizations, rashes, shortness of breath, headaches, chest pain, abdominal pain, dysuria  Current Outpatient Medications on File Prior to Visit  Medication Sig Dispense Refill   bictegravir-emtricitabine-tenofovir AF (BIKTARVY) 50-200-25 MG TABS tablet Take 1 tablet by mouth daily. 30 tablet 5   No current facility-administered medications on file prior to visit.    No Known Allergies  Past Medical History:  Diagnosis Date   Hernia, inguinal, right    HIV (human immunodeficiency virus infection) ([place])    Past Surgical History:  Procedure Laterality Date   BRONCHIAL WASHINGS   10/05/2020   Procedure: BRONCHIAL WASHINGS;  Surgeon: Julian Hy, DO;  Location: [facility];  Service: Endoscopy;;   INGUINAL HERNIA REPAIR Right 02/17/2018   Procedure: OPEN HERNIA REPAIR RIGHT INGUINAL INCARCERATED WITH DIAGONSTIC LAPAROSCOPY FOR INCARCERATED HERNIA;  Surgeon: Ileana Roup, MD;  Location: [facility];  Service: General;  Laterality: Right;   VIDEO BRONCHOSCOPY N/A 10/05/2020   Procedure: VIDEO BRONCHOSCOPY WITHOUT FLUORO;  Surgeon: Julian Hy, DO;  Location: [facility];  Service: Endoscopy;  Laterality: N/A;   Social History   Socioeconomic History   Marital status: Single    Spouse name: Not on file   Number of children: Not on file   Years of education: Not on file   Highest education level:  Not on file  Occupational History   Not on file  Tobacco Use   Smoking status: Every Day    Packs/day: 0.30    Types: Cigarettes   Smokeless tobacco: Never  Substance and Sexual Activity   Alcohol use: Yes    Comment: for celebrations   Drug use: No   Sexual activity: Not on file  Other Topics Concern   Not on file  Social History Narrative   ** Merged History Encounter **       Social Determinants of Health   Financial Resource Strain: Not on file  Food Insecurity: Not on file  Transportation Needs: Not on file  Physical Activity: Not on file  Stress: Not on file  Social Connections: Not on file  Intimate Partner Violence: Not on file   Vitals  BP 99/67   Pulse 89   Resp 16   Ht 5\' 9"  (1.753 m)   Wt 129 lb (58.5 kg)   SpO2 93%   BMI 19.05 kg/m    Examination  Gen: Alert and oriented x 3, no acute distress, HEENT: [place]/AT, no scleral icterus, no pale conjunctivae, hearing normal, oral mucosa moist, MISSING TEETH, DENTAL CARIES, NO ORAL THRUSH Neck: Supple Cardio: Regular rate and rhythm Resp: Respiratory effort normal on room air, Bilateral equal air entry GI: Soft, nondistended GU: Musc: Restricted ROM in rt shoulder. No signs of septic rt  shoulder Extremities: No pedal edema Skin: No rashes Neuro: Grossly non focal  Psych: Calm, cooperative  Lab Results HIV 1 RNA Quant  Date Value  08/22/2021 29 Copies/mL (H)  05/03/2021 82 copies/mL (H)  01/21/2021 144 copies/mL (H)   CD4 T Cell Abs (/uL)  Date Value  08/22/2021 209 (L)  01/21/2021 165 (L)  09/30/2020 39 (L)   No results found for: "HIV1GENOSEQ" Lab Results  Component Value Date   WBC 7.0 08/22/2021   HGB 14.8 08/22/2021   HCT 43.6 08/22/2021   MCV 86.2 08/22/2021   PLT 350 08/22/2021    Lab Results  Component Value Date   CREATININE 1.08 08/22/2021   BUN 13 08/22/2021   NA 140 08/22/2021   K 4.4 08/22/2021   CL 108 08/22/2021   CO2 25 08/22/2021   Lab Results  Component Value Date   ALT 8 (L) 08/22/2021   AST 12 08/22/2021   ALKPHOS 517 (H) 10/13/2020   BILITOT 0.4 08/22/2021    Lab Results  Component Value Date   CHOL 192 01/21/2021   TRIG 113 01/21/2021   HDL 45 01/21/2021   LDLCALC 125 (H) 01/21/2021   Lab Results  Component Value Date   HAV NON-REACTIVE 01/21/2021   Lab Results  Component Value Date   HEPBSAG NON REACTIVE 10/02/2020   Lab Results  Component Value Date   HCVAB NON REACTIVE 10/02/2020   Lab Results  Component Value Date   CHLAMYDIAWP Negative 09/10/2021   N Negative 09/10/2021   No results found for: "GCPROBEAPT" No results found for: "QUANTGOLD"    Health Maintenance: Immunization History  Administered Date(s) Administered   Hepatitis A, Adult 09/10/2021   Influenza,inj,Quad PF,6+ Mos 05/03/2021   PPD Test 09/30/2020   Problem List Items Addressed This Visit       Digestive   Hepatitis B infection without delta agent without hepatic coma   Relevant Medications   bictegravir-emtricitabine-tenofovir AF (BIKTARVY) 50-200-25 MG TABS tablet   Other Relevant Orders   Hepatitis B core antibody, total   Hepatitis B DNA, ultraquantitative, PCR  Hepatitis B e antibody   Hepatitis B e antigen    Hepatitis B surface antibody,qualitative   Hepatitis B surface antigen     Other   HIV disease ([place]) - Primary   Relevant Medications   bictegravir-emtricitabine-tenofovir AF (BIKTARVY) 50-200-25 MG TABS tablet   Other Relevant Orders   HIV RNA, RTPCR W/R GT (RTI, PI,INT)   Lipid panel (Completed)   Ambulatory referral to Internal Medicine   T-helper cells (CD4) count (not at [facility])   Screening for STDs (sexually transmitted diseases)   Smoking   Cancer screening   Relevant Orders   RPR   Urine cytology ancillary only   US Abdomen Complete   Right shoulder pain   Relevant Orders   DG Shoulder Right    Assessment/Plan  # 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    -Continue Biktarvy PO daily - Will continue to hold Bactrim pending labs today  -Meds refilled  -Labs today  -Fu in 4 months   # Rt shoulder restricted ROM - no signs of septic joint/No known trauma and injury  - X ray rt shoulder  - Pain control prn - Referred to PCP  # STD Screening  - RPR negative, Urine GC today - no acute concerns   # Protein energy malnutrition/Weight loss - Establish care with PCP for ca screening  #Smoking  -Counseled  # H/o Hepatitis B ( Inactive) - hep B serology - US abdomen for [facility] screening   # Health Maintenance -Will follow with Dentist -Lipid panel today   I have personally spent 42  minutes involved in face-to-face and non-face-to-face activities for this patient on the day of the visit.  Electronically signed by:  Rosiland Oz, MD Infectious Disease Physician [facility] [address] [place], [place] [address] Phone: [phone]  Fax: [phone]

## 2022-01-18 DIAGNOSIS — M25511 Pain in right shoulder: Secondary | ICD-10-CM | POA: Insufficient documentation

## 2022-01-18 DIAGNOSIS — B1910 Unspecified viral hepatitis B without hepatic coma: Secondary | ICD-10-CM | POA: Insufficient documentation

## 2022-01-22 LAB — RPR: RPR Ser Ql: NONREACTIVE

## 2022-01-22 LAB — HEPATITIS B SURFACE ANTIGEN: Hepatitis B Surface Ag: NONREACTIVE

## 2022-01-22 LAB — LIPID PANEL
Cholesterol: 132 mg/dL (ref <200)
HDL: 34 mg/dL — ABNORMAL LOW (ref >=40)
LDL Cholesterol (Calc): 84 mg/dL (calc)
Non-HDL Cholesterol (Calc): 98 mg/dL (calc) (ref <130)
Total CHOL/HDL Ratio: 3.9 (calc) (ref <5.0)
Triglycerides: 64 mg/dL (ref <150)

## 2022-01-22 LAB — T-HELPER CELLS (CD4) COUNT (NOT AT ARMC)
Absolute CD4: 205 cells/uL — ABNORMAL LOW (ref 490–1740)
CD4 T Helper %: 15 % — ABNORMAL LOW (ref 30–61)
Total lymphocyte count: 1343 cells/uL (ref 850–3900)

## 2022-01-22 LAB — HIV RNA, RTPCR W/R GT (RTI, PI,INT)
HIV 1 RNA Quant: <20 copies/mL — AB
HIV-1 RNA Quant, Log: <1.3 Log copies/mL — AB

## 2022-01-22 LAB — HEPATITIS B E ANTIGEN: Hep B E Ag: NONREACTIVE

## 2022-01-22 LAB — HEPATITIS B SURFACE ANTIBODY,QUALITATIVE: Hep B S Ab: REACTIVE — AB

## 2022-01-22 LAB — HEPATITIS B CORE ANTIBODY, TOTAL: Hep B Core Total Ab: REACTIVE — AB

## 2022-01-22 LAB — HEPATITIS B DNA, ULTRAQUANTITATIVE, PCR
Hepatitis B DNA: NOT DETECTED IU/mL
Hepatitis B virus DNA: NOT DETECTED Log IU/mL

## 2022-01-22 LAB — HEPATITIS B E ANTIBODY: Hep B E Ab: NONREACTIVE

## 2022-01-24 ENCOUNTER — Telehealth: Payer: Self-pay

## 2022-01-24 NOTE — Telephone Encounter (Signed)
Attempted to call patient to relay results per Dr. West Bali. Patient was not at home. Left HIPAA-compliant message requesting a call back.  Binnie Kand, RN

## 2022-01-24 NOTE — Telephone Encounter (Signed)
-----   Message from Rosiland Oz, MD sent at 01/24/2022  2:19 PM EDT ----- Please let him know he is undetectable, continue ART as discussed.

## 2022-01-27 NOTE — Telephone Encounter (Signed)
Called patient, not available at this time. Left HIPAA compliant voicemail requesting callback.   Beryle Flock, RN

## 2022-01-28 NOTE — Telephone Encounter (Signed)
Third attempt to reach patient, no answer. Voicemail left with previous attempts.   Beryle Flock, RN

## 2022-02-05 ENCOUNTER — Encounter (HOSPITAL_COMMUNITY): Payer: Self-pay | Admitting: Emergency Medicine

## 2022-02-05 ENCOUNTER — Emergency Department (HOSPITAL_COMMUNITY): Payer: Commercial Managed Care - HMO

## 2022-02-05 ENCOUNTER — Inpatient Hospital Stay (HOSPITAL_COMMUNITY): Payer: Commercial Managed Care - HMO

## 2022-02-05 ENCOUNTER — Other Ambulatory Visit: Payer: Self-pay

## 2022-02-05 ENCOUNTER — Inpatient Hospital Stay (HOSPITAL_COMMUNITY)
Admission: EM | Admit: 2022-02-05 | Discharge: 2022-02-07 | DRG: 988 | Disposition: A | Discharge status: Home / Self Care | Payer: Commercial Managed Care - HMO | Attending: Family Medicine | Admitting: Family Medicine

## 2022-02-05 ENCOUNTER — Ambulatory Visit (INDEPENDENT_AMBULATORY_CARE_PROVIDER_SITE_OTHER)
Admission: EM | Admit: 2022-02-05 | Discharge: 2022-02-05 | Disposition: A | Discharge status: Home / Self Care | Payer: Commercial Managed Care - HMO | Source: Home / Self Care | Attending: Internal Medicine | Admitting: Internal Medicine

## 2022-02-05 ENCOUNTER — Ambulatory Visit (INDEPENDENT_AMBULATORY_CARE_PROVIDER_SITE_OTHER): Payer: Commercial Managed Care - HMO

## 2022-02-05 DIAGNOSIS — M25511 Pain in right shoulder: Secondary | ICD-10-CM | POA: Diagnosis present

## 2022-02-05 DIAGNOSIS — R64 Cachexia: Secondary | ICD-10-CM | POA: Diagnosis present

## 2022-02-05 DIAGNOSIS — B20 Human immunodeficiency virus [HIV] disease: Secondary | ICD-10-CM | POA: Diagnosis present

## 2022-02-05 DIAGNOSIS — Z8619 Personal history of other infectious and parasitic diseases: Secondary | ICD-10-CM

## 2022-02-05 DIAGNOSIS — M869 Osteomyelitis, unspecified: Secondary | ICD-10-CM | POA: Diagnosis not present

## 2022-02-05 DIAGNOSIS — Z79899 Other long term (current) drug therapy: Secondary | ICD-10-CM | POA: Diagnosis not present

## 2022-02-05 DIAGNOSIS — C3491 Malignant neoplasm of unspecified part of right bronchus or lung: Secondary | ICD-10-CM | POA: Diagnosis present

## 2022-02-05 DIAGNOSIS — Z720 Tobacco use: Secondary | ICD-10-CM | POA: Diagnosis not present

## 2022-02-05 DIAGNOSIS — J441 Chronic obstructive pulmonary disease with (acute) exacerbation: Secondary | ICD-10-CM | POA: Diagnosis present

## 2022-02-05 DIAGNOSIS — C3492 Malignant neoplasm of unspecified part of left bronchus or lung: Secondary | ICD-10-CM | POA: Diagnosis present

## 2022-02-05 DIAGNOSIS — R634 Abnormal weight loss: Secondary | ICD-10-CM | POA: Diagnosis present

## 2022-02-05 DIAGNOSIS — F17210 Nicotine dependence, cigarettes, uncomplicated: Secondary | ICD-10-CM | POA: Diagnosis present

## 2022-02-05 DIAGNOSIS — C799 Secondary malignant neoplasm of unspecified site: Secondary | ICD-10-CM

## 2022-02-05 DIAGNOSIS — Z20822 Contact with and (suspected) exposure to covid-19: Secondary | ICD-10-CM | POA: Diagnosis present

## 2022-02-05 DIAGNOSIS — C7951 Secondary malignant neoplasm of bone: Secondary | ICD-10-CM | POA: Diagnosis present

## 2022-02-05 DIAGNOSIS — J189 Pneumonia, unspecified organism: Secondary | ICD-10-CM | POA: Diagnosis not present

## 2022-02-05 DIAGNOSIS — J159 Unspecified bacterial pneumonia: Secondary | ICD-10-CM | POA: Insufficient documentation

## 2022-02-05 DIAGNOSIS — R918 Other nonspecific abnormal finding of lung field: Secondary | ICD-10-CM | POA: Diagnosis not present

## 2022-02-05 DIAGNOSIS — Z681 Body mass index (BMI) 19 or less, adult: Secondary | ICD-10-CM

## 2022-02-05 DIAGNOSIS — R590 Localized enlarged lymph nodes: Secondary | ICD-10-CM | POA: Diagnosis not present

## 2022-02-05 DIAGNOSIS — D75839 Thrombocytosis, unspecified: Secondary | ICD-10-CM | POA: Diagnosis present

## 2022-02-05 DIAGNOSIS — R599 Enlarged lymph nodes, unspecified: Secondary | ICD-10-CM | POA: Diagnosis not present

## 2022-02-05 DIAGNOSIS — C770 Secondary and unspecified malignant neoplasm of lymph nodes of head, face and neck: Secondary | ICD-10-CM | POA: Diagnosis present

## 2022-02-05 LAB — CBC WITH DIFFERENTIAL/PLATELET
Abs Immature Granulocytes: 0.03 10*3/uL (ref 0.00–0.07)
Basophils Absolute: 0 10*3/uL (ref 0.0–0.1)
Basophils Relative: 1 %
Eosinophils Absolute: 0.5 10*3/uL (ref 0.0–0.5)
Eosinophils Relative: 6 %
HCT: 41.8 % (ref 39.0–52.0)
Hemoglobin: 13 g/dL (ref 13.0–17.0)
Immature Granulocytes: 0 %
Lymphocytes Relative: 13 %
Lymphs Abs: 1.1 10*3/uL (ref 0.7–4.0)
MCH: 25.8 pg — ABNORMAL LOW (ref 26.0–34.0)
MCHC: 31.1 g/dL (ref 30.0–36.0)
MCV: 82.9 fL (ref 80.0–100.0)
Monocytes Absolute: 0.6 10*3/uL (ref 0.1–1.0)
Monocytes Relative: 7 %
Neutro Abs: 6.3 10*3/uL (ref 1.7–7.7)
Neutrophils Relative %: 73 %
Platelets: 560 10*3/uL — ABNORMAL HIGH (ref 150–400)
RBC: 5.04 MIL/uL (ref 4.22–5.81)
RDW: 14.6 % (ref 11.5–15.5)
WBC: 8.7 10*3/uL (ref 4.0–10.5)
nRBC: 0 % (ref 0.0–0.2)

## 2022-02-05 LAB — COMPREHENSIVE METABOLIC PANEL
ALT: 13 U/L (ref 0–44)
AST: 18 U/L (ref 15–41)
Albumin: 2.6 g/dL — ABNORMAL LOW (ref 3.5–5.0)
Alkaline Phosphatase: 62 U/L (ref 38–126)
Anion gap: 10 (ref 5–15)
BUN: 16 mg/dL (ref 8–23)
CO2: 26 mmol/L (ref 22–32)
Calcium: 9 mg/dL (ref 8.9–10.3)
Chloride: 104 mmol/L (ref 98–111)
Creatinine, Ser: 0.98 mg/dL (ref 0.61–1.24)
GFR, Estimated: >60 mL/min (ref >60)
Glucose, Bld: 93 mg/dL (ref 70–99)
Potassium: 4 mmol/L (ref 3.5–5.1)
Sodium: 140 mmol/L (ref 135–145)
Total Bilirubin: 0.4 mg/dL (ref 0.3–1.2)
Total Protein: 7.2 g/dL (ref 6.5–8.1)

## 2022-02-05 LAB — MRSA NEXT GEN BY PCR, NASAL: MRSA by PCR Next Gen: NOT DETECTED

## 2022-02-05 LAB — SARS CORONAVIRUS 2 BY RT PCR: SARS Coronavirus 2 by RT PCR: NEGATIVE

## 2022-02-05 LAB — STREP PNEUMONIAE URINARY ANTIGEN: Strep Pneumo Urinary Antigen: NEGATIVE

## 2022-02-05 MED ORDER — SODIUM CHLORIDE 0.9% FLUSH
3.0000 mL | Freq: Two times a day (BID) | INTRAVENOUS | Status: DC
  Administered 2022-02-05 – 2022-02-07 (×5): 3 mL via INTRAVENOUS

## 2022-02-05 MED ORDER — SODIUM CHLORIDE 0.9 % IV SOLN
1.0000 g | Freq: Once | INTRAVENOUS | Status: AC
Start: 2022-02-05 — End: 2022-02-05
  Administered 2022-02-05: 1 g via INTRAVENOUS
  Filled 2022-02-05: qty 10

## 2022-02-05 MED ORDER — IOHEXOL 350 MG/ML SOLN
75.0000 mL | Freq: Once | INTRAVENOUS | Status: AC | PRN
  Administered 2022-02-05: 75 mL via INTRAVENOUS

## 2022-02-05 MED ORDER — ACETAMINOPHEN 325 MG PO TABS: 650.0000 mg | ORAL_TABLET | Freq: Four times a day (QID) | ORAL | Status: DC | PRN

## 2022-02-05 MED ORDER — SODIUM CHLORIDE 0.9 % IV SOLN
2.0000 g | INTRAVENOUS | Status: DC
  Filled 2022-02-05: qty 20

## 2022-02-05 MED ORDER — BICTEGRAVIR-EMTRICITAB-TENOFOV 50-200-25 MG PO TABS
1.0000 | ORAL_TABLET | Freq: Every day | ORAL | Status: DC
  Administered 2022-02-06 – 2022-02-07 (×2): 1 via ORAL
  Filled 2022-02-05 (×3): qty 1

## 2022-02-05 MED ORDER — ENSURE ENLIVE PO LIQD
237.0000 mL | Freq: Three times a day (TID) | ORAL | Status: DC
  Administered 2022-02-06 – 2022-02-07 (×4): 237 mL via ORAL
  Filled 2022-02-05 (×2): qty 237

## 2022-02-05 MED ORDER — DICLOFENAC SODIUM 1 % EX GEL
2.0000 g | Freq: Four times a day (QID) | CUTANEOUS | Status: DC
  Administered 2022-02-06 – 2022-02-07 (×5): 2 g via TOPICAL
  Filled 2022-02-05 (×2): qty 100

## 2022-02-05 MED ORDER — ONDANSETRON HCL 4 MG PO TABS: 4.0000 mg | ORAL_TABLET | Freq: Four times a day (QID) | ORAL | Status: DC | PRN

## 2022-02-05 MED ORDER — NICOTINE 14 MG/24HR TD PT24
14.0000 mg | MEDICATED_PATCH | Freq: Every day | TRANSDERMAL | Status: DC
  Administered 2022-02-05 – 2022-02-07 (×3): 14 mg via TRANSDERMAL
  Filled 2022-02-05 (×3): qty 1

## 2022-02-05 MED ORDER — ACETAMINOPHEN 650 MG RE SUPP: 650.0000 mg | Freq: Four times a day (QID) | RECTAL | Status: DC | PRN

## 2022-02-05 MED ORDER — ALBUTEROL SULFATE (2.5 MG/3ML) 0.083% IN NEBU: 2.5000 mg | INHALATION_SOLUTION | Freq: Four times a day (QID) | RESPIRATORY_TRACT | Status: DC | PRN

## 2022-02-05 MED ORDER — ENOXAPARIN SODIUM 40 MG/0.4ML IJ SOSY
40.0000 mg | PREFILLED_SYRINGE | INTRAMUSCULAR | Status: DC
  Administered 2022-02-05 – 2022-02-06 (×2): 40 mg via SUBCUTANEOUS
  Filled 2022-02-05 (×2): qty 0.4

## 2022-02-05 MED ORDER — ONDANSETRON HCL 4 MG/2ML IJ SOLN: 4.0000 mg | Freq: Four times a day (QID) | INTRAMUSCULAR | Status: DC | PRN

## 2022-02-05 MED ORDER — HYDROCODONE-ACETAMINOPHEN 5-325 MG PO TABS
1.0000 | ORAL_TABLET | ORAL | Status: DC | PRN
  Administered 2022-02-06 – 2022-02-07 (×3): 1 via ORAL
  Filled 2022-02-05 (×3): qty 1

## 2022-02-05 MED ORDER — AZITHROMYCIN 250 MG PO TABS
500.0000 mg | ORAL_TABLET | Freq: Every day | ORAL | Status: DC
  Filled 2022-02-05: qty 2

## 2022-02-05 MED ORDER — SODIUM CHLORIDE 0.9 % IV SOLN
500.0000 mg | Freq: Once | INTRAVENOUS | Status: AC
  Administered 2022-02-05: 500 mg via INTRAVENOUS
  Filled 2022-02-05: qty 5

## 2022-02-05 MED ORDER — SODIUM CHLORIDE 0.9 % IV SOLN
1.0000 g | Freq: Once | INTRAVENOUS | Status: AC
  Administered 2022-02-05: 1 g via INTRAVENOUS
  Filled 2022-02-05: qty 10

## 2022-02-05 MED ORDER — GUAIFENESIN ER 600 MG PO TB12
600.0000 mg | ORAL_TABLET | Freq: Two times a day (BID) | ORAL | Status: DC
  Administered 2022-02-05 – 2022-02-07 (×5): 600 mg via ORAL
  Filled 2022-02-05 (×5): qty 1

## 2022-02-05 NOTE — ED Provider Notes (Signed)
[facility] EMERGENCY DEPARTMENT Provider Note   CSN: [id number] Arrival date & time: 02/05/22  1014     History  Chief Complaint  Patient presents with   Cough   Shoulder Pain    Steven Johnston is a 61 y.o. male.  HPI 61 year old male history of HIV, on Biktarvy, presents today complaining of cough and dyspnea.  He was seen in urgent care chest x-Kaedon Fanelli was obtained and shows a left sided infiltrate.  Patient reports he has had weight loss of approximately 25 pounds.  He has a history of pneumonia in the past.  He has had productive cough.  Denies fever or chills.  Patient is followed at the ID clinic.  They report that his last CD4 count was greater than 200 and his viral load was nondetectable approximately 2 weeks.    Home Medications Prior to Admission medications   Medication Sig Start Date End Date Taking? Authorizing Provider  bictegravir-emtricitabine-tenofovir AF (BIKTARVY) 50-200-25 MG TABS tablet Take 1 tablet by mouth daily. 01/17/22   Rosiland Oz, MD      Allergies    Patient has no known allergies.    Review of Systems   Review of Systems  Physical Exam Updated Vital Signs BP 106/69   Pulse 92   Temp 97.7 F (36.5 C)   Resp 17   Ht 1.753 m (5\' 9" )   Wt 59 kg   SpO2 99%   BMI 19.21 kg/m  Physical Exam Vitals and nursing note reviewed.  Constitutional:      Appearance: Normal appearance.  HENT:     Head: Normocephalic.     Right Ear: External ear normal.     Nose: Nose normal.     Mouth/Throat:     Pharynx: Oropharynx is clear.  Eyes:     Pupils: Pupils are equal, round, and reactive to light.  Cardiovascular:     Rate and Rhythm: Normal rate and regular rhythm.     Pulses: Normal pulses.  Pulmonary:     Effort: Pulmonary effort is normal.     Breath sounds: Examination of the left-middle field reveals decreased breath sounds. Examination of the left-lower field reveals decreased breath sounds. Decreased breath sounds  present.  Musculoskeletal:     Cervical back: Normal range of motion.  Neurological:     Mental Status: He is alert.     ED Results / Procedures / Treatments   Labs (all labs ordered are listed, but only abnormal results are displayed) Labs Reviewed  SARS CORONAVIRUS 2 BY RT PCR  CULTURE, BLOOD (ROUTINE X 2)  CULTURE, BLOOD (ROUTINE X 2)  CBC WITH DIFFERENTIAL/PLATELET    EKG EKG Interpretation  Date/Time:  Wednesday February 05 2022 10:31:05 EDT Ventricular Rate:  85 PR Interval:  146 QRS Duration: 92 QT Interval:  390 QTC Calculation: 464 R Axis:   97 Text Interpretation: Normal sinus rhythm Rightward axis Incomplete right bundle branch block Borderline ECG When compared with ECG of 13-Oct-2020 21:52, No significant change since last tracing Confirmed by Pattricia Boss [id number] on 02/05/2022 2:48:37 PM  Radiology CT CHEST W CONTRAST  Result Date: 02/05/2022 CLINICAL DATA:  Pneumonia EXAM: CT CHEST WITH CONTRAST TECHNIQUE: Multidetector CT imaging of the chest was performed during intravenous contrast administration. RADIATION DOSE REDUCTION: This exam was performed according to the departmental dose-optimization program which includes automated exposure control, adjustment of the mA and/or kV according to patient size and/or use of iterative reconstruction technique. CONTRAST:  57mL OMNIPAQUE IOHEXOL  350 MG/ML SOLN COMPARISON:  Previous CT done on 10/01/2020 and chest radiographs done today FINDINGS: Cardiovascular: There is homogeneous enhancement in thoracic aorta. There are no intraluminal filling defects in central pulmonary artery branches. Mediastinum/Nodes: There are enlarged lymph nodes in mediastinum and hilar regions. There is a precarinal node measuring 1.3 cm in short axis. There is subcarinal node measuring 1.4 cm in short axis. There is 2 cm node in the inferior right hilum. Evaluation of left hilum for lymphadenopathy is limited by dense adjacent infiltrates.  Lungs/Pleura: There is large dense infiltrate in left upper lobe. There is 7 cm slightly inhomogeneous attenuation in the left apex. There is 5.5 cm density in the anteromedial left upper lobe. There is a 3.9 cm opacity in the left parahilar region and left lower lobe. There are other extensive patchy infiltrates in left upper lobe and left lower lobe. There is interval clearing of ground-glass densities in right lung. In image 116 of series 4, there is a new 1 cm nodule with spiculated margins in right lower lobe. In [place] one [address] 14, there is 7 mm pleural-based density in posterior right lower lung field. In image 139, there is 6 mm nodular density in the posterior right lower lobe. Small left pleural effusion is seen. There is no pneumothorax. Blebs and bullae are seen in the right apex. Upper Abdomen: There is fatty infiltration of the liver. Musculoskeletal: No focal lytic or sclerotic lesions are seen. IMPRESSION: There is interval appearance of large infiltrate in left upper lobe suggesting pneumonia. There are areas of homogeneous opacity without aeration in left upper lobe. Possibility of underlying malignant neoplastic processes not excluded. Extensive patchy infiltrates are seen in left lower lobe including 3.9 cm homogeneous opacity in the inferior left hilum. Small left pleural effusion is seen. There are enlarged lymph nodes in mediastinum and hilar regions suggesting infectious or neoplastic process. There are few nodular densities in right lower lobe largest measuring 10 mm in size with spiculated margins. This finding may suggest small foci of pneumonia or neoplastic process. Short-term follow-up CT after antibiotic treatment along with PET-CT if warranted should be considered. Electronically Signed   By: Elmer Picker M.D.   On: 02/05/2022 14:29   DG Chest 2 View  Result Date: 02/05/2022 CLINICAL DATA:  Chronic for 1 month.  Right shoulder pain. EXAM: CHEST - 2 VIEW COMPARISON:   Chest two views 09/30/2020, CT chest 10/01/2020 FINDINGS: There is mild high-grade heterogeneous airspace opacification throughout the mid and superior aspects of the left lung with diffuse left lung interstitial thickening also extending into the inferior left lung. More homogeneous opacification of the left lung apex, possible pleural fluid versus airspace opacity. The right lung is clear. No inferior layering pleural effusion is seen. No pneumothorax. The cardiac silhouette is partially obscured but does not appear enlarged. Mediastinal contours are not well evaluated. No acute skeletal abnormality. IMPRESSION: Heterogeneous airspace opacification of the mid to upper left lung with diffuse left lung interstitial thickening. Findings are suspicious for high-grade pneumonia. Note is made on prior radiographs and CT there was clinical concern for ground-glass, multifocal atypical pneumonia. Is this patient immunocompromised? Electronically Signed   By: Yvonne Kendall M.D.   On: 02/05/2022 09:50    Procedures Procedures    Medications Ordered in ED Medications  azithromycin (ZITHROMAX) 500 mg in sodium chloride 0.9 % 250 mL IVPB (has no administration in time range)  cefTRIAXone (ROCEPHIN) 1 g in sodium chloride 0.9 % 100 mL IVPB (  has no administration in time range)  cefTRIAXone (ROCEPHIN) 1 g in sodium chloride 0.9 % 100 mL IVPB (0 g Intravenous Stopped 02/05/22 1415)  iohexol (OMNIPAQUE) 350 MG/ML injection 75 mL (75 mLs Intravenous Contrast Given 02/05/22 1407)    ED Course/ Medical Decision Making/ A&P Clinical Course as of 02/05/22 1501  Wed Feb 05, 2022  1401 Comprehensive metabolic panel [DR]    Clinical Course User Index [DR] Pattricia Boss, MD                           Medical Decision Making 61 year old male HIV positive with multifocal pneumonia with increased density seen on CT scan Patient received Rocephin and Zithromax here in ED Labs obtained at urgent care are stable Care was  discussed with infectious disease, pulmonary, and hospitalist Care discussed with Mikki Harbor, PA on for pulmonary Care discussed with Dr. Fuller Plan who will see for admission  Amount and/or Complexity of Data Reviewed Labs: ordered. Decision-making details documented in ED Course. Radiology: ordered and independent interpretation performed. Decision-making details documented in ED Course. ECG/medicine tests: ordered and independent interpretation performed. Decision-making details documented in ED Course.  Risk Prescription drug management. Decision regarding hospitalization.           Final Clinical Impression(s) / ED Diagnoses Final diagnoses:  Pneumonia of left lung due to infectious organism, unspecified part of lung  HIV disease ([place])    Rx / DC Orders ED Discharge Orders     None         Pattricia Boss, MD 02/05/22 1501

## 2022-02-05 NOTE — ED Provider Notes (Signed)
[facility] EMERGENCY DEPARTMENT Provider Note   CSN: [id number] Arrival date & time: 02/05/22  1014     History  Chief Complaint  Patient presents with   Cough   Shoulder Pain    [facility] is a 61 y.o. male.  HPI 61 year old male HIV positive on antiretroviral therapy reports taking all of his medications presents today with cough and mild dyspnea over the past 6 weeks with associated weight loss.  Last CD4 count was greater than 200 and viral load was normal on labs approximately 2 weeks ago.  He was seen in urgent care today chest x-Niah Heinle obtained showed dense infiltrate in left upper lobe and patient sent to the ED for further evaluation.  Patient reports that he has previously been on Bactrim but this was stopped he is unclear why.  He is on [employer].  He reports taking all medications as prescribed    Home Medications Prior to Admission medications   Medication Sig Start Date End Date Taking? Authorizing Provider  bictegravir-emtricitabine-tenofovir AF (BIKTARVY) 50-200-25 MG TABS tablet Take 1 tablet by mouth daily. 01/17/22  Yes Rosiland Oz, MD      Allergies    Patient has no known allergies.    Review of Systems   Review of Systems  Physical Exam Updated Vital Signs BP 110/81 (BP Location: Right Arm)   Pulse 85   Temp 98.1 F (36.7 C) (Oral)   Resp (!) 23   Ht 1.753 m (5\' 9" )   Wt 59 kg   SpO2 97%   BMI 19.21 kg/m  Physical Exam Vitals and nursing note reviewed.  Constitutional:      Appearance: Normal appearance.     Comments: Cachectic male  HENT:     Head: Normocephalic.     Comments: Temporal wasting noted    Right Ear: External ear normal.     Left Ear: External ear normal.     Nose: Nose normal.     Mouth/Throat:     Pharynx: Oropharynx is clear.  Eyes:     Pupils: Pupils are equal, round, and reactive to light.  Cardiovascular:     Rate and Rhythm: Normal rate.     Pulses: Normal pulses.     Heart sounds:  Normal heart sounds.  Pulmonary:     Effort: Pulmonary effort is normal.     Breath sounds: Examination of the left-upper field reveals decreased breath sounds. Examination of the left-middle field reveals decreased breath sounds. Decreased breath sounds present.  Abdominal:     General: Abdomen is flat.     Palpations: Abdomen is soft.  Musculoskeletal:        General: Normal range of motion.     Cervical back: Normal range of motion.  Skin:    General: Skin is warm and dry.     Capillary Refill: Capillary refill takes less than 2 seconds.  Neurological:     General: No focal deficit present.     Mental Status: He is alert.  Psychiatric:        Mood and Affect: Mood normal.        Behavior: Behavior normal.     ED Results / Procedures / Treatments   Labs (all labs ordered are listed, but only abnormal results are displayed) Labs Reviewed  CBC - Abnormal; Notable for the following components:      Result Value   Hemoglobin 11.4 (*)    HCT 35.6 (*)    Platelets 476 (*)  All other components within normal limits  BASIC METABOLIC PANEL - Abnormal; Notable for the following components:   Calcium 8.0 (*)    All other components within normal limits  CULTURE, BLOOD (ROUTINE X 2)  CULTURE, BLOOD (ROUTINE X 2)  SARS CORONAVIRUS 2 BY RT PCR  MRSA NEXT GEN BY PCR, NASAL  EXPECTORATED SPUTUM ASSESSMENT W GRAM STAIN, RFLX TO RESP C  STREP PNEUMONIAE URINARY ANTIGEN  PROCALCITONIN  CBC WITH DIFFERENTIAL/PLATELET  LEGIONELLA PNEUMOPHILA SEROGP 1 UR AG  PROCALCITONIN  TSH  SEDIMENTATION RATE  RAPID URINE DRUG SCREEN, HOSP PERFORMED  SURGICAL PATHOLOGY   Complete metabolic panel reviewed interpreted and significant for decreased albumin at 2.6 but otherwise within normal limits CBC reviewed interpreted and significant for elevated platelets of 560,000 otherwise within normal limits COVID test obtained and negative EKG EKG Interpretation  Date/Time:  Wednesday February 05 2022 10:31:05 EDT Ventricular Rate:  85 PR Interval:  146 QRS Duration: 92 QT Interval:  390 QTC Calculation: 464 R Axis:   97 Text Interpretation: Normal sinus rhythm Rightward axis Incomplete right bundle branch block Borderline ECG When compared with ECG of 13-Oct-2020 21:52, No significant change since last tracing Confirmed by Pattricia Boss [phone]) on 02/05/2022 2:48:37 PM  Radiology MR SHOULDER RIGHT WO CONTRAST  Result Date: 02/05/2022 CLINICAL DATA:  Right shoulder pain. Destructive lesion involving the glenoid noted on recent CT scan. EXAM: MRI OF THE RIGHT SHOULDER WITHOUT CONTRAST TECHNIQUE: Multiplanar, multisequence MR imaging of the shoulder was performed. No intravenous contrast was administered. COMPARISON:  Chest CT 02/05/2022 FINDINGS: Rotator cuff: The rotator cuff tendons are intact. Mild tendinopathy but no full-thickness retracted tear. Muscles:  No significant findings. Biceps long head:  Intact Acromioclavicular Joint: Mild degenerative changes. Type 2-3 acromion. Significant anterior downsloping of the acromion. No lateral downsloping or subacromial spurring. Glenohumeral Joint: There is a very aggressive destructive bone lesions involving the scapula, most notably in the glenoid area but also involving the coracoid process and part of the scapular body. The bone is largely destroyed and there is a large associated soft tissue mass measuring approximately 5.0 x 4.5 cm. Osteomyelitis is a consideration but I would expect to see severe surrounding myofasciitis and cellulitis which really is not present. Also, I would expect there would be septic arthritis and involvement of the humeral head which there is not. Biopsy is recommended. Labrum:  Degenerated and likely torn. Bones: Destructive scapular bone lesion as detailed above. No other bony structures are in. Other: No subacromial/subdeltoid fluid collections to suggest bursitis. Enlarged right axillary lymph node is noted.  IMPRESSION: 1. Destructive scapular bone lesion with associated large soft tissue mass measuring approximately 5.0 x 4.5 cm. Although osteomyelitis is a consideration I think it is unlikely and more likely a neoplastic process. 2. Biopsy is recommended. 3. Intact rotator cuff tendons and long head biceps tendon. Electronically Signed   By: Marijo Sanes M.D.   On: 02/05/2022 20:09   DG Shoulder Right Port  Addendum Date: 02/05/2022   ADDENDUM REPORT: 02/05/2022 16:27 ADDENDUM: The original report was by Dr. Van Clines. The following addendum is by Dr. Van Clines: These results were called by telephone at the time of interpretation on 02/05/2022 at 4:24 pm to provider [facility] , who verbally acknowledged these results. Electronically Signed   By: Van Clines M.D.   On: 02/05/2022 16:27   Result Date: 02/05/2022 CLINICAL DATA:  Coughing.  Right shoulder pain. EXAM: RIGHT SHOULDER - 2 VIEWS COMPARISON:  Chest  radiograph 02/05/2022 FINDINGS: Permeative lytic process in the right glenoid. The proximal humerus and distal clavicle appear intact. IMPRESSION: 1. Permeative lytic lesion in the right glenoid favoring osteomyelitis given the concomitant extensive left pneumonia. Septic right glenohumeral joint not excluded. Radiology assistant personnel have been notified to put me in telephone contact with the referring physician or the referring physician's clinical representative in order to discuss these findings. Once this communication is established I will issue an addendum to this report for documentation purposes. Electronically Signed: By: Van Clines M.D. On: 02/05/2022 16:21   CT CHEST W CONTRAST  Addendum Date: 02/05/2022   ADDENDUM REPORT: 02/05/2022 16:15 ADDENDUM: Images were reviewed with additional clinical history. In the previous CT chest done on 10/01/2020, there is 1.5 cm pleural-based noncalcified nodule in the medial left upper lobe. In view of this prior finding,  imaging findings in the left lung in the current study most likely suggest extensive malignant neoplastic process with lymphangitic spread of metastatic disease throughout left lung. Nodular densities in the right lung suggest metastatic disease. Enlarged lymph nodes in mediastinum suggest metastatic lymphadenopathy. Follow-up PET-CT and tissue sampling should be considered. Electronically Signed   By: Elmer Picker M.D.   On: 02/05/2022 16:15   Result Date: 02/05/2022 CLINICAL DATA:  Pneumonia EXAM: CT CHEST WITH CONTRAST TECHNIQUE: Multidetector CT imaging of the chest was performed during intravenous contrast administration. RADIATION DOSE REDUCTION: This exam was performed according to the departmental dose-optimization program which includes automated exposure control, adjustment of the mA and/or kV according to patient size and/or use of iterative reconstruction technique. CONTRAST:  54mL OMNIPAQUE IOHEXOL 350 MG/ML SOLN COMPARISON:  Previous CT done on 10/01/2020 and chest radiographs done today FINDINGS: Cardiovascular: There is homogeneous enhancement in thoracic aorta. There are no intraluminal filling defects in central pulmonary artery branches. Mediastinum/Nodes: There are enlarged lymph nodes in mediastinum and hilar regions. There is a precarinal node measuring 1.3 cm in short axis. There is subcarinal node measuring 1.4 cm in short axis. There is 2 cm node in the inferior right hilum. Evaluation of left hilum for lymphadenopathy is limited by dense adjacent infiltrates. Lungs/Pleura: There is large dense infiltrate in left upper lobe. There is 7 cm slightly inhomogeneous attenuation in the left apex. There is 5.5 cm density in the anteromedial left upper lobe. There is a 3.9 cm opacity in the left parahilar region and left lower lobe. There are other extensive patchy infiltrates in left upper lobe and left lower lobe. There is interval clearing of ground-glass densities in right lung. In image  116 of series 4, there is a new 1 cm nodule with spiculated margins in right lower lobe. In [place] 81, there is 7 mm pleural-based density in posterior right lower lung field. In image 139, there is 6 mm nodular density in the posterior right lower lobe. Small left pleural effusion is seen. There is no pneumothorax. Blebs and bullae are seen in the right apex. Upper Abdomen: There is fatty infiltration of the liver. Musculoskeletal: No focal lytic or sclerotic lesions are seen. IMPRESSION: There is interval appearance of large infiltrate in left upper lobe suggesting pneumonia. There are areas of homogeneous opacity without aeration in left upper lobe. Possibility of underlying malignant neoplastic processes not excluded. Extensive patchy infiltrates are seen in left lower lobe including 3.9 cm homogeneous opacity in the inferior left hilum. Small left pleural effusion is seen. There are enlarged lymph nodes in mediastinum and hilar regions suggesting infectious or  neoplastic process. There are few nodular densities in right lower lobe largest measuring 10 mm in size with spiculated margins. This finding may suggest small foci of pneumonia or neoplastic process. Short-term follow-up CT after antibiotic treatment along with PET-CT if warranted should be considered. Electronically Signed: By: Elmer Picker M.D. On: 02/05/2022 14:29   DG Chest 2 View  Result Date: 02/05/2022 CLINICAL DATA:  Chronic for 1 month.  Right shoulder pain. EXAM: CHEST - 2 VIEW COMPARISON:  Chest two views 09/30/2020, CT chest 10/01/2020 FINDINGS: There is mild high-grade heterogeneous airspace opacification throughout the mid and superior aspects of the left lung with diffuse left lung interstitial thickening also extending into the inferior left lung. More homogeneous opacification of the left lung apex, possible pleural fluid versus airspace opacity. The right lung is clear. No inferior layering pleural effusion is  seen. No pneumothorax. The cardiac silhouette is partially obscured but does not appear enlarged. Mediastinal contours are not well evaluated. No acute skeletal abnormality. IMPRESSION: Heterogeneous airspace opacification of the mid to upper left lung with diffuse left lung interstitial thickening. Findings are suspicious for high-grade pneumonia. Note is made on prior radiographs and CT there was clinical concern for ground-glass, multifocal atypical pneumonia. Is this patient immunocompromised? Electronically Signed   By: Yvonne Kendall M.D.   On: 02/05/2022 09:50    Procedures Procedures    Medications Ordered in ED Medications  enoxaparin (LOVENOX) injection 40 mg (40 mg Subcutaneous Given 02/05/22 1527)  sodium chloride flush (NS) 0.9 % injection 3 mL (3 mLs Intravenous Given 02/05/22 2146)  cefTRIAXone (ROCEPHIN) 2 g in sodium chloride 0.9 % 100 mL IVPB (has no administration in time range)  azithromycin (ZITHROMAX) tablet 500 mg (has no administration in time range)  acetaminophen (TYLENOL) tablet 650 mg (has no administration in time range)    Or  acetaminophen (TYLENOL) suppository 650 mg (has no administration in time range)  ondansetron (ZOFRAN) tablet 4 mg (has no administration in time range)    Or  ondansetron (ZOFRAN) injection 4 mg (has no administration in time range)  albuterol (PROVENTIL) (2.5 MG/3ML) 0.083% nebulizer solution 2.5 mg (has no administration in time range)  guaiFENesin (MUCINEX) 12 hr tablet 600 mg (600 mg Oral Given 02/05/22 2146)  diclofenac Sodium (VOLTAREN) 1 % topical gel 2 g (0 g Topical Hold 02/05/22 2134)  bictegravir-emtricitabine-tenofovir AF (BIKTARVY) 50-200-25 MG per tablet 1 tablet (has no administration in time range)  HYDROcodone-acetaminophen (NORCO/VICODIN) 5-325 MG per tablet 1 tablet (1 tablet Oral Given 02/06/22 0124)  nicotine (NICODERM CQ - dosed in mg/24 hours) patch 14 mg (14 mg Transdermal Patch Applied 02/05/22 2145)  feeding supplement (ENSURE  ENLIVE / ENSURE PLUS) liquid 237 mL (0 mLs Oral Hold 02/05/22 2127)  lidocaine-EPINEPHrine (XYLOCAINE W/EPI) 1 %-1:100000 (with pres) injection (has no administration in time range)  cefTRIAXone (ROCEPHIN) 1 g in sodium chloride 0.9 % 100 mL IVPB (0 g Intravenous Stopped 02/05/22 1415)  azithromycin (ZITHROMAX) 500 mg in sodium chloride 0.9 % 250 mL IVPB (0 mg Intravenous Stopped 02/05/22 1550)  iohexol (OMNIPAQUE) 350 MG/ML injection 75 mL (75 mLs Intravenous Contrast Given 02/05/22 1407)  cefTRIAXone (ROCEPHIN) 1 g in sodium chloride 0.9 % 100 mL IVPB (0 g Intravenous Stopped 02/05/22 1650)    ED Course/ Medical Decision Making/ A&P Clinical Course as of 02/06/22 0953  Wed Feb 05, 2022  1401 Comprehensive metabolic panel [DR]    Clinical Course User Index [DR] Pattricia Boss, MD  Medical Decision Making 61 year old male history of HIV on antiretroviral therapy presents today with infiltrate on chest x-Jonavon Trieu. Patient with dense left upper lobe infiltrate with opacity without aeration in left upper lobe which precludes ability to include malignancy. Patient had blood cultures obtained here in the ED. Patient received Rocephin and Zithromax Consults to infectious disease and to pulmonary Plan admission to medicine service  Amount and/or Complexity of Data Reviewed Labs: ordered. Decision-making details documented in ED Course. Radiology: ordered and independent interpretation performed. Decision-making details documented in ED Course. Discussion of management or test interpretation with external provider(s): Consult to infectious disease by phone Consult to pulmonary providers by phone Consult to medicine for admission  Risk Prescription drug management. Decision regarding hospitalization.  Critical Care Total time providing critical care: 30 minutes           Final Clinical Impression(s) / ED Diagnoses Final diagnoses:  Pneumonia of left lung due to  infectious organism, unspecified part of lung  HIV disease [facility])    Rx / DC Orders ED Discharge Orders     None         Pattricia Boss, MD 02/06/22 [phone]

## 2022-02-05 NOTE — H&P (Signed)
History and Physical    Patient: [facility] XFG:[id number] DOB: February 23, 1961 DOA: 02/05/2022 DOS: the patient was seen and examined on 02/05/2022 PCP: Patient, No Pcp Per  Patient coming from: Home  Chief Complaint:  Chief Complaint  Patient presents with   Cough   Shoulder Pain   HPI: Steven Johnston is a 61 y.o. 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  His cough has been intermittently productive with yellowish to greenish sputum.  Associated symptoms include shortness of with activity, weight loss of approximately 25 pounds over the last 3 months, and night sweats a couple days ago.  Denies having any significant fever, abdominal pain, nausea, vomiting, diarrhea, or recent sick contacts to his knowledge.  He is taking Biktarvy every day as prescribed and has not missed any doses.  He reports that he had previously injured one of his shoulders several years ago after having a fall.  About a month ago whil trying to pull the stairs down to access his mother's attic when he had acute onset of pain out of his right shoulder.  Reports he has had decreased range of motion of his right shoulder related to the symptoms.  Review of records note previous x-ray imaging done of the left shoulder.  Upon admission into the emergency department patient was noted to be afebrile with stable vital signs.  Labs significant for platelet count of 560.  CT imaging concerning for dense left-sided pneumonia, left-sided pleural effusion, lymphadenopathy, and nodular densities of the right lung which either acute concern for pneumonia or possible neoplastic process.  Case was discussed with ID.  Blood cultures have been obtained and patient was started on empiric antibiotics of Rocephin and azithromycin.  Review of Systems: As mentioned in the history  of present illness. All other systems reviewed and are negative. Past Medical History:  Diagnosis Date   Hernia, inguinal, right    HIV (human immunodeficiency virus infection) ([place])    Past Surgical History:  Procedure Laterality Date   BRONCHIAL WASHINGS  10/05/2020   Procedure: BRONCHIAL WASHINGS;  Surgeon: Julian Hy, DO;  Location: [facility];  Service: Endoscopy;;   INGUINAL HERNIA REPAIR Right 02/17/2018   Procedure: OPEN HERNIA REPAIR RIGHT INGUINAL INCARCERATED WITH DIAGONSTIC LAPAROSCOPY FOR INCARCERATED HERNIA;  Surgeon: Ileana Roup, MD;  Location: [place];  Service: General;  Laterality: Right;   VIDEO BRONCHOSCOPY N/A 10/05/2020   Procedure: VIDEO BRONCHOSCOPY WITHOUT FLUORO;  Surgeon: Julian Hy, DO;  Location: [place];  Service: Endoscopy;  Laterality: N/A;   Social History:  reports that he has been smoking cigarettes. He has been smoking an average of .3 packs per day. He has never used smokeless tobacco. He reports current alcohol use. He reports that he does not use drugs.  No Known Allergies  Family History  Problem Relation Age of Onset   Cancer Brother    Immunodeficiency Neg Hx     Prior to Admission medications   Medication Sig Start Date End Date Taking? Authorizing Provider  bictegravir-emtricitabine-tenofovir AF (BIKTARVY) 50-200-25 MG TABS tablet Take 1 tablet by mouth daily. 01/17/22   Rosiland Oz, MD    Physical Exam: Vitals:   02/05/22 1019 02/05/22 1026 02/05/22 1111 02/05/22 1313  BP: 115/79  124/83 106/69  Pulse: 82  87 92  Resp: 18  17 17  Temp: 97.8 F (36.6 C)  97.8 F (36.6 C) 97.7 F (36.5 C)  SpO2: 94%   99%  Weight:  59 kg    Height:  5\' 9"  (1.753 m)      Constitutional: Thin older adult male currently in no acute distress. Eyes: PERRL, lids and conjunctivae normal ENMT: Mucous membranes are moist.  Poor dentition with multiple missing teeth.  Neck: normal, supple Respiratory: Normal respiratory effort  with decreased overall breath sounds noted left lung field.  No significant wheezes appreciated.  O2 saturation currently maintained on room air. Cardiovascular: Regular rate and rhythm, no murmurs / rubs / gallops. No extremity edema. 2+ pedal pulses.   Abdomen: no tenderness, no masses palpated.  Bowel sounds positive.  Musculoskeletal: no clubbing / cyanosis. No joint deformity upper and lower extremities.   Skin: no rashes, lesions, ulcers. No induration Neurologic: CN 2-12 grossly intact.  Strength 5/5 in all 4.  Psychiatric: Normal judgment and insight. Alert and oriented x 3. Normal mood.   Data Reviewed:  EKG revealed normal sinus rhythm 85 bpm  Assessment and Plan: Community-acquired pneumonia Acute.  Patient presented with complaints of intermittently productive cough with dyspnea.  CT imaging notes dense left-sided pneumonia, small left pleural effusion, adenopathy, and nodular opacities of the right lung concerning for traction. -Admit to medical telemetry bed -Pneumonia order set utilized -Follow-up blood and sputum cultures -Check urine Legionella and strep -Check procalcitonin -Incentive spirometry and flutter valve every 4 hours -Continue empiric antibiotics of Rocephin and azithromycin -Mucinex -Appreciate PCCM consultative services to evaluate for possible need of bronchoscopy  Right shoulder pain Acute.  Injury occurred while pulling stairs down trying to access his mother's attic.  Do not think symptoms are related to a fracture. -Check x-ray of the right shoulder -Voltaren gel  HIV Patient followed in the outpatient setting by infectious disease on Biktarvy.  His absolute CD4 count 205 on 8/18. -Continue Biktarvy  Thrombocytosis Acute.  Count elevated at 560.  Suspect secondary to above. -Continue to monitor  Weight loss Over the last 3 months patient has lost approximately 25 pounds weight. -Check TSH  Hepatitis B Patient with prior hepatitis B  infection and appears to have immunity.  Tobacco  use Patient smokes 1/3 pack cigarettes per day on average. -Continue to counsel on need of cessation of tobacco  DVT prophylaxis: Lovenox Advance Care Planning:   Code Status: Full Code    Consults: PCCM, infectious disease  Family Communication: Family member at [phone] updated over the phone.  Severity of Illness: The appropriate patient status for this patient is INPATIENT. Inpatient status is judged to be reasonable and necessary in order to provide the required intensity of service to ensure the patient's safety. The patient's presenting symptoms, physical exam findings, and initial radiographic and laboratory data in the context of their chronic comorbidities is felt to place them at high risk for further clinical deterioration. Furthermore, it is not anticipated that the patient will be medically stable for discharge from the hospital within 2 midnights of admission.   * I certify that at the point of admission it is my clinical judgment that the patient will require inpatient hospital care spanning beyond 2 midnights from the point of admission due to high intensity of service, high risk for further deterioration and high frequency of surveillance required.*  Author: Norval Morton, MD 02/05/2022 2:51 PM  For on call review [email or web address].

## 2022-02-05 NOTE — Progress Notes (Signed)
Case dw Dr. Tamala Julian of internal medicine earlier.  Recommended MRI of shoulder to exclude infection.  At this time the concern is for neoplastic process.  No ortho surgery intervention at this time, would recommend a biopsy for tissue diagnosis.  This would need a work up with ortho oncology as the work up evolves.  That would require referral out once patient stable from medical standpoint.

## 2022-02-05 NOTE — Progress Notes (Signed)
Updated on plan. Suspected osteomyelitis of the right shoulder X-ray imaging of the right shoulder gave concern for lytic process of the glenoid favoring osteomyelitis, but septic joint could not be excluded.  -Added on ESR -ID updated in regards to this finding and we will continue current antibiotics at this time -Consulted Dr. Stann Mainland of orthopedics who recommends obtaining MRI of the shoulder without contrast.  He will follow-up imaging and determine if aspiration is warranted.

## 2022-02-05 NOTE — ED Triage Notes (Signed)
Pt reports a cough for 1 month. States has been using cough syrup and mucinex with no relief.  Also endorses right shoulder pain for a "little over a month" states the pain has been getting worse. States he went to pull an attic door and has had pain since then.

## 2022-02-05 NOTE — Discharge Instructions (Addendum)
Patient is advised to go to the emergency department for further evaluation and management.

## 2022-02-05 NOTE — Consult Note (Signed)
NAME:  Steven Johnston, MRN:  [id number], DOB:  05/28/1961, LOS: 0 ADMISSION DATE:  02/05/2022, CONSULTATION DATE:  02/05/22 REFERRING MD:  Tamala Julian CHIEF COMPLAINT:  cough, weight loss, and shortness of breath  History of Present Illness:  Patient is a 61 year old male with a past medical history of HIV on Biktarvy and Hep B who presents from urgent care for a cough and dyspnea starting 1 month ago. He is followed by Dr. West Bali at the [facility]. He was on Bactrim in December 2022 for PJP ppx which was noted to be held at this last ID clinic appointment on 01/17/22.  It was also documented at that appointment he had recently lost 25 lbs from April to August.  He reports right shoulder pain starting a month ago while working in his mother's attic.    The patient went to urgent care for worsening dyspnea and productive cough despite use of Mucinex and cough syrup on 02/05/22.  His chest x-ray revealed infiltrates in the left lobe so he was advised to come to the ED.  He denies any bloody sputum and fevers.  States he had an episode of night sweats a few nights ago.  He received IV azithromycin and rocephin in the ED.  He is currently on room air with O2 saturations ranging from 94-99% on room air.  Pertinent  Medical History  HIV  Significant Hospital Events: Including procedures, antibiotic start and stop dates in addition to other pertinent events   9/6: Pt presented to the ED, PCCM consulted for evaluation of cough, shortness of breath and weight loss  Interim History / Subjective:  Patient feels fine overall. States he has a cough with occasional sputum production that has continued for the past month with shortness of breath on exertion. He denies chest pain.   Objective   Blood pressure 106/69, pulse 92, temperature 97.7 F (36.5 C), resp. rate 17, height 5\' 9"  (1.753 m), weight 59 kg, SpO2 99 %.       No intake or output data in the 24 hours ending 02/05/22 1508 Filed Weights   02/05/22  1026  Weight: 59 kg    Examination: General: thin elderly male, alert and oriented in NAD HENT: anicteric, missing teeth, palpable cervical lymph nodes on left  Lungs: non-labored at rest, diminished breath sounds auscultated in Left lobes, clear to auscultation on right, on room air Cardiovascular: RRR, no murmurs, rubs or gallops Abdomen: Soft, non-distended abdomen, normoactive bowel sounds in all 4 quadrants Extremities: limited ROM in RUE, full ROM in all other extremities Neuro: A&O x 4, 5/5 strength in all extremities  Assessment & Plan:   Left lung Mass-like Consolidation, Nodular Densities  R Lung Pulmonary Nodule Cough/Dyspnea Left Neck Cervical Lymph Nodes  Presented with cough, dyspnea, weight loss. Radiographic findings on CT with mass like consolidation in LUL, nodular densities.  On review of prior imaging in May 2022, he did have paramediastinal left mass. Findings increased in size on recent imaging, small nodule in right as well raising concern for metastatic disease. Original concern for infectious process/pneumonia less likely given no fevers, WBC 8.7 on 9/6, no clinical PNA (sputum production etc). COVID negative. Prior studies suggest medical compliance with HRT, less likely opportunistic infection.   -IR consult for core biopsy of left cervical lymph nodes for 9/7 if time available  -if core biospy positive, send next generation sequence lab study for genetics  -pulmonary hygiene - IS, mobilize -may need additional FOB with  NAV pending tissues / genetic needs if positive  -NPO after MN -smoking cessation  -follow intermittent imaging  HIV -defer Biktarvy to TRH -CD4 was 205 on 8/18  Unintentional Weight Loss Patient has lost over 25 lbs in the last 3 months, concern for malignancy  -Nutrition consult  -diet as tolerated   Nicotine Use Patient currently smokes 5 cigarettes per day -Nicotine patch -Smoking cessation counseling   Best Practice (right  click and "Reselect all SmartList Selections" daily)   Per primary  Labs   CBC: Recent Labs  Lab 02/05/22 1119  WBC 8.7  NEUTROABS 6.3  HGB 13.0  HCT 41.8  MCV 82.9  PLT 560*    Basic Metabolic Panel: Recent Labs  Lab 02/05/22 1119  NA 140  K 4.0  CL 104  CO2 26  GLUCOSE 93  BUN 16  CREATININE 0.98  CALCIUM 9.0   GFR: Estimated Creatinine Clearance: 66.1 mL/min (by C-G formula based on SCr of 0.98 mg/dL). Recent Labs  Lab 02/05/22 1119  WBC 8.7    Liver Function Tests: Recent Labs  Lab 02/05/22 1119  AST 18  ALT 13  ALKPHOS 62  BILITOT 0.4  PROT 7.2  ALBUMIN 2.6*   No results for input(s): "LIPASE", "AMYLASE" in the last 168 hours. No results for input(s): "AMMONIA" in the last 168 hours.  ABG    Component Value Date/Time   PHART 7.420 10/01/2020 1155   PCO2ART 32.8 10/01/2020 1155   PO2ART 53.4 (L) 10/01/2020 1155   HCO3 20.8 10/01/2020 1155   TCO2 27 02/17/2018 1324   ACIDBASEDEF 2.9 (H) 10/01/2020 1155   O2SAT 85.5 10/01/2020 1155     Coagulation Profile: No results for input(s): "INR", "PROTIME" in the last 168 hours.  Cardiac Enzymes: No results for input(s): "CKTOTAL", "CKMB", "CKMBINDEX", "TROPONINI" in the last 168 hours.  HbA1C: Hgb A1c MFr Bld  Date/Time Value Ref Range Status  01/21/2021 10:32 AM 5.5 <5.7 % of total Hgb Final    Comment:    For the purpose of screening for the presence of diabetes: . <5.7%       Consistent with the absence of diabetes 5.7-6.4%    Consistent with increased risk for diabetes             (prediabetes) > or =6.5%  Consistent with diabetes . This assay result is consistent with a decreased risk of diabetes. . Currently, no consensus exists regarding use of hemoglobin A1c for diagnosis of diabetes in children. . According to American Diabetes Association (ADA) guidelines, hemoglobin A1c <7.0% represents optimal control in non-pregnant diabetic patients. Different metrics may apply to  specific patient populations.  Standards of Medical Care in Diabetes(ADA). .     CBG: No results for input(s): "GLUCAP" in the last 168 hours.  Review of Systems:   Review of Systems  Constitutional:  Positive for weight loss. Negative for fever.  HENT: Negative.    Eyes: Negative.   Respiratory:  Positive for cough, sputum production and shortness of breath.   Cardiovascular: Negative.   Gastrointestinal: Negative.   Genitourinary: Negative.   Musculoskeletal:  Positive for joint pain.       In RUE  Skin: Negative.   Neurological: Negative.   Psychiatric/Behavioral: Negative.     Past Medical History:  He,  has a past medical history of Hernia, inguinal, right and HIV (human immunodeficiency virus infection) ([place]).   Surgical History:   Past Surgical History:  Procedure Laterality Date   BRONCHIAL WASHINGS  10/05/2020   Procedure: BRONCHIAL WASHINGS;  Surgeon: Julian Hy, DO;  Location: [facility];  Service: Endoscopy;;   INGUINAL HERNIA REPAIR Right 02/17/2018   Procedure: OPEN HERNIA REPAIR RIGHT INGUINAL INCARCERATED WITH DIAGONSTIC LAPAROSCOPY FOR INCARCERATED HERNIA;  Surgeon: Ileana Roup, MD;  Location: [facility];  Service: General;  Laterality: Right;   VIDEO BRONCHOSCOPY N/A 10/05/2020   Procedure: VIDEO BRONCHOSCOPY WITHOUT FLUORO;  Surgeon: Julian Hy, DO;  Location: [facility];  Service: Endoscopy;  Laterality: N/A;     Social History:   reports that he has been smoking cigarettes. He has been smoking an average of .3 packs per day. He has never used smokeless tobacco. He reports current alcohol use. He reports that he does not use drugs.   Family History:  His family history includes Cancer in his brother. There is no history of Immunodeficiency.   Allergies No Known Allergies   Home Medications  Prior to Admission medications   Medication Sig Start Date End Date Taking? Authorizing Provider  bictegravir-emtricitabine-tenofovir AF (BIKTARVY)  50-200-25 MG TABS tablet Take 1 tablet by mouth daily. 01/17/22  Yes Rosiland Oz, MD     Critical care time: n/a     Daisy Blossom, AGACNP-S [place], MSN, APRN, NP-C, AGACNP-BC [facility] 02/05/2022, 5:31 PM   Please see [email or web address] for pager details.   From 7A-7P if no response, please call [phone] After hours, please call ELink [phone]

## 2022-02-05 NOTE — ED Notes (Signed)
Pt provided with specimen cup for sputum sample

## 2022-02-05 NOTE — ED Triage Notes (Signed)
Pt endorses coughing a lot and having right shoulder pain for a month. Pt had Xray at [facility] that was worried about PNA. Hx of HIV.

## 2022-02-05 NOTE — Consult Note (Addendum)
[facility] for Infectious Disease    Date of Admission:  02/05/2022     Reason for Consult: HIV, pneumonia     Referring Physician: ED Dr Jeanell Sparrow  Current antibiotics: Ceftriaxone Azithromycin Biktarvy   ASSESSMENT:    61 y.o. male admitted with:  Abnormal chest imaging: CT scan today raises possibility of pneumonia vs underlying malignancy, particularly with unintended weight loss and lymphadenopathy.  He does not have symptoms particularly consistent with pneumonia given weeks of dry to mildly productive cough and exertional dyspnea.  Would query underlying COPD contributing to constellation of symptoms.  He has no fevers or other systemic symptoms.  Less suspicion for PCP pneumonia given improved CD 4 count and suppressed viral load.  Additionally he was on Bactrim for prophylaxis until ~ June at which point his CD 4 count had been above 200 for several months.  He had bronchoscopy in May 2022 at which point work up revealed negative AFB smear/cultures and negative cytology.  HIV disease: Currently well controlled on Biktarvy.  Right shoulder pain: He reports prior shoulder injury several years ago but more recently as been worsened with decreased ROM.  X-ray is showing a lytic lesion in the right glenoid favoring OM given concern for left sided pneumonia and possible glenohumeral septic arthritis.   RECOMMENDATIONS:    Continue ceftriaxone and azithromycin for CAP coverage Continue Biktarvy Follow up strep and legionella urine Ag Suspicion is less so for PCP or other pneumonia and would have more concern for underlying malignancy given his overall presentation.  Recommend pulmonary consult to consider bronchoscopy, possible biopsies If he does have bronchoscopy could send PCP smear by DFA, AFB/Fungal/Bacterial cultures, cytology/pathology to evaluate for atypical infection in addition to malignancy Await orthopedic opinion regarding shoulder x-ray to see what further imaging they  recommend vs attempted joint space aspiration.  For now will not expand antibiotics pending possible cultures.  If he does have sampling and there is concern for septic joint, then could add vancomycin empirically pending those culture results Following   Principal Problem:   Community acquired pneumonia Active Problems:   HIV disease ([place])   Smoking   MEDICATIONS:    Scheduled Meds:  [START ON 02/06/2022] azithromycin  500 mg Oral Daily   diclofenac Sodium  2 g Topical QID   enoxaparin (LOVENOX) injection  40 mg Subcutaneous Q24H   guaiFENesin  600 mg Oral BID   sodium chloride flush  3 mL Intravenous Q12H   Continuous Infusions:  cefTRIAXone (ROCEPHIN)  IV 1 g (02/05/22 1558)   [START ON 02/06/2022] cefTRIAXone (ROCEPHIN)  IV     PRN Meds:.acetaminophen **OR** acetaminophen, albuterol, ondansetron **OR** ondansetron (ZOFRAN) IV  HPI:    Arieh Bogue is a 60 y.o. male with PMHx of HIV, tobacco use, prior PCP pneumonia who presented to the ED today primarily with respiratory complaints.    He was seen at urgent care where x-ray showed heterogenous airspace opacification of the mid to upper lung with diffuse left lung interstitial thickening suspicious for pneumonia.  He was sent to the ED.  CT scan was obtained and this showed a large infiltrate in the left upper lobe suggesting pneumonia and areas of homogenous opacity without aeration in the left upper lobe.  Underlying malignant neoplastic processes was not excluded.  Extensive patchy infiltrates were seen in the left lower lobe.  Patient also had enlarged lymph nodes in the mediastinum and hilar regions suggesting infectious or neoplastic process.  Also noted were a  few nodular densities in the right lower lobe largest measuring 6mm in size with spiculated margins, again suggesting foci of pneumonia or neoplastic process.    Patient reports several weeks of cough occassionally productive of yellowish-clear sputum and exertional  dyspnea.  He has not had any fevers or chills.  He was using an albuterol inhaler as needed but ran out of this refill in June.  He also stopped taking Bactrim for PCP prophylaxis around that time as well.  He has a history of presumed PCP pneumonia in May 2022 (treated with 21 days Bactrim and prednisone).  He was seen on 01/17/22 in the ID clinic by Dr West Bali and reported continued tobacco use and weight loss but no other pulmonary complaints at that visit were documented.  He also reported decreased range of motion with his shoulder since early July.  His weight that day was 129#.  Weight in April was 146#. Weight in December was 152#.  He has been having a good appetite and eating about 2 times per day.  Patients CD4 count in December 2022 was up to 237 and has remained above 200 since that time with most recent value of 205 on 01/17/22 at which point his viral load was less than 20.  His viral load has been under 200 since last August 2022.    Past Medical History:  Diagnosis Date   Hernia, inguinal, right    HIV (human immunodeficiency virus infection) ([place])     Social History   Tobacco Use   Smoking status: Every Day    Packs/day: 0.30    Types: Cigarettes   Smokeless tobacco: Never  Substance Use Topics   Alcohol use: Yes    Comment: for celebrations   Drug use: No    Family History  Problem Relation Age of Onset   Cancer Brother    Immunodeficiency Neg Hx     No Known Allergies  Review of Systems  All other systems reviewed and are negative.  Except as noted above in the HPI.   OBJECTIVE:   Blood pressure 106/69, pulse 92, temperature 97.7 F (36.5 C), resp. rate 17, height 5\' 9"  (1.753 m), weight 59 kg, SpO2 99 %. Body mass index is 19.21 kg/m.  Physical Exam Constitutional:      General: He is not in acute distress.    Appearance: He is not toxic-appearing.     Comments: Thin appearing man, lying in ED.  HENT:     Head: Normocephalic and atraumatic.  Eyes:      Extraocular Movements: Extraocular movements intact.     Conjunctiva/sclera: Conjunctivae normal.  Cardiovascular:     Rate and Rhythm: Normal rate and regular rhythm.  Pulmonary:     Effort: Pulmonary effort is normal. No respiratory distress.     Comments: He is on room air.  Abdominal:     General: There is no distension.     Palpations: Abdomen is soft.  Musculoskeletal:        General: Normal range of motion.     Cervical back: Normal range of motion and neck supple.     Right lower leg: No edema.     Left lower leg: No edema.  Skin:    General: Skin is warm and dry.     Findings: No rash.  Neurological:     General: No focal deficit present.     Mental Status: He is alert and oriented to person, place, and time.  Psychiatric:  Mood and Affect: Mood normal.        Behavior: Behavior normal.      Lab Results: Lab Results  Component Value Date   WBC 8.7 02/05/2022   HGB 13.0 02/05/2022   HCT 41.8 02/05/2022   MCV 82.9 02/05/2022   PLT 560 (H) 02/05/2022    Lab Results  Component Value Date   NA 140 02/05/2022   K 4.0 02/05/2022   CO2 26 02/05/2022   GLUCOSE 93 02/05/2022   BUN 16 02/05/2022   CREATININE 0.98 02/05/2022   CALCIUM 9.0 02/05/2022   GFRNONAA >60 02/05/2022   GFRAA >60 02/18/2018    Lab Results  Component Value Date   ALT 13 02/05/2022   AST 18 02/05/2022   ALKPHOS 62 02/05/2022   BILITOT 0.4 02/05/2022    No results found for: "CRP"  No results found for: "ESRSEDRATE"  I have reviewed the micro and lab results in Epic.  Imaging: CT CHEST W CONTRAST  Result Date: 02/05/2022 CLINICAL DATA:  Pneumonia EXAM: CT CHEST WITH CONTRAST TECHNIQUE: Multidetector CT imaging of the chest was performed during intravenous contrast administration. RADIATION DOSE REDUCTION: This exam was performed according to the departmental dose-optimization program which includes automated exposure control, adjustment of the mA and/or kV according to  patient size and/or use of iterative reconstruction technique. CONTRAST:  59mL OMNIPAQUE IOHEXOL 350 MG/ML SOLN COMPARISON:  Previous CT done on 10/01/2020 and chest radiographs done today FINDINGS: Cardiovascular: There is homogeneous enhancement in thoracic aorta. There are no intraluminal filling defects in central pulmonary artery branches. Mediastinum/Nodes: There are enlarged lymph nodes in mediastinum and hilar regions. There is a precarinal node measuring 1.3 cm in short axis. There is subcarinal node measuring 1.4 cm in short axis. There is 2 cm node in the inferior right hilum. Evaluation of left hilum for lymphadenopathy is limited by dense adjacent infiltrates. Lungs/Pleura: There is large dense infiltrate in left upper lobe. There is 7 cm slightly inhomogeneous attenuation in the left apex. There is 5.5 cm density in the anteromedial left upper lobe. There is a 3.9 cm opacity in the left parahilar region and left lower lobe. There are other extensive patchy infiltrates in left upper lobe and left lower lobe. There is interval clearing of ground-glass densities in right lung. In image 116 of series 4, there is a new 1 cm nodule with spiculated margins in right lower lobe. In [place] [facility], there is 7 mm pleural-based density in posterior right lower lung field. In image 139, there is 6 mm nodular density in the posterior right lower lobe. Small left pleural effusion is seen. There is no pneumothorax. Blebs and bullae are seen in the right apex. Upper Abdomen: There is fatty infiltration of the liver. Musculoskeletal: No focal lytic or sclerotic lesions are seen. IMPRESSION: There is interval appearance of large infiltrate in left upper lobe suggesting pneumonia. There are areas of homogeneous opacity without aeration in left upper lobe. Possibility of underlying malignant neoplastic processes not excluded. Extensive patchy infiltrates are seen in left lower lobe including 3.9 cm homogeneous  opacity in the inferior left hilum. Small left pleural effusion is seen. There are enlarged lymph nodes in mediastinum and hilar regions suggesting infectious or neoplastic process. There are few nodular densities in right lower lobe largest measuring 10 mm in size with spiculated margins. This finding may suggest small foci of pneumonia or neoplastic process. Short-term follow-up CT after antibiotic treatment along with PET-CT if warranted should be  considered. Electronically Signed   By: Elmer Picker M.D.   On: 02/05/2022 14:29   DG Chest 2 View  Result Date: 02/05/2022 CLINICAL DATA:  Chronic for 1 month.  Right shoulder pain. EXAM: CHEST - 2 VIEW COMPARISON:  Chest two views 09/30/2020, CT chest 10/01/2020 FINDINGS: There is mild high-grade heterogeneous airspace opacification throughout the mid and superior aspects of the left lung with diffuse left lung interstitial thickening also extending into the inferior left lung. More homogeneous opacification of the left lung apex, possible pleural fluid versus airspace opacity. The right lung is clear. No inferior layering pleural effusion is seen. No pneumothorax. The cardiac silhouette is partially obscured but does not appear enlarged. Mediastinal contours are not well evaluated. No acute skeletal abnormality. IMPRESSION: Heterogeneous airspace opacification of the mid to upper left lung with diffuse left lung interstitial thickening. Findings are suspicious for high-grade pneumonia. Note is made on prior radiographs and CT there was clinical concern for ground-glass, multifocal atypical pneumonia. Is this patient immunocompromised? Electronically Signed   By: Yvonne Kendall M.D.   On: 02/05/2022 09:50     Imaging independently reviewed in Epic.  [facility] [phone] pager 02/05/2022, 4:05 PM  I have personally spent 80 minutes involved in face-to-face and non-face-to-face  activities for this patient on the day of the visit. Professional time spent includes the following activities: Preparing to see the patient (review of tests), Obtaining and/or reviewing separately obtained history (admission/discharge record), Performing a medically appropriate examination and/or evaluation , Ordering medications/tests/procedures, referring and communicating with other health care professionals, Documenting clinical information in the EMR, Independently interpreting results (not separately reported), Communicating results to the patient/family/caregiver, Counseling and educating the patient/family/caregiver and Care coordination (not separately reported).

## 2022-02-06 ENCOUNTER — Inpatient Hospital Stay (HOSPITAL_COMMUNITY): Payer: Commercial Managed Care - HMO

## 2022-02-06 DIAGNOSIS — R599 Enlarged lymph nodes, unspecified: Secondary | ICD-10-CM | POA: Diagnosis not present

## 2022-02-06 DIAGNOSIS — C799 Secondary malignant neoplasm of unspecified site: Secondary | ICD-10-CM

## 2022-02-06 DIAGNOSIS — B20 Human immunodeficiency virus [HIV] disease: Secondary | ICD-10-CM | POA: Diagnosis not present

## 2022-02-06 DIAGNOSIS — R918 Other nonspecific abnormal finding of lung field: Secondary | ICD-10-CM | POA: Diagnosis not present

## 2022-02-06 DIAGNOSIS — R634 Abnormal weight loss: Secondary | ICD-10-CM | POA: Diagnosis not present

## 2022-02-06 LAB — CBC
HCT: 35.6 % — ABNORMAL LOW (ref 39.0–52.0)
Hemoglobin: 11.4 g/dL — ABNORMAL LOW (ref 13.0–17.0)
MCH: 26.1 pg (ref 26.0–34.0)
MCHC: 32 g/dL (ref 30.0–36.0)
MCV: 81.5 fL (ref 80.0–100.0)
Platelets: 476 10*3/uL — ABNORMAL HIGH (ref 150–400)
RBC: 4.37 MIL/uL (ref 4.22–5.81)
RDW: 14.3 % (ref 11.5–15.5)
WBC: 8.9 10*3/uL (ref 4.0–10.5)
nRBC: 0 % (ref 0.0–0.2)

## 2022-02-06 LAB — BASIC METABOLIC PANEL
Anion gap: 7 (ref 5–15)
BUN: 16 mg/dL (ref 8–23)
CO2: 23 mmol/L (ref 22–32)
Calcium: 8 mg/dL — ABNORMAL LOW (ref 8.9–10.3)
Chloride: 107 mmol/L (ref 98–111)
Creatinine, Ser: 0.93 mg/dL (ref 0.61–1.24)
GFR, Estimated: >60 mL/min (ref >60)
Glucose, Bld: 79 mg/dL (ref 70–99)
Potassium: 4.4 mmol/L (ref 3.5–5.1)
Sodium: 137 mmol/L (ref 135–145)

## 2022-02-06 LAB — RAPID URINE DRUG SCREEN, HOSP PERFORMED
Amphetamines: NOT DETECTED
Barbiturates: NOT DETECTED
Benzodiazepines: NOT DETECTED
Cocaine: POSITIVE — AB
Opiates: POSITIVE — AB
Tetrahydrocannabinol: NOT DETECTED

## 2022-02-06 LAB — PROCALCITONIN: Procalcitonin: <0.1 ng/mL

## 2022-02-06 MED ORDER — DICLOFENAC SODIUM 1 % EX GEL: 2.0000 g | Freq: Four times a day (QID) | CUTANEOUS | Status: DC

## 2022-02-06 MED ORDER — LIDOCAINE-EPINEPHRINE 1 %-1:100000 IJ SOLN
INTRAMUSCULAR | Status: AC
  Filled 2022-02-06: qty 1

## 2022-02-06 NOTE — Assessment & Plan Note (Addendum)
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

## 2022-02-06 NOTE — ED Provider Notes (Signed)
[facility]    CSN: [id number] Arrival date & time: 02/05/22  0809      History   Chief Complaint Chief Complaint  Patient presents with   Cough   Shoulder Pain    HPI Steven Johnston is a 61 y.o. male comes to the urgent care with 1 month history of a cough that is productive of greenish/yellowish sputum.  Patient endorses insidious onset of symptoms which has progressed over the past month.  He denies any blood in his sputum.  He has subjective subjective fever but no chills.  He is short of breath and his exercise tolerance has decreased significantly over the past month.  No dizziness, near syncope or syncopal episode.  No sick contacts.  Patient has a history of HIV currently on Biktarvy.  His last ANC was 205.  Patient endorses 10 pound weight loss over the past month.  He denies night sweats.  Patient complains of right shoulder pain of about 4 to 6 weeks duration.  It started after he pulled down an attic door.  No deformity of the shoulder.  No radiation of pain into the hand.  No weakness in the right upper extremity.   HPI  Past Medical History:  Diagnosis Date   Hernia, inguinal, right    HIV (human immunodeficiency virus infection) ([place])     Patient Active Problem List   Diagnosis Date Noted   Pneumonia of left lung due to infectious organism 02/05/2022   Thrombocytosis 02/05/2022   Weight loss 02/05/2022   Lung mass    Right shoulder pain 01/18/2022   Hepatitis B infection without delta agent without hepatic coma 01/18/2022   Cancer screening 09/10/2021   Need for immunization against influenza 05/03/2021   Protein-calorie malnutrition ([place]) 05/03/2021   Tobacco use 05/03/2021   Immunization counseling 01/21/2021   Screening for STDs (sexually transmitted diseases) 10/16/2020   Dental caries 10/16/2020   Unspecified severe protein-calorie malnutrition ([place])    Bilateral interstitial pneumonia ([place])    Bilateral pneumonia 09/30/2020   HIV disease  ([place]) 09/30/2020    Past Surgical History:  Procedure Laterality Date   BRONCHIAL WASHINGS  10/05/2020   Procedure: BRONCHIAL WASHINGS;  Surgeon: Julian Hy, DO;  Location: [facility];  Service: Endoscopy;;   INGUINAL HERNIA REPAIR Right 02/17/2018   Procedure: OPEN HERNIA REPAIR RIGHT INGUINAL INCARCERATED WITH DIAGONSTIC LAPAROSCOPY FOR INCARCERATED HERNIA;  Surgeon: Ileana Roup, MD;  Location: [facility];  Service: General;  Laterality: Right;   VIDEO BRONCHOSCOPY N/A 10/05/2020   Procedure: VIDEO BRONCHOSCOPY WITHOUT FLUORO;  Surgeon: Julian Hy, DO;  Location: [facility];  Service: Endoscopy;  Laterality: N/A;       Home Medications    Prior to Admission medications   Medication Sig Start Date End Date Taking? Authorizing Provider  bictegravir-emtricitabine-tenofovir AF (BIKTARVY) 50-200-25 MG TABS tablet Take 1 tablet by mouth daily. 01/17/22   Rosiland Oz, MD    Family History Family History  Problem Relation Age of Onset   Cancer Brother    Immunodeficiency Neg Hx     Social History Social History   Tobacco Use   Smoking status: Every Day    Packs/day: 0.30    Types: Cigarettes   Smokeless tobacco: Never  Substance Use Topics   Alcohol use: Yes    Comment: for celebrations   Drug use: No     Allergies   Patient has no known allergies.   Review of Systems Review of Systems  Constitutional:  Negative.   HENT:  Positive for congestion.   Respiratory:  Positive for cough and shortness of breath. Negative for choking, chest tightness and wheezing.   Cardiovascular:  Positive for chest pain.  Gastrointestinal: Negative.   Neurological: Negative.      Physical Exam Triage Vital Signs ED Triage Vitals  Enc Vitals Group     BP 02/05/22 0858 111/74     Pulse Rate 02/05/22 0858 85     Resp 02/05/22 0858 18     Temp 02/05/22 0858 (!) 97.5 F (36.4 C)     Temp Source 02/05/22 0858 Oral     SpO2 02/05/22 0858 91 %     Weight --       Height --      Head Circumference --      Peak Flow --      Pain Score 02/05/22 0857 9     Pain Loc --      Pain Edu? --      Excl. in [place]? --    No data found.  Updated Vital Signs BP 111/74 (BP Location: Left Arm)   Pulse 85   Temp (!) 97.5 F (36.4 C) (Oral)   Resp 18   SpO2 91%   Visual Acuity Right Eye Distance:   Left Eye Distance:   Bilateral Distance:    Right Eye Near:   Left Eye Near:    Bilateral Near:     Physical Exam Vitals and nursing note reviewed.  Constitutional:      General: He is not in acute distress.    Appearance: He is ill-appearing.     Comments: Cachectic looking patient  Cardiovascular:     Rate and Rhythm: Normal rate and regular rhythm.  Pulmonary:     Effort: Respiratory distress present.     Breath sounds: Rhonchi and rales present.     Comments: Decreased air entry in the left lung.  Rales and rhonchi in the left lung.  No expiratory wheezing. Neurological:     Mental Status: He is alert.      UC Treatments / Results  Labs (all labs ordered are listed, but only abnormal results are displayed) Labs Reviewed  CBC WITH DIFFERENTIAL/PLATELET - Abnormal; Notable for the following components:      Result Value   MCH 25.8 (*)    Platelets 560 (*)    All other components within normal limits  COMPREHENSIVE METABOLIC PANEL - Abnormal; Notable for the following components:   Albumin 2.6 (*)    All other components within normal limits    EKG   Radiology  Procedures Procedures (including critical care time)  Medications Ordered in UC Medications - No data to display  Initial Impression / Assessment and Plan / UC Course  I have reviewed the triage vital signs and the nursing notes.  Pertinent labs & imaging results that were available during my care of the patient were reviewed by me and considered in my medical decision making (see chart for details).     1.  Left lobar pneumonia: Chest x-ray is significant for  infiltrate in the left lung concerning for high-grade pneumonia Patient is advised to go to the emergency department for further work-up.  Patient may have an underlying malignancy given the nature of the x-ray.  Patient agrees with the plan.  Patient will require hospitalization for further work-up  2.  COPD with acute exacerbation: Patient is advised to go to the emergency department for further evaluation.  Final Clinical Impressions(s) / UC Diagnoses   Final diagnoses:  Bacterial lobar pneumonia  COPD exacerbation [facility])     Discharge Instructions      Patient is advised to go to the emergency department for further evaluation and management.   ED Prescriptions   None    PDMP not reviewed this encounter.   Chase Picket, MD 02/06/22 1131

## 2022-02-06 NOTE — Assessment & Plan Note (Signed)
Likely due to above

## 2022-02-06 NOTE — Hospital Course (Signed)
Caylin Raby is Joia Doyle 61 y.o. 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  His cough has been intermittently productive with yellowish to greenish sputum.  Associated symptoms include shortness of with activity, weight loss of approximately 25 pounds over the last 3 months, and night sweats Ellana Kawa couple days ago.  Denies having any significant fever, abdominal pain, nausea, vomiting, diarrhea, or recent sick contacts to his knowledge.  He is taking Biktarvy every day as prescribed and has not missed any doses.  He reports that he had previously injured one of his shoulders several years ago after having Natalio Salois fall.  About [place] month ago whil trying to pull the stairs down to access his mother's attic when he had acute onset of pain out of his right shoulder.  Reports he has had decreased range of motion of his right shoulder related to the symptoms.  Review of records note previous x-ray imaging done of the left shoulder.   Upon admission into the emergency department patient was noted to be afebrile with stable vital signs.  Labs significant for platelet count of 560.  CT imaging concerning for dense left-sided pneumonia, left-sided pleural effusion, lymphadenopathy, and nodular densities of the right lung which either acute concern for pneumonia or possible neoplastic process.  Case was discussed with ID.  Blood cultures have been obtained and patient was started on empiric antibiotics of Rocephin and azithromycin.

## 2022-02-06 NOTE — Assessment & Plan Note (Signed)
Encpirage cessation

## 2022-02-06 NOTE — Assessment & Plan Note (Signed)
biktarvy

## 2022-02-06 NOTE — Procedures (Signed)
Pre Procedure Dx: Concern for metastatic lung cancer Post Procedural Dx: Same  Technically successful US guided biopsy of left supraclavicular nodal conglomeration.   EBL: None  No immediate complications.   Ronny Bacon, MD Pager #: [phone]

## 2022-02-06 NOTE — Assessment & Plan Note (Addendum)
Presentation at this time concerning for metastatic cancer  CT chest 9/6 notable for large infiltrate in left upper lobe, areas of homogenous opacity without aeration in left upper lobe, extensive patchy infiltrates seen in LLL including 3.9 cm homgenous opacity in the inferior left hilum, small L effusion, enlarged lymph nodes in mediastinum and hilar regions, few nodular densities in right lower lobe, larges measuring 10 mm in size with spiculated margins.  3 cm supraclavicular lymph nodue suggesting metastatic disease.  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  1.5 cm pleural based noncalcified nodule in medial left upper lobe.  Findings in left lung suggest extensive malignant neoplastic process with lymphangitic spread of metastatic disease throughout left lung.  Nodular densities in R lung suggest metastatic disease.  Enlarged LN's in mediastinum suggest metastatic LAD.  Now s/p IR US guided bx of L supraclavicular nodal conglomeration  Follow pending bx results Appreciate pulm recs Low suspicion for bacterial pneumonia, stop abx ID planning to follow up and can help coordinate follow up, appreciate assistance

## 2022-02-06 NOTE — Progress Notes (Signed)
PROGRESS NOTE    Steven Johnston  MGQ:[id number] DOB: 17-Jul-1960 DOA: 02/05/2022 PCP: Patient, No Pcp Per  Chief Complaint  Patient presents with   Cough   Shoulder Pain    Brief Narrative:  Steven Johnston is Steven Johnston 61 y.o. 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  His cough has been intermittently productive with yellowish to greenish sputum.  Associated symptoms include shortness of with activity, weight loss of approximately 25 pounds over the last 3 months, and night sweats Steven Johnston couple days ago.  Denies having any significant fever, abdominal pain, nausea, vomiting, diarrhea, or recent sick contacts to his knowledge.  He is taking Biktarvy every day as prescribed and has not missed any doses.  He reports that he had previously injured one of his shoulders several years ago after having Steven Johnston fall.  About Steven Johnston month ago whil trying to pull the stairs down to access his mother's attic when he had acute onset of pain out of his right shoulder.  Reports he has had decreased range of motion of his right shoulder related to the symptoms.  Review of records note previous x-ray imaging done of the left shoulder.   Upon admission into the emergency department patient was noted to be afebrile with stable vital signs.  Labs significant for platelet count of 560.  CT imaging concerning for dense left-sided pneumonia, left-sided pleural effusion, lymphadenopathy, and nodular densities of the right lung which either acute concern for pneumonia or possible neoplastic process.  Case was discussed with ID.  Blood cultures have been obtained and patient was started on empiric antibiotics of Rocephin and azithromycin.    Assessment & Plan:   Principal Problem:   HIV disease ([place]) Active Problems:   Pneumonia of left lung due to infectious organism    Metastatic disease ([place])   Right shoulder pain   Thrombocytosis   Tobacco use   Weight loss   Assessment and Plan: * HIV disease ([place]) biktarvy  Metastatic disease ([place]) Presentation at this time concerning for metastatic cancer  CT chest 9/6 notable for large infiltrate in left upper lobe, areas of homogenous opacity without aeration in left upper lobe, extensive patchy infiltrates seen in LLL including 3.9 cm homgenous opacity in the inferior left hilum, small L effusion, enlarged lymph nodes in mediastinum and hilar regions, few nodular densities in right lower lobe, larges measuring 10 mm in size with spiculated margins.  3 cm supraclavicular lymph nodue suggesting metastatic disease.  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  1.5 cm pleural based noncalcified nodule in medial left upper lobe.  Findings in left lung suggest extensive malignant neoplastic process with lymphangitic spread of metastatic disease throughout left lung.  Nodular densities in R lung suggest metastatic disease.  Enlarged LN's in mediastinum suggest metastatic LAD.  Now s/p IR US guided bx of L supraclavicular nodal conglomeration  Follow pending bx results Appreciate pulm recs Low suspicion for bacterial pneumonia, stop abx ID planning to follow up and can help coordinate follow up, appreciate assistance     Right shoulder pain MRI showing destructive scapular bone lesion with associated large soft tissue mass measuring approximately 5x4.5 cm -> likely neoplastic process Ortho not recommending any intervention  Thrombocytosis Likely due to above  Tobacco use Encpirage cessation  DVT prophylaxis: lovenox Code Status: full Family Communication: none Disposition:   Status is: Inpatient Remains inpatient appropriate because: need for continued inpatient care   Consultants:  IR Pulm id  Procedures:  IMPRESSION: Technically successful ultrasound  guided biopsy of dominant left supraclavicular nodal conglomeration lymph node.  Antimicrobials:  Anti-infectives (From admission, onward)    Start     Dose/Rate Route Frequency Ordered Stop   02/06/22 1000  cefTRIAXone (ROCEPHIN) 2 g in sodium chloride 0.9 % 100 mL IVPB  Status:  Discontinued        2 g 200 mL/hr over 30 Minutes Intravenous Every 24 hours 02/05/22 1504 02/06/22 1028   02/06/22 1000  azithromycin (ZITHROMAX) tablet 500 mg  Status:  Discontinued        500 mg Oral Daily 02/05/22 1504 02/06/22 1028   02/06/22 1000  bictegravir-emtricitabine-tenofovir AF (BIKTARVY) 50-200-25 MG per tablet 1 tablet        1 tablet Oral Daily 02/05/22 1609     02/05/22 1500  cefTRIAXone (ROCEPHIN) 1 g in sodium chloride 0.9 % 100 mL IVPB        1 g 200 mL/hr over 30 Minutes Intravenous  Once 02/05/22 1458 02/05/22 1650   02/05/22 1230  cefTRIAXone (ROCEPHIN) 1 g in sodium chloride 0.9 % 100 mL IVPB        1 g 200 mL/hr over 30 Minutes Intravenous  Once 02/05/22 1221 02/05/22 1415   02/05/22 1230  azithromycin (ZITHROMAX) 500 mg in sodium chloride 0.9 % 250 mL IVPB        500 mg 250 mL/hr over 60 Minutes Intravenous  Once 02/05/22 1221 02/05/22 1550       Subjective: No new complaints  Objective: Vitals:   02/06/22 0600 02/06/22 1027 02/06/22 1501 02/06/22 1637  BP: 110/81 110/77 104/64 111/76  Pulse: 85 89 95 97  Resp: (!) 23 (!) 94 19 (!) 22  Temp:  97.7 F (36.5 C) 98.3 F (36.8 C) 97.7 F (36.5 C)  TempSrc:  Oral Oral Oral  SpO2: 97% 95% 96% 94%  Weight:      Height:        Intake/Output Summary (Last 24 hours) at 02/06/2022 1842 Last data filed at 02/06/2022 0456 Gross per 24 hour  Intake --  Output 400 ml  Net -400 ml   Filed Weights   02/05/22 1026  Weight: 59 kg    Examination:  General exam: Appears calm and comfortable  Respiratory system: unlabored Cardiovascular system: RRR Gastrointestinal system: Abdomen is nondistended, soft and nontende Central  nervous system: Alert and oriented. No focal neurological deficits. Extremities:no lee    Data Reviewed: I have personally reviewed following labs and imaging studies  CBC: Recent Labs  Lab 02/05/22 1119 02/06/22 0258  WBC 8.7 8.9  NEUTROABS 6.3  --   HGB 13.0 11.4*  HCT 41.8 35.6*  MCV 82.9 81.5  PLT 560* 476*    Basic Metabolic Panel: Recent Labs  Lab 02/05/22 1119 02/06/22 0258  NA 140 137  K 4.0 4.4  CL 104 107  CO2 26 23  GLUCOSE 93 79  BUN 16 16  CREATININE 0.98 0.93  CALCIUM 9.0 8.0*    GFR: Estimated Creatinine Clearance: 69.6 mL/min (by C-G formula based on SCr of 0.93 mg/dL).  Liver Function Tests: Recent Labs  Lab 02/05/22 1119  AST 18  ALT 13  ALKPHOS 62  BILITOT 0.4  PROT 7.2  ALBUMIN 2.6*    CBG: No results for input(s): "  GLUCAP" in the last 168 hours.   Recent Results (from the past 240 hour(s))  Blood culture (routine x 2)     Status: None (Preliminary result)   Collection Time: 02/05/22 10:57 AM   Specimen: BLOOD  Result Value Ref Range Status   Specimen Description BLOOD SITE NOT SPECIFIED  Final   Special Requests   Final    BOTTLES DRAWN AEROBIC AND ANAEROBIC Blood Culture adequate volume   Culture   Final    NO GROWTH < 24 HOURS Performed at [facility], [address] [place], [place] [address]    Report Status PENDING  Incomplete  Blood culture (routine x 2)     Status: None (Preliminary result)   Collection Time: 02/05/22 11:02 AM   Specimen: BLOOD  Result Value Ref Range Status   Specimen Description BLOOD RIGHT ANTECUBITAL  Final   Special Requests   Final    BOTTLES DRAWN AEROBIC AND ANAEROBIC Blood Culture results may not be optimal due to an inadequate volume of blood received in culture bottles   Culture   Final    NO GROWTH < 24 HOURS Performed at [facility], [place] [address], [place], [place] [address]    Report Status PENDING  Incomplete  SARS Coronavirus 2 by RT PCR (hospital order, performed  in [facility] hospital lab) *cepheid single result test* Anterior Nasal Swab     Status: None   Collection Time: 02/05/22 12:41 PM   Specimen: Anterior Nasal Swab  Result Value Ref Range Status   SARS Coronavirus 2 by RT PCR NEGATIVE NEGATIVE Final    Comment: (NOTE) SARS-CoV-2 target nucleic acids are NOT DETECTED.  The SARS-CoV-2 RNA is generally detectable in upper and lower respiratory specimens during the acute phase of infection. The lowest concentration of SARS-CoV-2 viral copies this assay can detect is 250 copies / mL. Dixie Coppa negative result does not preclude SARS-CoV-2 infection and should not be used as the sole basis for treatment or other patient management decisions.  Shams Fill negative result may occur with improper specimen collection / handling, submission of specimen other than nasopharyngeal swab, presence of viral mutation(s) within the areas targeted by this assay, and inadequate number of viral copies (<250 copies / mL). Jahmir Salo negative result must be combined with clinical observations, patient history, and epidemiological information.  Fact Sheet for Patients:   https://www.patel.info/  Fact Sheet for Healthcare Providers: https://hall.com/  This test is not yet approved or  cleared by the [place] FDA and has been authorized for detection and/or diagnosis of SARS-CoV-2 by FDA under an Emergency Use Authorization (EUA).  This EUA will remain in effect (meaning this test can be used) for the duration of the COVID-19 declaration under Section 564(b)(1) of the Act, 21 U.S.C. section 360bbb-3(b)(1), unless the authorization is terminated or revoked sooner.  Performed at [facility], [place] [address], [place], [place] [address]   MRSA Next Gen by PCR, Nasal     Status: None   Collection Time: 02/05/22  9:45 PM   Specimen: Nasal Mucosa; Nasal Swab  Result Value Ref Range Status   MRSA by PCR Next Gen NOT DETECTED NOT  DETECTED Final    Comment: (NOTE) The GeneXpert MRSA Assay (FDA approved for NASAL specimens only), is one component of Zarina Pe comprehensive MRSA colonization surveillance program. It is not intended to diagnose MRSA infection nor to guide or monitor treatment for MRSA infections. Test performance is not FDA approved in patients  less than 32 years old. Performed at [facility], [place] [address], [place], [place] [address]          Radiology Studies: DG CHEST PORT 1 VIEW  Result Date: 02/06/2022 CLINICAL DATA:  61 year old male presenting for evaluation of cough and shoulder pain. EXAM: PORTABLE CHEST 1 VIEW COMPARISON:  February 05, 2022. FINDINGS: Interstitial thickening in the chest similar to previous imaging. Opacity in the LEFT upper chest similar to previous imaging in this patient with known LEFT upper lobe pulmonary mass. Diminished volume in the LEFT chest likely related underlying neoplasm and interstitial changes. Diffuse airspace process throughout aerated portions of LEFT lung is also similar. RIGHT chest is largely clear with the exception of potential new subtle opacities at the RIGHT lung base since previous imaging. Small amount of gas in the LEFT supraclavicular fossa likely related to recent biopsy. On limited assessment no acute skeletal findings. IMPRESSION: 1. Signs of pulmonary mass with diffuse interstitial and airspace disease in the LEFT chest likely [place] combination of interstitial, lymphangitic carcinomatosis and pneumonitis. Correlate with respiratory symptoms. 2. Subtle basilar opacities on the RIGHT better seen on chest CT compatible with known pulmonary nodules in this location. No new areas of lobar consolidation. 3. Postprocedural changes of biopsy in the LEFT supraclavicular region. Electronically Signed   By: Zetta Bills M.D.   On: 02/06/2022 11:54   [place] CORE BIOPSY (LYMPH NODES)  Result Date: 02/06/2022 INDICATION: Concern for metastatic lung cancer please  perform ultrasound-guided biopsy of left supraclavicular nodal mass for tissue diagnostic purposes. EXAM: ULTRASOUND-GUIDED LEFT SUPRACLAVICULAR LYMPH NODE BIOPSY COMPARISON:  Chest CT-02/05/2022 MEDICATIONS: None ANESTHESIA/SEDATION: None COMPLICATIONS: None immediate. TECHNIQUE: Informed written consent was obtained from the patient after Christinia Lambeth discussion of the risks, benefits and alternatives to treatment. Questions regarding the procedure were encouraged and answered. Initial ultrasound scanning demonstrated bulky left supraclavicular nodal conglomeration with dominant component measuring approximately 3.7 x 3.0 cm, similar to preceding chest CT image 22, series 3. An ultrasound image was saved for documentation purposes. The procedure was planned. Haisley Arens timeout was performed prior to the initiation of the procedure. The operative was prepped and draped in the usual sterile fashion, and Ramey Ketcherside sterile drape was applied covering the operative field. Tanishia Lemaster timeout was performed prior to the initiation of the procedure. Local anesthesia was provided with 1% lidocaine with epinephrine. Under direct ultrasound guidance, an 18 gauge core needle device was utilized to obtain to obtain 6 core needle biopsies of the left supraclavicular nodal conglomeration. The samples were placed in saline and submitted to pathology. The needle was removed and hemostasis was achieved with manual compression. Post procedure scan was negative for significant hematoma. Tyianna Menefee dressing was applied. The patient tolerated the procedure well without immediate postprocedural complication. IMPRESSION: Technically successful ultrasound guided biopsy of dominant left supraclavicular nodal conglomeration lymph node. Electronically Signed   By: Sandi Mariscal M.D.   On: 02/06/2022 10:37   CT CHEST W CONTRAST  Addendum Date: 02/06/2022   ADDENDUM REPORT: 02/06/2022 10:23 ADDENDUM: Images were reviewed again. There is 3 cm left supraclavicular lymph node suggesting  metastatic disease. There is Dominick Zertuche lytic lesion in right glenoid consistent with skeletal metastatic disease. Electronically Signed   By: Elmer Picker M.D.   On: 02/06/2022 10:23   Addendum Date: 02/05/2022   ADDENDUM REPORT: 02/05/2022 16:15 ADDENDUM: Images were reviewed with additional clinical history. In the previous CT chest done on 10/01/2020, there is 1.5 cm pleural-based noncalcified nodule in the medial left  upper lobe. In view of this prior finding, imaging findings in the left lung in the current study most likely suggest extensive malignant neoplastic process with lymphangitic spread of metastatic disease throughout left lung. Nodular densities in the right lung suggest metastatic disease. Enlarged lymph nodes in mediastinum suggest metastatic lymphadenopathy. Follow-up PET-CT and tissue sampling should be considered. Electronically Signed   By: Elmer Picker M.D.   On: 02/05/2022 16:15   Result Date: 02/06/2022 CLINICAL DATA:  Pneumonia EXAM: CT CHEST WITH CONTRAST TECHNIQUE: Multidetector CT imaging of the chest was performed during intravenous contrast administration. RADIATION DOSE REDUCTION: This exam was performed according to the departmental dose-optimization program which includes automated exposure control, adjustment of the mA and/or kV according to patient size and/or use of iterative reconstruction technique. CONTRAST:  19mL OMNIPAQUE IOHEXOL 350 MG/ML SOLN COMPARISON:  Previous CT done on 10/01/2020 and chest radiographs done today FINDINGS: Cardiovascular: There is homogeneous enhancement in thoracic aorta. There are no intraluminal filling defects in central pulmonary artery branches. Mediastinum/Nodes: There are enlarged lymph nodes in mediastinum and hilar regions. There is Norwood Quezada precarinal node measuring 1.3 cm in short axis. There is subcarinal node measuring 1.4 cm in short axis. There is 2 cm node in the inferior right hilum. Evaluation of left hilum for lymphadenopathy is  limited by dense adjacent infiltrates. Lungs/Pleura: There is large dense infiltrate in left upper lobe. There is 7 cm slightly inhomogeneous attenuation in the left apex. There is 5.5 cm density in the anteromedial left upper lobe. There is Rylah Fukuda 3.9 cm opacity in the left parahilar region and left lower lobe. There are other extensive patchy infiltrates in left upper lobe and left lower lobe. There is interval clearing of ground-glass densities in right lung. In image 116 of series 4, there is Mackson Botz new 1 cm nodule with spiculated margins in right lower lobe. In [place] [address], there is 7 mm pleural-based density in posterior right lower lung field. In image 139, there is 6 mm nodular density in the posterior right lower lobe. Small left pleural effusion is seen. There is no pneumothorax. Blebs and bullae are seen in the right apex. Upper Abdomen: There is fatty infiltration of the liver. Musculoskeletal: No focal lytic or sclerotic lesions are seen. IMPRESSION: There is interval appearance of large infiltrate in left upper lobe suggesting pneumonia. There are areas of homogeneous opacity without aeration in left upper lobe. Possibility of underlying malignant neoplastic processes not excluded. Extensive patchy infiltrates are seen in left lower lobe including 3.9 cm homogeneous opacity in the inferior left hilum. Small left pleural effusion is seen. There are enlarged lymph nodes in mediastinum and hilar regions suggesting infectious or neoplastic process. There are few nodular densities in right lower lobe largest measuring 10 mm in size with spiculated margins. This finding may suggest small foci of pneumonia or neoplastic process. Short-term follow-up CT after antibiotic treatment along with PET-CT if warranted should be considered. Electronically Signed: By: Elmer Picker M.D. On: 02/05/2022 14:29   MR SHOULDER RIGHT WO CONTRAST  Result Date: 02/05/2022 CLINICAL DATA:  Right shoulder pain.  Destructive lesion involving the glenoid noted on recent CT scan. EXAM: MRI OF THE RIGHT SHOULDER WITHOUT CONTRAST TECHNIQUE: Multiplanar, multisequence MR imaging of the shoulder was performed. No intravenous contrast was administered. COMPARISON:  Chest CT 02/05/2022 FINDINGS: Rotator cuff: The rotator cuff tendons are intact. Mild tendinopathy but no full-thickness retracted tear. Muscles:  No significant findings. Biceps long head:  Intact Acromioclavicular Joint:  Mild degenerative changes. Type 2-3 acromion. Significant anterior downsloping of the acromion. No lateral downsloping or subacromial spurring. Glenohumeral Joint: There is Poppy Mcafee very aggressive destructive bone lesions involving the scapula, most notably in the glenoid area but also involving the coracoid process and part of the scapular body. The bone is largely destroyed and there is Floreine Kingdon large associated soft tissue mass measuring approximately 5.0 x 4.5 cm. Osteomyelitis is Kasee Hantz consideration but I would expect to see severe surrounding myofasciitis and cellulitis which really is not present. Also, I would expect there would be septic arthritis and involvement of the humeral head which there is not. Biopsy is recommended. Labrum:  Degenerated and likely torn. Bones: Destructive scapular bone lesion as detailed above. No other bony structures are in. Other: No subacromial/subdeltoid fluid collections to suggest bursitis. Enlarged right axillary lymph node is noted. IMPRESSION: 1. Destructive scapular bone lesion with associated large soft tissue mass measuring approximately 5.0 x 4.5 cm. Although osteomyelitis is Deneka Greenwalt consideration I think it is unlikely and more likely Keiarah Orlowski neoplastic process. 2. Biopsy is recommended. 3. Intact rotator cuff tendons and long head biceps tendon. Electronically Signed   By: Marijo Sanes M.D.   On: 02/05/2022 20:09   DG Shoulder Right Port  Addendum Date: 02/05/2022   ADDENDUM REPORT: 02/05/2022 16:27 ADDENDUM: The original  report was by Dr. Van Clines. The following addendum is by Dr. Van Clines: These results were called by telephone at the time of interpretation on 02/05/2022 at 4:24 pm to provider [facility] , who verbally acknowledged these results. Electronically Signed   By: Van Clines M.D.   On: 02/05/2022 16:27   Result Date: 02/05/2022 CLINICAL DATA:  Coughing.  Right shoulder pain. EXAM: RIGHT SHOULDER - 2 VIEWS COMPARISON:  Chest radiograph 02/05/2022 FINDINGS: Permeative lytic process in the right glenoid. The proximal humerus and distal clavicle appear intact. IMPRESSION: 1. Permeative lytic lesion in the right glenoid favoring osteomyelitis given the concomitant extensive left pneumonia. Septic right glenohumeral joint not excluded. Radiology assistant personnel have been notified to put me in telephone contact with the referring physician or the referring physician's clinical representative in order to discuss these findings. Once this communication is established I will issue an addendum to this report for documentation purposes. Electronically Signed: By: Van Clines M.D. On: 02/05/2022 16:21   DG Chest 2 View  Result Date: 02/05/2022 CLINICAL DATA:  Chronic for 1 month.  Right shoulder pain. EXAM: CHEST - 2 VIEW COMPARISON:  Chest two views 09/30/2020, CT chest 10/01/2020 FINDINGS: There is mild high-grade heterogeneous airspace opacification throughout the mid and superior aspects of the left lung with diffuse left lung interstitial thickening also extending into the inferior left lung. More homogeneous opacification of the left lung apex, possible pleural fluid versus airspace opacity. The right lung is clear. No inferior layering pleural effusion is seen. No pneumothorax. The cardiac silhouette is partially obscured but does not appear enlarged. Mediastinal contours are not well evaluated. No acute skeletal abnormality. IMPRESSION: Heterogeneous airspace opacification of the mid  to upper left lung with diffuse left lung interstitial thickening. Findings are suspicious for high-grade pneumonia. Note is made on prior radiographs and CT there was clinical concern for ground-glass, multifocal atypical pneumonia. Is this patient immunocompromised? Electronically Signed   By: Yvonne Kendall M.D.   On: 02/05/2022 09:50        Scheduled Meds:  bictegravir-emtricitabine-tenofovir AF  1 tablet Oral Daily   diclofenac Sodium  2 g Topical QID  enoxaparin (LOVENOX) injection  40 mg Subcutaneous Q24H   feeding supplement  237 mL Oral TID BM   guaiFENesin  600 mg Oral BID   nicotine  14 mg Transdermal Daily   sodium chloride flush  3 mL Intravenous Q12H   Continuous Infusions:   LOS: 1 day    Time spent: over 30 min    Fayrene Helper, MD [facility]   To contact the attending provider between 7A-7P or the covering provider during after hours 7P-7A, please log into the web site [email or web address] and access using universal [place] password for that web site. If you do not have the password, please call the hospital operator.  02/06/2022, 6:42 PM

## 2022-02-06 NOTE — Progress Notes (Signed)
[facility] for Infectious Disease  Date of Admission:  02/05/2022           Reason for visit: Follow up on HIV  Current antibiotics: Ceftriaxone Azithromycin  ASSESSMENT:    61 y.o. male admitted with:  Concern for advanced stage lung cancer: Patient admitted and imaging revealed a left upper lobe lung mass with adenopathy.  Status post lymph node biopsy today.  Pathology is pending.  Patient also had an MRI of his shoulder to exclude infection based on x-ray radiology read.  This is also concerning for neoplastic process. HIV disease: Currently well controlled on Biktarvy.  RECOMMENDATIONS:    Continue Biktarvy At this time, low suspicion for bacterial pneumonia and will stop antibiotics Outpatient follow-up arranged with patient's primary HIV provider for next week to further discuss results and help coordinate any oncology/pulmonary follow-up that might be needed Okay to discharge from ID perspective At this time, we will sign off but please feel free to call as needed   Principal Problem:   HIV disease ([place]) Active Problems:   Tobacco use   Right shoulder pain   Pneumonia of left lung due to infectious organism   Thrombocytosis   Weight loss    MEDICATIONS:    Scheduled Meds:  bictegravir-emtricitabine-tenofovir AF  1 tablet Oral Daily   diclofenac Sodium  2 g Topical QID   enoxaparin (LOVENOX) injection  40 mg Subcutaneous Q24H   feeding supplement  237 mL Oral TID BM   guaiFENesin  600 mg Oral BID   nicotine  14 mg Transdermal Daily   sodium chloride flush  3 mL Intravenous Q12H   Continuous Infusions: PRN Meds:.acetaminophen **OR** acetaminophen, albuterol, HYDROcodone-acetaminophen, ondansetron **OR** ondansetron (ZOFRAN) IV  SUBJECTIVE:   24 hour events:  No acute events have been noted overnight Patient remains in the emergency department waiting for room Afebrile On 2 L of nasal cannula this morning Status post lymph node biopsy this  morning with IR Procalcitonin was negative  Patient reports that he is feeling okay this morning.  He has been told about the concern for malignancy.  He wants to make sure that his Biktarvy as ordered.  Review of Systems  All other systems reviewed and are negative.     OBJECTIVE:   Blood pressure 110/77, pulse 89, temperature 97.7 F (36.5 C), temperature source Oral, resp. rate (!) 94, height 5\' 9"  (1.753 m), weight 59 kg, SpO2 95 %. Body mass index is 19.21 kg/m.  Physical Exam Constitutional:      General: He is not in acute distress.    Appearance: Normal appearance.  HENT:     Head: Normocephalic and atraumatic.  Eyes:     Extraocular Movements: Extraocular movements intact.     Conjunctiva/sclera: Conjunctivae normal.  Pulmonary:     Effort: Pulmonary effort is normal. No respiratory distress.  Abdominal:     General: There is no distension.     Palpations: Abdomen is soft.  Musculoskeletal:     Right lower leg: No edema.     Left lower leg: No edema.  Skin:    General: Skin is warm and dry.  Neurological:     General: No focal deficit present.     Mental Status: He is alert and oriented to person, place, and time.  Psychiatric:        Mood and Affect: Mood normal.        Behavior: Behavior normal.      Lab  Results: Lab Results  Component Value Date   WBC 8.9 02/06/2022   HGB 11.4 (L) 02/06/2022   HCT 35.6 (L) 02/06/2022   MCV 81.5 02/06/2022   PLT 476 (H) 02/06/2022    Lab Results  Component Value Date   NA 137 02/06/2022   K 4.4 02/06/2022   CO2 23 02/06/2022   GLUCOSE 79 02/06/2022   BUN 16 02/06/2022   CREATININE 0.93 02/06/2022   CALCIUM 8.0 (L) 02/06/2022   GFRNONAA >60 02/06/2022   GFRAA >60 02/18/2018    Lab Results  Component Value Date   ALT 13 02/05/2022   AST 18 02/05/2022   ALKPHOS 62 02/05/2022   BILITOT 0.4 02/05/2022    No results found for: "CRP"  No results found for: "ESRSEDRATE"   I have reviewed the micro and  lab results in Epic.  Imaging: DG CHEST PORT 1 VIEW  Result Date: 02/06/2022 CLINICAL DATA:  61 year old male presenting for evaluation of cough and shoulder pain. EXAM: PORTABLE CHEST 1 VIEW COMPARISON:  February 05, 2022. FINDINGS: Interstitial thickening in the chest similar to previous imaging. Opacity in the LEFT upper chest similar to previous imaging in this patient with known LEFT upper lobe pulmonary mass. Diminished volume in the LEFT chest likely related underlying neoplasm and interstitial changes. Diffuse airspace process throughout aerated portions of LEFT lung is also similar. RIGHT chest is largely clear with the exception of potential new subtle opacities at the RIGHT lung base since previous imaging. Small amount of gas in the LEFT supraclavicular fossa likely related to recent biopsy. On limited assessment no acute skeletal findings. IMPRESSION: 1. 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 Correlate with respiratory symptoms. 2. Subtle basilar opacities on the RIGHT better seen on chest CT compatible with known pulmonary nodules in this location. No new areas of lobar consolidation. 3. Postprocedural changes of biopsy in the LEFT supraclavicular region. Electronically Signed   By: Zetta Bills M.D.   On: 02/06/2022 11:54   [place] CORE BIOPSY (LYMPH NODES)  Result Date: 02/06/2022 INDICATION: Concern for metastatic lung cancer please perform ultrasound-guided biopsy of left supraclavicular nodal mass for tissue diagnostic purposes. EXAM: ULTRASOUND-GUIDED LEFT SUPRACLAVICULAR LYMPH NODE BIOPSY COMPARISON:  Chest CT-02/05/2022 MEDICATIONS: None ANESTHESIA/SEDATION: None COMPLICATIONS: None immediate. TECHNIQUE: Informed written consent was obtained from the patient after a discussion of the risks, benefits and alternatives to treatment. Questions regarding the  procedure were encouraged and answered. Initial ultrasound scanning demonstrated bulky left supraclavicular nodal conglomeration with dominant component measuring approximately 3.7 x 3.0 cm, similar to preceding chest CT image 22, series 3. An ultrasound image was saved for documentation purposes. The procedure was planned. A timeout was performed prior to the initiation of the procedure. The operative was prepped and draped in the usual sterile fashion, and a sterile drape was applied covering the operative field. A timeout was performed prior to the initiation of the procedure. Local anesthesia was provided with 1% lidocaine with epinephrine. Under direct ultrasound guidance, an 18 gauge core needle device was utilized to obtain to obtain 6 core needle biopsies of the left supraclavicular nodal conglomeration. The samples were placed in saline and submitted to pathology. The needle was removed and hemostasis was achieved with manual compression. Post procedure scan was negative for significant hematoma. A dressing was applied. The patient tolerated the procedure well without immediate postprocedural complication. IMPRESSION: Technically successful ultrasound guided biopsy of  dominant left supraclavicular nodal conglomeration lymph node. Electronically Signed   By: Sandi Mariscal M.D.   On: 02/06/2022 10:37   CT CHEST W CONTRAST  Addendum Date: 02/06/2022   ADDENDUM REPORT: 02/06/2022 10:23 ADDENDUM: Images were reviewed again. There is 3 cm left supraclavicular lymph node suggesting metastatic disease. There is a lytic lesion in right glenoid consistent with skeletal metastatic disease. Electronically Signed   By: Elmer Picker M.D.   On: 02/06/2022 10:23   Addendum Date: 02/05/2022   ADDENDUM REPORT: 02/05/2022 16:15 ADDENDUM: Images were reviewed with additional clinical history. In the previous CT chest done on 10/01/2020, there is 1.5 cm pleural-based noncalcified nodule in the medial left upper lobe.  In view of this prior finding, imaging findings in the left lung in the current study most likely suggest extensive malignant neoplastic process with lymphangitic spread of metastatic disease throughout left lung. Nodular densities in the right lung suggest metastatic disease. Enlarged lymph nodes in mediastinum suggest metastatic lymphadenopathy. Follow-up PET-CT and tissue sampling should be considered. Electronically Signed   By: Elmer Picker M.D.   On: 02/05/2022 16:15   Result Date: 02/06/2022 CLINICAL DATA:  Pneumonia EXAM: CT CHEST WITH CONTRAST TECHNIQUE: Multidetector CT imaging of the chest was performed during intravenous contrast administration. RADIATION DOSE REDUCTION: This exam was performed according to the departmental dose-optimization program which includes automated exposure control, adjustment of the mA and/or kV according to patient size and/or use of iterative reconstruction technique. CONTRAST:  91mL OMNIPAQUE IOHEXOL 350 MG/ML SOLN COMPARISON:  Previous CT done on 10/01/2020 and chest radiographs done today FINDINGS: Cardiovascular: There is homogeneous enhancement in thoracic aorta. There are no intraluminal filling defects in central pulmonary artery branches. Mediastinum/Nodes: There are enlarged lymph nodes in mediastinum and hilar regions. There is a precarinal node measuring 1.3 cm in short axis. There is subcarinal node measuring 1.4 cm in short axis. There is 2 cm node in the inferior right hilum. Evaluation of left hilum for lymphadenopathy is limited by dense adjacent infiltrates. Lungs/Pleura: There is large dense infiltrate in left upper lobe. There is 7 cm slightly inhomogeneous attenuation in the left apex. There is 5.5 cm density in the anteromedial left upper lobe. There is a 3.9 cm opacity in the left parahilar region and left lower lobe. There are other extensive patchy infiltrates in left upper lobe and left lower lobe. There is interval clearing of ground-glass  densities in right lung. In image 116 of series 4, there is a new 1 cm nodule with spiculated margins in right lower lobe. In [place] one hundred 31, there is 7 mm pleural-based density in posterior right lower lung field. In image 139, there is 6 mm nodular density in the posterior right lower lobe. Small left pleural effusion is seen. There is no pneumothorax. Blebs and bullae are seen in the right apex. Upper Abdomen: There is fatty infiltration of the liver. Musculoskeletal: No focal lytic or sclerotic lesions are seen. IMPRESSION: There is interval appearance of large infiltrate in left upper lobe suggesting pneumonia. There are areas of homogeneous opacity without aeration in left upper lobe. Possibility of underlying malignant neoplastic processes not excluded. Extensive patchy infiltrates are seen in left lower lobe including 3.9 cm homogeneous opacity in the inferior left hilum. Small left pleural effusion is seen. There are enlarged lymph nodes in mediastinum and hilar regions suggesting infectious or neoplastic process. There are few nodular densities in right lower lobe largest measuring 10 mm in size with spiculated margins.  This finding may suggest small foci of pneumonia or neoplastic process. Short-term follow-up CT after antibiotic treatment along with PET-CT if warranted should be considered. Electronically Signed: By: Elmer Picker M.D. On: 02/05/2022 14:29   MR SHOULDER RIGHT WO CONTRAST  Result Date: 02/05/2022 CLINICAL DATA:  Right shoulder pain. Destructive lesion involving the glenoid noted on recent CT scan. EXAM: MRI OF THE RIGHT SHOULDER WITHOUT CONTRAST TECHNIQUE: Multiplanar, multisequence MR imaging of the shoulder was performed. No intravenous contrast was administered. COMPARISON:  Chest CT 02/05/2022 FINDINGS: Rotator cuff: The rotator cuff tendons are intact. Mild tendinopathy but no full-thickness retracted tear. Muscles:  No significant findings. Biceps long head:   Intact Acromioclavicular Joint: Mild degenerative changes. Type 2-3 acromion. Significant anterior downsloping of the acromion. No lateral downsloping or subacromial spurring. Glenohumeral Joint: There is a very aggressive destructive bone lesions involving the scapula, most notably in the glenoid area but also involving the coracoid process and part of the scapular body. The bone is largely destroyed and there is a large associated soft tissue mass measuring approximately 5.0 x 4.5 cm. Osteomyelitis is a consideration but I would expect to see severe surrounding myofasciitis and cellulitis which really is not present. Also, I would expect there would be septic arthritis and involvement of the humeral head which there is not. Biopsy is recommended. Labrum:  Degenerated and likely torn. Bones: Destructive scapular bone lesion as detailed above. No other bony structures are in. Other: No subacromial/subdeltoid fluid collections to suggest bursitis. Enlarged right axillary lymph node is noted. IMPRESSION: 1. Destructive scapular bone lesion with associated large soft tissue mass measuring approximately 5.0 x 4.5 cm. Although osteomyelitis is a consideration I think it is unlikely and more likely a neoplastic process. 2. Biopsy is recommended. 3. Intact rotator cuff tendons and long head biceps tendon. Electronically Signed   By: Marijo Sanes M.D.   On: 02/05/2022 20:09   DG Shoulder Right Port  Addendum Date: 02/05/2022   ADDENDUM REPORT: 02/05/2022 16:27 ADDENDUM: The original report was by Dr. Van Clines. The following addendum is by Dr. Van Clines: These results were called by telephone at the time of interpretation on 02/05/2022 at 4:24 pm to provider [facility] , who verbally acknowledged these results. Electronically Signed   By: Van Clines M.D.   On: 02/05/2022 16:27   Result Date: 02/05/2022 CLINICAL DATA:  Coughing.  Right shoulder pain. EXAM: RIGHT SHOULDER - 2 VIEWS COMPARISON:   Chest radiograph 02/05/2022 FINDINGS: Permeative lytic process in the right glenoid. The proximal humerus and distal clavicle appear intact. IMPRESSION: 1. Permeative lytic lesion in the right glenoid favoring osteomyelitis given the concomitant extensive left pneumonia. Septic right glenohumeral joint not excluded. Radiology assistant personnel have been notified to put me in telephone contact with the referring physician or the referring physician's clinical representative in order to discuss these findings. Once this communication is established I will issue an addendum to this report for documentation purposes. Electronically Signed: By: Van Clines M.D. On: 02/05/2022 16:21   DG Chest 2 View  Result Date: 02/05/2022 CLINICAL DATA:  Chronic for 1 month.  Right shoulder pain. EXAM: CHEST - 2 VIEW COMPARISON:  Chest two views 09/30/2020, CT chest 10/01/2020 FINDINGS: There is mild high-grade heterogeneous airspace opacification throughout the mid and superior aspects of the left lung with diffuse left lung interstitial thickening also extending into the inferior left lung. More homogeneous opacification of the left lung apex, possible pleural fluid versus airspace opacity. The right  lung is clear. No inferior layering pleural effusion is seen. No pneumothorax. The cardiac silhouette is partially obscured but does not appear enlarged. Mediastinal contours are not well evaluated. No acute skeletal abnormality. IMPRESSION: Heterogeneous airspace opacification of the mid to upper left lung with diffuse left lung interstitial thickening. Findings are suspicious for high-grade pneumonia. Note is made on prior radiographs and CT there was clinical concern for ground-glass, multifocal atypical pneumonia. Is this patient immunocompromised? Electronically Signed   By: Yvonne Kendall M.D.   On: 02/05/2022 09:50     Imaging independently reviewed in Epic.    [facility] [phone] pager 02/06/2022, 12:10 PM

## 2022-02-07 ENCOUNTER — Other Ambulatory Visit (HOSPITAL_COMMUNITY): Payer: Self-pay

## 2022-02-07 DIAGNOSIS — R634 Abnormal weight loss: Secondary | ICD-10-CM | POA: Diagnosis not present

## 2022-02-07 DIAGNOSIS — R590 Localized enlarged lymph nodes: Secondary | ICD-10-CM

## 2022-02-07 DIAGNOSIS — C3492 Malignant neoplasm of unspecified part of left bronchus or lung: Secondary | ICD-10-CM | POA: Diagnosis not present

## 2022-02-07 DIAGNOSIS — B20 Human immunodeficiency virus [HIV] disease: Secondary | ICD-10-CM | POA: Diagnosis not present

## 2022-02-07 LAB — CBC WITH DIFFERENTIAL/PLATELET
Abs Immature Granulocytes: 0.04 10*3/uL (ref 0.00–0.07)
Basophils Absolute: 0 10*3/uL (ref 0.0–0.1)
Basophils Relative: 0 %
Eosinophils Absolute: 0.4 10*3/uL (ref 0.0–0.5)
Eosinophils Relative: 5 %
HCT: 39.7 % (ref 39.0–52.0)
Hemoglobin: 12 g/dL — ABNORMAL LOW (ref 13.0–17.0)
Immature Granulocytes: 0 %
Lymphocytes Relative: 13 %
Lymphs Abs: 1.2 10*3/uL (ref 0.7–4.0)
MCH: 26.1 pg (ref 26.0–34.0)
MCHC: 30.2 g/dL (ref 30.0–36.0)
MCV: 86.5 fL (ref 80.0–100.0)
Monocytes Absolute: 0.9 10*3/uL (ref 0.1–1.0)
Monocytes Relative: 10 %
Neutro Abs: 6.5 10*3/uL (ref 1.7–7.7)
Neutrophils Relative %: 72 %
Platelets: 622 10*3/uL — ABNORMAL HIGH (ref 150–400)
RBC: 4.59 MIL/uL (ref 4.22–5.81)
RDW: 14.5 % (ref 11.5–15.5)
WBC: 9.1 10*3/uL (ref 4.0–10.5)
nRBC: 0 % (ref 0.0–0.2)

## 2022-02-07 LAB — COMPREHENSIVE METABOLIC PANEL
ALT: 10 U/L (ref 0–44)
AST: 15 U/L (ref 15–41)
Albumin: 2.2 g/dL — ABNORMAL LOW (ref 3.5–5.0)
Alkaline Phosphatase: 53 U/L (ref 38–126)
Anion gap: 8 (ref 5–15)
BUN: 13 mg/dL (ref 8–23)
CO2: 24 mmol/L (ref 22–32)
Calcium: 8.3 mg/dL — ABNORMAL LOW (ref 8.9–10.3)
Chloride: 104 mmol/L (ref 98–111)
Creatinine, Ser: 0.9 mg/dL (ref 0.61–1.24)
GFR, Estimated: >60 mL/min (ref >60)
Glucose, Bld: 99 mg/dL (ref 70–99)
Potassium: 4.3 mmol/L (ref 3.5–5.1)
Sodium: 136 mmol/L (ref 135–145)
Total Bilirubin: 0.3 mg/dL (ref 0.3–1.2)
Total Protein: 6.5 g/dL (ref 6.5–8.1)

## 2022-02-07 LAB — MAGNESIUM: Magnesium: 2.1 mg/dL (ref 1.7–2.4)

## 2022-02-07 LAB — PROCALCITONIN: Procalcitonin: <0.1 ng/mL

## 2022-02-07 LAB — GLUCOSE, CAPILLARY: Glucose-Capillary: 117 mg/dL — ABNORMAL HIGH (ref 70–99)

## 2022-02-07 LAB — PHOSPHORUS: Phosphorus: 3.5 mg/dL (ref 2.5–4.6)

## 2022-02-07 MED ORDER — OXYCODONE HCL 5 MG PO TABS
5.0000 mg | ORAL_TABLET | Freq: Three times a day (TID) | ORAL | 0 refills | Status: DC | PRN
  Filled 2022-02-07: qty 20, 7d supply, fill #0

## 2022-02-07 MED ORDER — NICOTINE 14 MG/24HR TD PT24
14.0000 mg | MEDICATED_PATCH | Freq: Every day | TRANSDERMAL | 0 refills | Status: DC
  Filled 2022-02-07: qty 28, 28d supply, fill #0

## 2022-02-07 MED ORDER — DICLOFENAC SODIUM 1 % EX GEL
2.0000 g | Freq: Four times a day (QID) | CUTANEOUS | 0 refills | Status: AC
  Filled 2022-02-07: qty 100, 12d supply, fill #0

## 2022-02-07 MED ORDER — ALBUTEROL SULFATE HFA 108 (90 BASE) MCG/ACT IN AERS
2.0000 | INHALATION_SPRAY | Freq: Four times a day (QID) | RESPIRATORY_TRACT | 2 refills | Status: DC | PRN
  Filled 2022-02-07: qty 6.7, 25d supply, fill #0

## 2022-02-07 NOTE — Consult Note (Signed)
NAME:  Steven Johnston, MRN:  [id number], DOB:  Nov 25, 1960, LOS: 2 ADMISSION DATE:  02/05/2022, CONSULTATION DATE:  02/05/22 REFERRING MD:  Tamala Julian CHIEF COMPLAINT:  cough, weight loss, and shortness of breath  History of Present Illness:  Patient is a 61 year old male with a past medical history of HIV on Biktarvy and Hep B who presents from urgent care for a cough and dyspnea starting 1 month ago. He is followed by Dr. West Bali at the [facility]. He was on Bactrim in December 2022 for PJP ppx which was noted to be held at this last ID clinic appointment on 01/17/22.  It was also documented at that appointment he had recently lost 25 lbs from April to August.  He reports right shoulder pain starting a month ago while working in his mother's attic.    The patient went to urgent care for worsening dyspnea and productive cough despite use of Mucinex and cough syrup on 02/05/22.  His chest x-ray revealed infiltrates in the left lobe so he was advised to come to the ED.  He denies any bloody sputum and fevers.  States he had an episode of night sweats a few nights ago.  He received IV azithromycin and rocephin in the ED.  He is currently on room air with O2 saturations ranging from 94-99% on room air.  Pertinent  Medical History  HIV  Significant Hospital Events: Including procedures, antibiotic start and stop dates in addition to other pertinent events   9/6: Pt presented to the ED, PCCM consulted for evaluation of cough, shortness of breath and weight loss 9/7 LN biopsy Interim History / Subjective:  No SOB.   Objective   Blood pressure 107/63, pulse 87, temperature 97.7 F (36.5 C), temperature source Oral, resp. rate (!) 24, height 5\' 9"  (1.753 m), weight 59 kg, SpO2 98 %.        Intake/Output Summary (Last 24 hours) at 02/07/2022 1436 Last data filed at 02/07/2022 0345 Gross per 24 hour  Intake --  Output 400 ml  Net -400 ml   Filed Weights   02/05/22 1026  Weight: 59 kg     Examination: General: cachectic man lying in bed in NAD HENT: temporal wasting   Lungs: CTAB, no conversational dyspnea, breathing comfortably on RA Cardiovascular: S1S2, RRR Abdomen: ND Extremities: minimal muscle mass  Assessment & Plan:  NSCLC- at least stage 3 with N3 disease R Lung Pulmonary Nodule, possibly metastatic foci vs second primary Cough & Dyspnea, concer for community acquired pneumonia Left Neck Cervical Lymphadenopathy  Presented with cough, dyspnea, weight loss. Radiographic findings on CT with mass like consolidation in LUL, nodular densities.  On review of prior imaging in May 2022, he did have paramediastinal left mass. Findings increased in size on recent imaging, small nodule in right as well raising concern for metastatic disease. Original concern for infectious process/pneumonia less likely given no fevers, WBC 8.7 on 9/6, no clinical PNA (sputum production etc). COVID negative. Prior studies suggest medical compliance with HRT, less likely opportunistic infection.   -Next gen sequencing sent -I reviewed the patholgoy results with Mr. Creekmore- that he has lung cancer that is at least stage 3. He has a referral that has already been placed to Oncology. He understands he will have several upcoming appointments he needs to attend. -stressed the importance of quitting smoking  HIV -per primary  Unintentional Weight Loss Patient has lost over 25 lbs in the last 3 months, concern for malignancy  -  need to focus on nutrition  Nicotine Use Patient currently smokes 5 cigarettes per day -agree with nicotine patch -stressed the importance of quitting  Best Practice (right click and "Reselect all SmartList Selections" daily)   Per primary  Labs   CBC: Recent Labs  Lab 02/05/22 1119 02/06/22 0258 02/07/22 0342  WBC 8.7 8.9 9.1  NEUTROABS 6.3  --  6.5  HGB 13.0 11.4* 12.0*  HCT 41.8 35.6* 39.7  MCV 82.9 81.5 86.5  PLT 560* 476* 622*    Julian Hy, DO 02/07/22 2:45 PM [facility]

## 2022-02-07 NOTE — Progress Notes (Signed)
   02/07/22 0900  Assess: MEWS Score  BP 90/61  MAP (mmHg) 70  Pulse Rate 100  ECG Heart Rate 100  Resp (!) 24  SpO2 96 %  O2 Device Room Air  Assess: MEWS Score  MEWS Temp 0  MEWS Systolic 1  MEWS Pulse 0  MEWS RR 1  MEWS LOC 0  MEWS Score 2  MEWS Score Color Yellow  Assess: if the MEWS score is Yellow or Red  Were vital signs taken at a resting state? Yes  Focused Assessment No change from prior assessment  Does the patient meet 2 or more of the SIRS criteria? Yes  Does the patient have a confirmed or suspected source of infection? Yes  MEWS guidelines implemented *See Row Information* Yes  Take Vital Signs  Increase Vital Sign Frequency  Yellow: Q 2hr X 2 then Q 4hr X 2, if remains yellow, continue Q 4hrs  Escalate  MEWS: Escalate Yellow: discuss with charge nurse/RN and consider discussing with provider and RRT  Notify: Charge Nurse/RN  Name of Charge Nurse/RN Notified Erin  Date Charge Nurse/RN Notified 02/07/22  Time Charge Nurse/RN Notified 1012  Document  Progress note created (see row info) Yes  Assess: SIRS CRITERIA  SIRS Temperature  0  SIRS Pulse 1  SIRS Respirations  1  SIRS WBC 1  SIRS Score Sum  3

## 2022-02-07 NOTE — Discharge Summary (Signed)
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  His cough has been intermittently productive with yellowish to greenish sputum.  He's also had 25 lbs of weight loss and night sweats.  He had imaging findings concerning for metastatic cancer, L supraclavicular nodal conglomeration bx showing non small cell carcinoma.    Stable at time of discharge.  Discharge with plan for follow up with oncology and infectious disease.  Hospital Course:  Assessment and Plan: * HIV disease ([place]) [place] ID follow up outpatient   Non small cell carcinoma  Metastatic disease ([place]) Presentation at this time concerning for metastatic cancer  CT chest 9/6 notable for large infiltrate in left upper lobe, areas of homogenous opacity without aeration in left upper lobe, extensive patchy infiltrates seen in LLL including 3.9 cm homgenous opacity in the inferior left hilum, small L effusion, enlarged lymph nodes in mediastinum and hilar  regions, few nodular densities in right lower lobe, larges measuring 10 mm in size with spiculated margins.  3 cm supraclavicular lymph nodue suggesting metastatic disease.  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  1.5 cm pleural based noncalcified nodule in medial left upper lobe.  Findings in left lung suggest extensive malignant neoplastic process with lymphangitic spread of metastatic disease throughout left lung.  Nodular densities in R lung suggest metastatic disease.  Enlarged LN's in mediastinum suggest metastatic LAD.  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   Consultations: IR ID pulmonology  Discharge Exam: Vitals:   02/07/22 1138 02/07/22 1200  BP: 103/62 107/63  Pulse: 92 87  Resp: (!) 26 (!) 24  Temp: 97.7 F (36.5 C)   SpO2: 100% 98%   No new complaints Pain controlled with current meds Discussed need for ID follow up and oncology follow up  General: No acute distress. Cardiovascular: RRR Lungs: unlabored Abdomen: Soft, nontender, nondistended  Neurological: Alert and oriented 3. Moves all extremities 4 with equal  strength. Cranial nerves II through XII grossly intact. Extremities: No clubbing or cyanosis. No edema.  Discharge Instructions   Discharge Instructions     Ambulatory  referral to Hematology / Oncology   Complete by: As directed    Biopsy pending, concern for metastatic cancer   Call MD for:  difficulty breathing, headache or visual disturbances   Complete by: As directed    Call MD for:  extreme fatigue   Complete by: As directed    Call MD for:  hives   Complete by: As directed    Call MD for:  persistant dizziness or light-headedness   Complete by: As directed    Call MD for:  persistant nausea and vomiting   Complete by: As directed    Call MD for:  redness, tenderness, or signs of infection (pain, swelling, redness, odor or green/yellow discharge around incision site)   Complete by: As directed    Call MD for:  severe uncontrolled pain   Complete by: As directed    Call MD for:  temperature >100.4   Complete by: As directed    Diet - low sodium heart healthy   Complete by: As directed    Discharge instructions   Complete by: As directed    You were found to have Steven Johnston lung mass, enlarged lymph nodes, and Steven Johnston right scapular lesion concerning for metastatic cancer.  You had Steven Johnston biopsy done by interventional radiology.  These results are concerning for poorly differentiated non small cell carcinoma.  We've placed Steven Johnston referral to oncology, they'll arrange an appointment with you.  You should follow up with infectious disease as scheduled.    Return for new, recurrent, or worsening symptoms.  Please ask your PCP to request records from this hospitalization so they know what was done and what the next steps will be.   Increase activity slowly   Complete by: As directed       Allergies as of 02/07/2022   No Known Allergies      Medication List     TAKE these medications    albuterol 108 (90 Base) MCG/ACT inhaler Commonly known as: VENTOLIN HFA Inhale 2 puffs into the lungs every 6 (six) hours as needed for wheezing or shortness of breath.   bictegravir-emtricitabine-tenofovir AF 50-200-25 MG Tabs tablet Commonly known as: BIKTARVY Take 1 tablet  by mouth daily.   diclofenac Sodium 1 % Gel Commonly known as: VOLTAREN Apply 2 g topically 4 (four) times daily.   nicotine 14 mg/24hr patch Commonly known as: NICODERM CQ - dosed in mg/24 hours Place 1 patch (14 mg total) onto the skin daily. Start taking on: February 08, 2022   oxyCODONE 5 MG immediate release tablet Commonly known as: Roxicodone Take 1 tablet (5 mg total) by mouth every 8 (eight) hours as needed.       No Known Allergies  Follow-up Information     Rosiland Oz, MD Follow up.   Specialty: Infectious Diseases Why: you have an appointment at 11:15 am on Tuesday 02/11/2022 Contact information: [address] [place] [id number] [facility] Follow up.   Specialty: Oncology Why: you should expect Daoud Lobue phone call for follow up of your poorly differentiated non small cell carcinoma.  If you don't get Mancil Pfenning phone call within the next week, please call for an appointment. Contact information: [place] [phone] [place] [phone]  The results of significant diagnostics from this hospitalization (including imaging, microbiology, ancillary and laboratory) are listed below for reference.    Significant Diagnostic Studies: DG CHEST PORT 1 VIEW  Result Date: 02/06/2022 CLINICAL DATA:  61 year old male presenting for evaluation of cough and shoulder pain. EXAM: PORTABLE CHEST 1 VIEW COMPARISON:  February 05, 2022. FINDINGS: Interstitial thickening in the chest similar to previous imaging. Opacity in the LEFT upper chest similar to previous imaging in this patient with known LEFT upper lobe pulmonary mass. Diminished volume in the LEFT chest likely related underlying neoplasm and interstitial changes. Diffuse airspace process throughout aerated portions of LEFT lung is also similar. RIGHT chest is largely clear with the exception of  potential new subtle opacities at the RIGHT lung base since previous imaging. Small amount of gas in the LEFT supraclavicular fossa likely related to recent biopsy. On limited assessment no acute skeletal findings. IMPRESSION: 1. Signs of pulmonary mass with diffuse interstitial and airspace disease in the LEFT chest likely Jakiera Ehler combination of interstitial, lymphangitic carcinomatosis and pneumonitis. Correlate with respiratory symptoms. 2. Subtle basilar opacities on the RIGHT better seen on chest CT compatible with known pulmonary nodules in this location. No new areas of lobar consolidation. 3. Postprocedural changes of biopsy in the LEFT supraclavicular region. Electronically Signed   By: Zetta Bills M.D.   On: 02/06/2022 11:54   [place] CORE BIOPSY (LYMPH NODES)  Result Date: 02/06/2022 INDICATION: Concern for metastatic lung cancer please perform ultrasound-guided biopsy of left supraclavicular nodal mass for tissue diagnostic purposes. EXAM: ULTRASOUND-GUIDED LEFT SUPRACLAVICULAR LYMPH NODE BIOPSY COMPARISON:  Chest CT-02/05/2022 MEDICATIONS: None ANESTHESIA/SEDATION: None COMPLICATIONS: None immediate. TECHNIQUE: Informed written consent was obtained from the patient after Amanda Pote discussion of the risks, benefits and alternatives to treatment. Questions regarding the procedure were encouraged and answered. Initial ultrasound scanning demonstrated bulky left supraclavicular nodal conglomeration with dominant component measuring approximately 3.7 x 3.0 cm, similar to preceding chest CT image 22, series 3. An ultrasound image was saved for documentation purposes. The procedure was planned. Shaylinn Hladik timeout was performed prior to the initiation of the procedure. The operative was prepped and draped in the usual sterile fashion, and Azzie Thiem sterile drape was applied covering the operative field. Sangeeta Youse timeout was performed prior to the initiation of the procedure. Local anesthesia was provided with 1% lidocaine with epinephrine. Under  direct ultrasound guidance, an 18 gauge core needle device was utilized to obtain to obtain 6 core needle biopsies of the left supraclavicular nodal conglomeration. The samples were placed in saline and submitted to pathology. The needle was removed and hemostasis was achieved with manual compression. Post procedure scan was negative for significant hematoma. Detra Bores dressing was applied. The patient tolerated the procedure well without immediate postprocedural complication. IMPRESSION: Technically successful ultrasound guided biopsy of dominant left supraclavicular nodal conglomeration lymph node. Electronically Signed   By: Sandi Mariscal M.D.   On: 02/06/2022 10:37   CT CHEST W CONTRAST  Addendum Date: 02/06/2022   ADDENDUM REPORT: 02/06/2022 10:23 ADDENDUM: Images were reviewed again. There is 3 cm left supraclavicular lymph node suggesting metastatic disease. There is Allan Minotti lytic lesion in right glenoid consistent with skeletal metastatic disease. Electronically Signed   By: Elmer Picker M.D.   On: 02/06/2022 10:23   Addendum Date: 02/05/2022   ADDENDUM REPORT: 02/05/2022 16:15 ADDENDUM: Images were reviewed with additional clinical history. In the previous CT chest done on 10/01/2020, there is 1.5 cm pleural-based noncalcified nodule in the medial left upper lobe. In view  of this prior finding, imaging findings in the left lung in the current study most likely suggest extensive malignant neoplastic process with lymphangitic spread of metastatic disease throughout left lung. Nodular densities in the right lung suggest metastatic disease. Enlarged lymph nodes in mediastinum suggest metastatic lymphadenopathy. Follow-up PET-CT and tissue sampling should be considered. Electronically Signed   By: Elmer Picker M.D.   On: 02/05/2022 16:15   Result Date: 02/06/2022 CLINICAL DATA:  Pneumonia EXAM: CT CHEST WITH CONTRAST TECHNIQUE: Multidetector CT imaging of the chest was performed during intravenous contrast  administration. RADIATION DOSE REDUCTION: This exam was performed according to the departmental dose-optimization program which includes automated exposure control, adjustment of the mA and/or kV according to patient size and/or use of iterative reconstruction technique. CONTRAST:  30mL OMNIPAQUE IOHEXOL 350 MG/ML SOLN COMPARISON:  Previous CT done on 10/01/2020 and chest radiographs done today FINDINGS: Cardiovascular: There is homogeneous enhancement in thoracic aorta. There are no intraluminal filling defects in central pulmonary artery branches. Mediastinum/Nodes: There are enlarged lymph nodes in mediastinum and hilar regions. There is Marlan Steward precarinal node measuring 1.3 cm in short axis. There is subcarinal node measuring 1.4 cm in short axis. There is 2 cm node in the inferior right hilum. Evaluation of left hilum for lymphadenopathy is limited by dense adjacent infiltrates. Lungs/Pleura: There is large dense infiltrate in left upper lobe. There is 7 cm slightly inhomogeneous attenuation in the left apex. There is 5.5 cm density in the anteromedial left upper lobe. There is Halen Antenucci 3.9 cm opacity in the left parahilar region and left lower lobe. There are other extensive patchy infiltrates in left upper lobe and left lower lobe. There is interval clearing of ground-glass densities in right lung. In image 116 of series 4, there is Auburn Hester new 1 cm nodule with spiculated margins in right lower lobe. In [place] one hundred 20, there is 7 mm pleural-based density in posterior right lower lung field. In image 139, there is 6 mm nodular density in the posterior right lower lobe. Small left pleural effusion is seen. There is no pneumothorax. Blebs and bullae are seen in the right apex. Upper Abdomen: There is fatty infiltration of the liver. Musculoskeletal: No focal lytic or sclerotic lesions are seen. IMPRESSION: There is interval appearance of large infiltrate in left upper lobe suggesting pneumonia. There are areas of  homogeneous opacity without aeration in left upper lobe. Possibility of underlying malignant neoplastic processes not excluded. Extensive patchy infiltrates are seen in left lower lobe including 3.9 cm homogeneous opacity in the inferior left hilum. Small left pleural effusion is seen. There are enlarged lymph nodes in mediastinum and hilar regions suggesting infectious or neoplastic process. There are few nodular densities in right lower lobe largest measuring 10 mm in size with spiculated margins. This finding may suggest small foci of pneumonia or neoplastic process. Short-term follow-up CT after antibiotic treatment along with PET-CT if warranted should be considered. Electronically Signed: By: Elmer Picker M.D. On: 02/05/2022 14:29   MR SHOULDER RIGHT WO CONTRAST  Result Date: 02/05/2022 CLINICAL DATA:  Right shoulder pain. Destructive lesion involving the glenoid noted on recent CT scan. EXAM: MRI OF THE RIGHT SHOULDER WITHOUT CONTRAST TECHNIQUE: Multiplanar, multisequence MR imaging of the shoulder was performed. No intravenous contrast was administered. COMPARISON:  Chest CT 02/05/2022 FINDINGS: Rotator cuff: The rotator cuff tendons are intact. Mild tendinopathy but no full-thickness retracted tear. Muscles:  No significant findings. Biceps long head:  Intact Acromioclavicular Joint: Mild degenerative changes. Type  2-3 acromion. Significant anterior downsloping of the acromion. No lateral downsloping or subacromial spurring. Glenohumeral Joint: There is Teruko Joswick very aggressive destructive bone lesions involving the scapula, most notably in the glenoid area but also involving the coracoid process and part of the scapular body. The bone is largely destroyed and there is Lakysha Kossman large associated soft tissue mass measuring approximately 5.0 x 4.5 cm. Osteomyelitis is Tniyah Nakagawa consideration but I would expect to see severe surrounding myofasciitis and cellulitis which really is not present. Also, I would expect there  would be septic arthritis and involvement of the humeral head which there is not. Biopsy is recommended. Labrum:  Degenerated and likely torn. Bones: Destructive scapular bone lesion as detailed above. No other bony structures are in. Other: No subacromial/subdeltoid fluid collections to suggest bursitis. Enlarged right axillary lymph node is noted. IMPRESSION: 1. Destructive scapular bone lesion with associated large soft tissue mass measuring approximately 5.0 x 4.5 cm. Although osteomyelitis is Candon Caras consideration I think it is unlikely and more likely Deen Deguia neoplastic process. 2. Biopsy is recommended. 3. Intact rotator cuff tendons and long head biceps tendon. Electronically Signed   By: Marijo Sanes M.D.   On: 02/05/2022 20:09   DG Shoulder Right Port  Addendum Date: 02/05/2022   ADDENDUM REPORT: 02/05/2022 16:27 ADDENDUM: The original report was by Dr. Van Clines. The following addendum is by Dr. Van Clines: These results were called by telephone at the time of interpretation on 02/05/2022 at 4:24 pm to provider [facility] , who verbally acknowledged these results. Electronically Signed   By: Van Clines M.D.   On: 02/05/2022 16:27   Result Date: 02/05/2022 CLINICAL DATA:  Coughing.  Right shoulder pain. EXAM: RIGHT SHOULDER - 2 VIEWS COMPARISON:  Chest radiograph 02/05/2022 FINDINGS: Permeative lytic process in the right glenoid. The proximal humerus and distal clavicle appear intact. IMPRESSION: 1. Permeative lytic lesion in the right glenoid favoring osteomyelitis given the concomitant extensive left pneumonia. Septic right glenohumeral joint not excluded. Radiology assistant personnel have been notified to put me in telephone contact with the referring physician or the referring physician's clinical representative in order to discuss these findings. Once this communication is established I will issue an addendum to this report for documentation purposes. Electronically Signed: By:  Van Clines M.D. On: 02/05/2022 16:21   DG Chest 2 View  Result Date: 02/05/2022 CLINICAL DATA:  Chronic for 1 month.  Right shoulder pain. EXAM: CHEST - 2 VIEW COMPARISON:  Chest two views 09/30/2020, CT chest 10/01/2020 FINDINGS: There is mild high-grade heterogeneous airspace opacification throughout the mid and superior aspects of the left lung with diffuse left lung interstitial thickening also extending into the inferior left lung. More homogeneous opacification of the left lung apex, possible pleural fluid versus airspace opacity. The right lung is clear. No inferior layering pleural effusion is seen. No pneumothorax. The cardiac silhouette is partially obscured but does not appear enlarged. Mediastinal contours are not well evaluated. No acute skeletal abnormality. IMPRESSION: Heterogeneous airspace opacification of the mid to upper left lung with diffuse left lung interstitial thickening. Findings are suspicious for high-grade pneumonia. Note is made on prior radiographs and CT there was clinical concern for ground-glass, multifocal atypical pneumonia. Is this patient immunocompromised? Electronically Signed   By: Yvonne Kendall M.D.   On: 02/05/2022 09:50    Microbiology: Recent Results (from the past 240 hour(s))  Blood culture (routine x 2)     Status: None (Preliminary result)   Collection Time: 02/05/22 10:57  AM   Specimen: BLOOD  Result Value Ref Range Status   Specimen Description BLOOD SITE NOT SPECIFIED  Final   Special Requests   Final    BOTTLES DRAWN AEROBIC AND ANAEROBIC Blood Culture adequate volume   Culture   Final    NO GROWTH 2 DAYS Performed at [facility], [address], [place], [place] [address]    Report Status PENDING  Incomplete  Blood culture (routine x 2)     Status: None (Preliminary result)   Collection Time: 02/05/22 11:02 AM   Specimen: BLOOD  Result Value Ref Range Status   Specimen Description BLOOD RIGHT ANTECUBITAL  Final   Special  Requests   Final    BOTTLES DRAWN AEROBIC AND ANAEROBIC Blood Culture results may not be optimal due to an inadequate volume of blood received in culture bottles   Culture   Final    NO GROWTH 2 DAYS Performed at [facility], [place] [address] [place], [place] [address]    Report Status PENDING  Incomplete  SARS Coronavirus 2 by RT PCR (hospital order, performed in [facility] hospital lab) *cepheid single result test* Anterior Nasal Swab     Status: None   Collection Time: 02/05/22 12:41 PM   Specimen: Anterior Nasal Swab  Result Value Ref Range Status   SARS Coronavirus 2 by RT PCR NEGATIVE NEGATIVE Final    Comment: (NOTE) SARS-CoV-2 target nucleic acids are NOT DETECTED.  The SARS-CoV-2 RNA is generally detectable in upper and lower respiratory specimens during the acute phase of infection. The lowest concentration of SARS-CoV-2 viral copies this assay can detect is 250 copies / mL. Donavyn Fecher negative result does not preclude SARS-CoV-2 infection and should not be used as the sole basis for treatment or other patient management decisions.  Jacinto Keil negative result may occur with improper specimen collection / handling, submission of specimen other than nasopharyngeal swab, presence of viral mutation(s) within the areas targeted by this assay, and inadequate number of viral copies (<250 copies / mL). Marvie Brevik negative result must be combined with clinical observations, patient history, and epidemiological information.  Fact Sheet for Patients:   https://www.patel.info/  Fact Sheet for Healthcare Providers: https://hall.com/  This test is not yet approved or  cleared by the [place] FDA and has been authorized for detection and/or diagnosis of SARS-CoV-2 by FDA under an Emergency Use Authorization (EUA).  This EUA will remain in effect (meaning this test can be used) for the duration of the COVID-19 declaration under Section 564(b)(1) of the Act,  21 U.S.C. section 360bbb-3(b)(1), unless the authorization is terminated or revoked sooner.  Performed at [facility], [place] [address], [place], [place] [address]   MRSA Next Gen by PCR, Nasal     Status: None   Collection Time: 02/05/22  9:45 PM   Specimen: Nasal Mucosa; Nasal Swab  Result Value Ref Range Status   MRSA by PCR Next Gen NOT DETECTED NOT DETECTED Final    Comment: (NOTE) The GeneXpert MRSA Assay (FDA approved for NASAL specimens only), is one component of Jadee Golebiewski comprehensive MRSA colonization surveillance program. It is not intended to diagnose MRSA infection nor to guide or monitor treatment for MRSA infections. Test performance is not FDA approved in patients less than 47 years old. Performed at [facility], [place] [address] [place], [place] [address]      Labs: Basic Metabolic Panel: Recent Labs  Lab 02/05/22 1119 02/06/22 0258 02/07/22 2841  NA 140 137 136  K 4.0 4.4 4.3  CL 104 107 104  CO2 26 23 24   GLUCOSE 93 79 99  BUN 16 16 13   CREATININE 0.98 0.93 0.90  CALCIUM 9.0 8.0* 8.3*  MG  --   --  2.1  PHOS  --   --  3.5   Liver Function Tests: Recent Labs  Lab 02/05/22 1119 02/07/22 0342  AST 18 15  ALT 13 10  ALKPHOS 62 53  BILITOT 0.4 0.3  PROT 7.2 6.5  ALBUMIN 2.6* 2.2*   No results for input(s): "LIPASE", "AMYLASE" in the last 168 hours. No results for input(s): "AMMONIA" in the last 168 hours. CBC: Recent Labs  Lab 02/05/22 1119 02/06/22 0258 02/07/22 0342  WBC 8.7 8.9 9.1  NEUTROABS 6.3  --  6.5  HGB 13.0 11.4* 12.0*  HCT 41.8 35.6* 39.7  MCV 82.9 81.5 86.5  PLT 560* 476* 622*   Cardiac Enzymes: No results for input(s): "CKTOTAL", "CKMB", "CKMBINDEX", "TROPONINI" in the last 168 hours. BNP: BNP (last 3 results) No results for input(s): "BNP" in the last 8760 hours.  ProBNP (last 3 results) No results for input(s): "PROBNP" in the last 8760 hours.  CBG: Recent Labs  Lab 02/07/22 1133  GLUCAP 117*        Signed:  Fayrene Helper MD.  [facility] 02/07/2022, 2:05 PM

## 2022-02-07 NOTE — TOC Transition Note (Signed)
Transition of Care [facility]) - CM/SW Discharge Note   Patient Details  Name: Steven Johnston MRN: [id number] Date of Birth: 05-Dec-1960  [facility]) CM/SW Contact:  Verdell Carmine, RN Phone Number: 02/07/2022, 2:40 PM   Clinical Narrative:     Patient going home, positive for small cell lung carcinoma, has FU with ID and oncology  Final next level of care: Home/Self Care Barriers to Discharge: No Barriers Identified   Patient Goals and CMS Choice        Discharge Placement  Home self care                     Discharge Plan and Services                                     Social Determinants of Health (SDOH) Interventions     Readmission Risk Interventions     No data to display

## 2022-02-07 NOTE — Progress Notes (Signed)
Discharge instructions reviewed with pt. Copy of instructions given to pt. Scripts filled by [facility] and given to pt. Pt verbalizes understanding, and knows when to follow up with MDs.  Pt to be d/c'd via wheelchair with belongings, ride coming to front entrance.              Wil be Escorted by staff.

## 2022-02-10 LAB — CULTURE, BLOOD (ROUTINE X 2)
Culture: NO GROWTH
Culture: NO GROWTH
Special Requests: ADEQUATE

## 2022-02-10 LAB — LEGIONELLA PNEUMOPHILA SEROGP 1 UR AG: L. pneumophila Serogp 1 Ur Ag: NEGATIVE

## 2022-02-10 LAB — SURGICAL PATHOLOGY

## 2022-02-11 ENCOUNTER — Encounter: Payer: Self-pay | Admitting: Infectious Diseases

## 2022-02-11 ENCOUNTER — Ambulatory Visit (INDEPENDENT_AMBULATORY_CARE_PROVIDER_SITE_OTHER): Payer: Commercial Managed Care - HMO | Admitting: Infectious Diseases

## 2022-02-11 ENCOUNTER — Other Ambulatory Visit: Payer: Self-pay

## 2022-02-11 ENCOUNTER — Telehealth: Payer: Self-pay | Admitting: Internal Medicine

## 2022-02-11 VITALS — BP 105/72 | HR 61 | Resp 16 | Ht 69.0 in | Wt 121.0 lb

## 2022-02-11 DIAGNOSIS — B20 Human immunodeficiency virus [HIV] disease: Secondary | ICD-10-CM | POA: Diagnosis not present

## 2022-02-11 DIAGNOSIS — Z0000 Encounter for general adult medical examination without abnormal findings: Secondary | ICD-10-CM

## 2022-02-11 DIAGNOSIS — F17210 Nicotine dependence, cigarettes, uncomplicated: Secondary | ICD-10-CM | POA: Diagnosis not present

## 2022-02-11 DIAGNOSIS — C3490 Malignant neoplasm of unspecified part of unspecified bronchus or lung: Secondary | ICD-10-CM

## 2022-02-11 DIAGNOSIS — F17200 Nicotine dependence, unspecified, uncomplicated: Secondary | ICD-10-CM

## 2022-02-11 NOTE — Progress Notes (Incomplete)
Patient Active Problem List   Diagnosis Date Noted   Metastatic disease ([place]) 02/06/2022   Adenopathy    Pneumonia of left lung due to infectious organism 02/05/2022   Thrombocytosis 02/05/2022   Weight loss 02/05/2022   Lung mass    Right shoulder pain 01/18/2022   Hepatitis B infection without delta agent without hepatic coma 01/18/2022   Cancer screening 09/10/2021   Need for immunization against influenza 05/03/2021   Protein-calorie malnutrition ([place]) 05/03/2021   Tobacco use 05/03/2021   Immunization counseling 01/21/2021   Screening for STDs (sexually transmitted diseases) 10/16/2020   Dental caries 10/16/2020   Unspecified severe protein-calorie malnutrition ([place])    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   BICTEGRAVIR-EMTRICITABINE-TENOFOVIR AF (BIKTARVY) 50-200-25 MG TABS TABLET    Take 1 tablet by mouth daily.   DICLOFENAC SODIUM (VOLTAREN) 1 % GEL    Apply 2 g topically 4 (four) times daily.   NICOTINE (NICODERM CQ - DOSED IN MG/24 HOURS) 14 MG/24HR PATCH    Place 1 patch (14 mg total) onto the skin daily.   OXYCODONE (ROXICODONE) 5 MG IMMEDIATE RELEASE TABLET    Take 1 tablet (5 mg total) by mouth every 8 (eight) hours as needed.  Modified Medications   No medications on file  Discontinued Medications   No medications on file    Subjective: Coughs, phlegm is turning yellow from green. Denies fevers, chills and sweats. Denies nausea, vomiting and diarrhea. He got a call from Oncology office today and says he has to call them back later today for appointment. Appetite is OK< Still hasa rt shoulder pain. Has stopped smoking and on nicotine patch    Review of Systems: ROS  Past Medical History:  Diagnosis Date   Hernia, inguinal, right    HIV (human immunodeficiency virus infection) ([place])     Social History   Tobacco Use   Smoking status: Every Day    Packs/day: 0.30    Types: Cigarettes   Smokeless tobacco: Never  Substance Use Topics   Alcohol use: Yes    Comment: for celebrations   Drug use: No    Family History  Problem Relation Age of Onset   Cancer Brother    Immunodeficiency Neg Hx     No Known Allergies  Health Maintenance  Topic Date Due   COVID-19 Vaccine (1) Never done   TETANUS/TDAP  Never done   Zoster Vaccines- Shingrix (1 of 2) Never done   COLONOSCOPY (Pts 45-76yrs Insurance coverage will need to be confirmed)  Never done   INFLUENZA VACCINE  12/31/2021   Hepatitis C Screening  Completed   HIV Screening  Completed   HPV VACCINES  Aged Out    Objective:  There were no vitals filed for this visit. There is no height or weight on file to calculate BMI.  Physical Exam Constitutional:      Appearance: Normal appearance.  HENT:     Head: Normocephalic and atraumatic.      Mouth: Mucous membranes are moist.  Eyes:    Conjunctiva/sclera: Conjunctivae normal.     Pupils: Pupils are equal, round, and reactive  to light.   Cardiovascular:     Rate and Rhythm: Normal rate and regular rhythm.     Heart sounds: No murmur heard. No friction rub. No gallop.   Pulmonary:     Effort: Pulmonary effort is normal.     Breath sounds: Normal breath sounds.   Abdominal:     General: Non distended     Palpations: soft.   Musculoskeletal:        General: Normal range of motion.   Skin:    General: Skin is warm and dry.     Comments:  Neurological:     General: grossly non focal     Mental Status: awake, alert and oriented to person, place, and time.   Psychiatric:        Mood and Affect: Mood normal.   Lab Results Lab Results  Component Value Date   WBC 9.1 02/07/2022   HGB 12.0 (L)  02/07/2022   HCT 39.7 02/07/2022   MCV 86.5 02/07/2022   PLT 622 (H) 02/07/2022    Lab Results  Component Value Date   CREATININE 0.90 02/07/2022   BUN 13 02/07/2022   NA 136 02/07/2022   K 4.3 02/07/2022   CL 104 02/07/2022   CO2 24 02/07/2022    Lab Results  Component Value Date   ALT 10 02/07/2022   AST 15 02/07/2022   ALKPHOS 53 02/07/2022   BILITOT 0.3 02/07/2022    Lab Results  Component Value Date   CHOL 132 01/17/2022   HDL 34 (L) 01/17/2022   LDLCALC 84 01/17/2022   TRIG 64 01/17/2022   CHOLHDL 3.9 01/17/2022   Lab Results  Component Value Date   LABRPR NON-REACTIVE 01/17/2022   HIV 1 RNA Quant  Date Value  01/17/2022 <20 DETECTED copies/mL (A)  08/22/2021 29 Copies/mL (H)  05/03/2021 82 copies/mL (H)   CD4 T Cell Abs (/uL)  Date Value  08/22/2021 209 (L)  01/21/2021 165 (L)  09/30/2020 39 (L)     Problem List Items Addressed This Visit   None   I have personally spent more than 70 minutes involved in face-to-face and non-face-to-face activities for this patient on the day of the visit. Professional time spent includes the following activities: Preparing to see the patient (review of tests), Obtaining and/or reviewing separately obtained history (admission/discharge record), Performing a medically appropriate examination and/or evaluation , Ordering medications/tests/procedures, referring and communicating with other health care professionals, Documenting clinical information in the EMR, Independently interpreting results (not separately reported), Communicating results to the patient/family/caregiver, Counseling and educating the patient/family/caregiver and Care coordination (not separately reported).   Wilber Oliphant, [facility] 02/11/2022, 11:31 AM

## 2022-02-11 NOTE — Telephone Encounter (Signed)
Scheduled appt per 9/11 referral. Pt is aware of appt date and time. Pt is aware to arrive 15 mins prior to appt time and to bring and updated insurance card. Pt is aware of appt location.   

## 2022-02-12 ENCOUNTER — Inpatient Hospital Stay (HOSPITAL_BASED_OUTPATIENT_CLINIC_OR_DEPARTMENT_OTHER): Payer: Commercial Managed Care - HMO | Admitting: Internal Medicine

## 2022-02-12 ENCOUNTER — Other Ambulatory Visit: Payer: Self-pay

## 2022-02-12 ENCOUNTER — Inpatient Hospital Stay: Payer: Commercial Managed Care - HMO

## 2022-02-12 VITALS — BP 102/67 | HR 84 | Temp 97.6°F | Resp 16 | Wt 121.6 lb

## 2022-02-12 DIAGNOSIS — K760 Fatty (change of) liver, not elsewhere classified: Secondary | ICD-10-CM | POA: Insufficient documentation

## 2022-02-12 DIAGNOSIS — Z791 Long term (current) use of non-steroidal anti-inflammatories (NSAID): Secondary | ICD-10-CM | POA: Insufficient documentation

## 2022-02-12 DIAGNOSIS — C3431 Malignant neoplasm of lower lobe, right bronchus or lung: Secondary | ICD-10-CM | POA: Insufficient documentation

## 2022-02-12 DIAGNOSIS — M25511 Pain in right shoulder: Secondary | ICD-10-CM | POA: Insufficient documentation

## 2022-02-12 DIAGNOSIS — R918 Other nonspecific abnormal finding of lung field: Secondary | ICD-10-CM

## 2022-02-12 DIAGNOSIS — R059 Cough, unspecified: Secondary | ICD-10-CM | POA: Insufficient documentation

## 2022-02-12 DIAGNOSIS — G893 Neoplasm related pain (acute) (chronic): Secondary | ICD-10-CM | POA: Diagnosis not present

## 2022-02-12 DIAGNOSIS — C3490 Malignant neoplasm of unspecified part of unspecified bronchus or lung: Secondary | ICD-10-CM | POA: Insufficient documentation

## 2022-02-12 DIAGNOSIS — F17210 Nicotine dependence, cigarettes, uncomplicated: Secondary | ICD-10-CM | POA: Insufficient documentation

## 2022-02-12 DIAGNOSIS — Z510 Encounter for antineoplastic radiation therapy: Secondary | ICD-10-CM | POA: Diagnosis present

## 2022-02-12 DIAGNOSIS — Z21 Asymptomatic human immunodeficiency virus [HIV] infection status: Secondary | ICD-10-CM | POA: Insufficient documentation

## 2022-02-12 DIAGNOSIS — Z5111 Encounter for antineoplastic chemotherapy: Secondary | ICD-10-CM

## 2022-02-12 DIAGNOSIS — Z809 Family history of malignant neoplasm, unspecified: Secondary | ICD-10-CM | POA: Insufficient documentation

## 2022-02-12 DIAGNOSIS — C7951 Secondary malignant neoplasm of bone: Secondary | ICD-10-CM | POA: Insufficient documentation

## 2022-02-12 DIAGNOSIS — Z800 Family history of malignant neoplasm of digestive organs: Secondary | ICD-10-CM | POA: Insufficient documentation

## 2022-02-12 DIAGNOSIS — Z0000 Encounter for general adult medical examination without abnormal findings: Secondary | ICD-10-CM | POA: Insufficient documentation

## 2022-02-12 DIAGNOSIS — C3412 Malignant neoplasm of upper lobe, left bronchus or lung: Secondary | ICD-10-CM | POA: Diagnosis not present

## 2022-02-12 DIAGNOSIS — F17200 Nicotine dependence, unspecified, uncomplicated: Secondary | ICD-10-CM | POA: Insufficient documentation

## 2022-02-12 LAB — CBC WITH DIFFERENTIAL (CANCER CENTER ONLY)
Abs Immature Granulocytes: 0.03 10*3/uL (ref 0.00–0.07)
Basophils Absolute: 0 10*3/uL (ref 0.0–0.1)
Basophils Relative: 0 %
Eosinophils Absolute: 0.6 10*3/uL — ABNORMAL HIGH (ref 0.0–0.5)
Eosinophils Relative: 6 %
HCT: 38.4 % — ABNORMAL LOW (ref 39.0–52.0)
Hemoglobin: 12.3 g/dL — ABNORMAL LOW (ref 13.0–17.0)
Immature Granulocytes: 0 %
Lymphocytes Relative: 13 %
Lymphs Abs: 1.2 10*3/uL (ref 0.7–4.0)
MCH: 26 pg (ref 26.0–34.0)
MCHC: 32 g/dL (ref 30.0–36.0)
MCV: 81.2 fL (ref 80.0–100.0)
Monocytes Absolute: 1 10*3/uL (ref 0.1–1.0)
Monocytes Relative: 10 %
Neutro Abs: 7 10*3/uL (ref 1.7–7.7)
Neutrophils Relative %: 71 %
Platelet Count: 463 10*3/uL — ABNORMAL HIGH (ref 150–400)
RBC: 4.73 MIL/uL (ref 4.22–5.81)
RDW: 14.2 % (ref 11.5–15.5)
WBC Count: 9.9 10*3/uL (ref 4.0–10.5)
nRBC: 0 % (ref 0.0–0.2)

## 2022-02-12 LAB — CMP (CANCER CENTER ONLY)
ALT: 11 U/L (ref 0–44)
AST: 15 U/L (ref 15–41)
Albumin: 3 g/dL — ABNORMAL LOW (ref 3.5–5.0)
Alkaline Phosphatase: 57 U/L (ref 38–126)
Anion gap: 4 — ABNORMAL LOW (ref 5–15)
BUN: 12 mg/dL (ref 8–23)
CO2: 31 mmol/L (ref 22–32)
Calcium: 9 mg/dL (ref 8.9–10.3)
Chloride: 100 mmol/L (ref 98–111)
Creatinine: 0.84 mg/dL (ref 0.61–1.24)
GFR, Estimated: >60 mL/min (ref >60)
Glucose, Bld: 105 mg/dL — ABNORMAL HIGH (ref 70–99)
Potassium: 4.1 mmol/L (ref 3.5–5.1)
Sodium: 135 mmol/L (ref 135–145)
Total Bilirubin: 0.3 mg/dL (ref 0.3–1.2)
Total Protein: 6.7 g/dL (ref 6.5–8.1)

## 2022-02-12 NOTE — Progress Notes (Signed)
[facility] [phone]   [phone]  CONSULT NOTE  REFERRING PHYSICIAN: Dr. Ina Homes  REASON FOR CONSULTATION:  61 years old African-American male recently diagnosed with lung cancer  HPI Steven Johnston is a 61 y.o. male with past medical history significant for HIV as well as right inguinal hernia repair and long history of smoking.  The patient mentions that he had a hernia repair in 2019 and during that time he was discovered to have HIV.  He is followed by the infectious disease center by Dr. Rosiland Oz.  He is very compliant with his medication according to him.  He was diagnosed in May 2022 with suspicious pneumonia and treated at that time.  Recently the patient presented to the emergency department on 02/05/2022 complaining of cough as well as shortness of breath and pain on the right shoulder.  Chest x-ray on 02/05/2022 showed heterogeneous airspace with spiculation of the mid to upper left lung with diffuse left lung interstitial thickening suspicious for pneumonia but x-ray of the right shoulder showed brain atrophy lytic lesion in the right glenoid favoring osteomyelitis.  MRI of the right shoulder on 02/05/2022 showed destructive scapular bone lesion with associated large soft tissue mass measuring approximately 5.0 x 4.5 cm.  The patient had CT scan of the chest with contrast on 02/05/2022 and that showed large dense infiltrate in the left upper lobe.  There was 7.0 cm slightly and homogeneous attenuation in the left apex and there is 5.5 cm density in the anterior medial left upper lobe.  There was also a 3.9 cm opacity in the left para hilar region and left lower lobe.  There was also new 1.0 cm nodule with spiculated margins in the right lower lobe and there was 0.6 cm nodular density in the posterior right lower lobe and a small left pleural effusion.  The scan also showed enlarged lymph nodes in the mediastinum and hilar region and there was a  precarinal node measuring 1.3 cm in short axis and subcarinal node measuring 1.4 cm in addition to 2.0 cm node in the inferior right hilum.  The scan also showed 3.0 cm left supraclavicular lymph node suggesting metastatic disease and a lytic lesion in the right glenoid consistent with skeletal metastatic disease. The patient was seen by Dr. Ina Homes and he ordered ultrasound guided core biopsy of the left supraclavicular lymph node which was done on 02/06/2022.  The final pathology ([id number]) showed poorly differentiated non-small cell carcinoma. Immunohistochemistry  is positive with TTF-1 and negative with cytokeratin 5/6 and p63 consistent with primary lung adenocarcinoma. Dr. Tamala Julian kindly referred the patient to me today for evaluation and recommendation regarding treatment of his condition. When seen today he continues to complain of dry cough as well as shortness of breath increased with exertion but no significant chest pain or hemoptysis.  He has no nausea, vomiting, diarrhea but has constipation.  He continues to have cold sweats and he lost around 20 pounds in the last few months.  He has no headache or visual changes. Family history significant for brother with pancreatic cancer.  Mother still alive at age 77.  Sister had cancer of unknown type. The patient is single and has 2 children a son who is 51 and a daughter who is 87.  He has a history for smoking for around 40 years and unfortunately continues to smoke few cigarettes every day.  He drinks alcohol occasionally and no recent history of drug  abuse but he did it in the past.  HPI  Past Medical History:  Diagnosis Date   Hernia, inguinal, right    HIV (human immunodeficiency virus infection) ([place])     Past Surgical History:  Procedure Laterality Date   BRONCHIAL WASHINGS  10/05/2020   Procedure: BRONCHIAL WASHINGS;  Surgeon: Julian Hy, DO;  Location: [facility];  Service: Endoscopy;;   INGUINAL HERNIA REPAIR Right  02/17/2018   Procedure: OPEN HERNIA REPAIR RIGHT INGUINAL INCARCERATED WITH DIAGONSTIC LAPAROSCOPY FOR INCARCERATED HERNIA;  Surgeon: Ileana Roup, MD;  Location: [facility];  Service: General;  Laterality: Right;   VIDEO BRONCHOSCOPY N/A 10/05/2020   Procedure: VIDEO BRONCHOSCOPY WITHOUT FLUORO;  Surgeon: Julian Hy, DO;  Location: [facility];  Service: Endoscopy;  Laterality: N/A;    Family History  Problem Relation Age of Onset   Cancer Brother    Immunodeficiency Neg Hx     Social History Social History   Tobacco Use   Smoking status: Every Day    Packs/day: 0.30    Types: Cigarettes   Smokeless tobacco: Never  Substance Use Topics   Alcohol use: Yes    Comment: for celebrations   Drug use: No    No Known Allergies  Current Outpatient Medications  Medication Sig Dispense Refill   albuterol (VENTOLIN HFA) 108 (90 Base) MCG/ACT inhaler Inhale 2 puffs into the lungs every 6 (six) hours as needed for wheezing or shortness of breath. 6.7 g 2   bictegravir-emtricitabine-tenofovir AF (BIKTARVY) 50-200-25 MG TABS tablet Take 1 tablet by mouth daily. 30 tablet 5   diclofenac Sodium (VOLTAREN) 1 % GEL Apply 2 g topically 4 (four) times daily. 100 g 0   nicotine (NICODERM CQ - DOSED IN MG/24 HOURS) 14 mg/24hr patch Place 1 patch (14 mg total) onto the skin daily. 28 patch 0   oxyCODONE (ROXICODONE) 5 MG immediate release tablet Take 1 tablet (5 mg total) by mouth every 8 (eight) hours as needed. 20 tablet 0   No current facility-administered medications for this visit.    Review of Systems  Constitutional: positive for fatigue and weight loss Eyes: negative Ears, nose, mouth, throat, and face: negative Respiratory: positive for cough and dyspnea on exertion Cardiovascular: negative Gastrointestinal: positive for constipation Genitourinary:negative Integument/breast: negative Hematologic/lymphatic: negative Musculoskeletal:positive for bone pain Neurological:  negative Behavioral/Psych: negative Endocrine: negative Allergic/Immunologic: negative  Physical Exam  HDQ:QIWLN, healthy, no distress, anxious, and malnourished SKIN: skin color, texture, turgor are normal, no rashes or significant lesions HEAD: Normocephalic, No masses, lesions, tenderness or abnormalities EYES: normal, PERRLA, Conjunctiva are pink and non-injected EARS: External ears normal, Canals clear OROPHARYNX:no exudate, no erythema, and lips, buccal mucosa, and tongue normal  NECK: supple, no adenopathy, no JVD LYMPH:  no palpable lymphadenopathy, no hepatosplenomegaly LUNGS: clear to auscultation , and palpation HEART: regular rate & rhythm, no murmurs, and no gallops ABDOMEN:abdomen soft, non-tender, normal bowel sounds, and no masses or organomegaly BACK: Back symmetric, no curvature., No CVA tenderness EXTREMITIES:no joint deformities, effusion, or inflammation, no edema  NEURO: alert & oriented x 3 with fluent speech, no focal motor/sensory deficits  PERFORMANCE STATUS: ECOG 1  LABORATORY DATA: Lab Results  Component Value Date   WBC 9.9 02/12/2022   HGB 12.3 (L) 02/12/2022   HCT 38.4 (L) 02/12/2022   MCV 81.2 02/12/2022   PLT 463 (H) 02/12/2022      Chemistry      Component Value Date/Time   NA 136 02/07/2022 0342   K  4.3 02/07/2022 0342   CL 104 02/07/2022 0342   CO2 24 02/07/2022 0342   BUN 13 02/07/2022 0342   CREATININE 0.90 02/07/2022 0342   CREATININE 1.08 08/22/2021 1346      Component Value Date/Time   CALCIUM 8.3 (L) 02/07/2022 0342   ALKPHOS 53 02/07/2022 0342   AST 15 02/07/2022 0342   ALT 10 02/07/2022 0342   BILITOT 0.3 02/07/2022 0342       RADIOGRAPHIC STUDIES: DG CHEST PORT 1 VIEW  Result Date: 02/06/2022 CLINICAL DATA:  61 year old male presenting for evaluation of cough and shoulder pain. EXAM: PORTABLE CHEST 1 VIEW COMPARISON:  February 05, 2022. FINDINGS: Interstitial thickening in the chest similar to previous imaging.  Opacity in the LEFT upper chest similar to previous imaging in this patient with known LEFT upper lobe pulmonary mass. Diminished volume in the LEFT chest likely related underlying neoplasm and interstitial changes. Diffuse airspace process throughout aerated portions of LEFT lung is also similar. RIGHT chest is largely clear with the exception of potential new subtle opacities at the RIGHT lung base since previous imaging. Small amount of gas in the LEFT supraclavicular fossa likely related to recent biopsy. On limited assessment no acute skeletal findings. IMPRESSION: 1. 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 Correlate with respiratory symptoms. 2. Subtle basilar opacities on the RIGHT better seen on chest CT compatible with known pulmonary nodules in this location. No new areas of lobar consolidation. 3. Postprocedural changes of biopsy in the LEFT supraclavicular region. Electronically Signed   By: Zetta Bills M.D.   On: 02/06/2022 11:54   [place] CORE BIOPSY (LYMPH NODES)  Result Date: 02/06/2022 INDICATION: Concern for metastatic lung cancer please perform ultrasound-guided biopsy of left supraclavicular nodal mass for tissue diagnostic purposes. EXAM: ULTRASOUND-GUIDED LEFT SUPRACLAVICULAR LYMPH NODE BIOPSY COMPARISON:  Chest CT-02/05/2022 MEDICATIONS: None ANESTHESIA/SEDATION: None COMPLICATIONS: None immediate. TECHNIQUE: Informed written consent was obtained from the patient after a discussion of the risks, benefits and alternatives to treatment. Questions regarding the procedure were encouraged and answered. Initial ultrasound scanning demonstrated bulky left supraclavicular nodal conglomeration with dominant component measuring approximately 3.7 x 3.0 cm, similar to preceding chest CT image 22, series 3. An ultrasound image was saved for documentation purposes. The  procedure was planned. A timeout was performed prior to the initiation of the procedure. The operative was prepped and draped in the usual sterile fashion, and a sterile drape was applied covering the operative field. A timeout was performed prior to the initiation of the procedure. Local anesthesia was provided with 1% lidocaine with epinephrine. Under direct ultrasound guidance, an 18 gauge core needle device was utilized to obtain to obtain 6 core needle biopsies of the left supraclavicular nodal conglomeration. The samples were placed in saline and submitted to pathology. The needle was removed and hemostasis was achieved with manual compression. Post procedure scan was negative for significant hematoma. A dressing was applied. The patient tolerated the procedure well without immediate postprocedural complication. IMPRESSION: Technically successful ultrasound guided biopsy of dominant left supraclavicular nodal conglomeration lymph node. Electronically Signed   By: Sandi Mariscal M.D.   On: 02/06/2022 10:37   CT CHEST W CONTRAST  Addendum Date: 02/06/2022   ADDENDUM REPORT: 02/06/2022 10:23 ADDENDUM: Images were reviewed again. There is 3 cm left supraclavicular lymph node suggesting metastatic disease. There is a lytic lesion in right glenoid consistent with skeletal metastatic disease. Electronically  Signed   By: Elmer Picker M.D.   On: 02/06/2022 10:23   Addendum Date: 02/05/2022   ADDENDUM REPORT: 02/05/2022 16:15 ADDENDUM: Images were reviewed with additional clinical history. In the previous CT chest done on 10/01/2020, there is 1.5 cm pleural-based noncalcified nodule in the medial left upper lobe. In view of this prior finding, imaging findings in the left lung in the current study most likely suggest extensive malignant neoplastic process with lymphangitic spread of metastatic disease throughout left lung. Nodular densities in the right lung suggest metastatic disease. Enlarged lymph nodes in  mediastinum suggest metastatic lymphadenopathy. Follow-up PET-CT and tissue sampling should be considered. Electronically Signed   By: Elmer Picker M.D.   On: 02/05/2022 16:15   Result Date: 02/06/2022 CLINICAL DATA:  Pneumonia EXAM: CT CHEST WITH CONTRAST TECHNIQUE: Multidetector CT imaging of the chest was performed during intravenous contrast administration. RADIATION DOSE REDUCTION: This exam was performed according to the departmental dose-optimization program which includes automated exposure control, adjustment of the mA and/or kV according to patient size and/or use of iterative reconstruction technique. CONTRAST:  85m OMNIPAQUE IOHEXOL 350 MG/ML SOLN COMPARISON:  Previous CT done on 10/01/2020 and chest radiographs done today FINDINGS: Cardiovascular: There is homogeneous enhancement in thoracic aorta. There are no intraluminal filling defects in central pulmonary artery branches. Mediastinum/Nodes: There are enlarged lymph nodes in mediastinum and hilar regions. There is a precarinal node measuring 1.3 cm in short axis. There is subcarinal node measuring 1.4 cm in short axis. There is 2 cm node in the inferior right hilum. Evaluation of left hilum for lymphadenopathy is limited by dense adjacent infiltrates. Lungs/Pleura: There is large dense infiltrate in left upper lobe. There is 7 cm slightly inhomogeneous attenuation in the left apex. There is 5.5 cm density in the anteromedial left upper lobe. There is a 3.9 cm opacity in the left parahilar region and left lower lobe. There are other extensive patchy infiltrates in left upper lobe and left lower lobe. There is interval clearing of ground-glass densities in right lung. In image 116 of series 4, there is a new 1 cm nodule with spiculated margins in right lower lobe. In [place] there is 7 mm pleural-based density in posterior right lower lung field. In image 139, there is 6 mm nodular density in the posterior right lower  lobe. Small left pleural effusion is seen. There is no pneumothorax. Blebs and bullae are seen in the right apex. Upper Abdomen: There is fatty infiltration of the liver. Musculoskeletal: No focal lytic or sclerotic lesions are seen. IMPRESSION: There is interval appearance of large infiltrate in left upper lobe suggesting pneumonia. There are areas of homogeneous opacity without aeration in left upper lobe. Possibility of underlying malignant neoplastic processes not excluded. Extensive patchy infiltrates are seen in left lower lobe including 3.9 cm homogeneous opacity in the inferior left hilum. Small left pleural effusion is seen. There are enlarged lymph nodes in mediastinum and hilar regions suggesting infectious or neoplastic process. There are few nodular densities in right lower lobe largest measuring 10 mm in size with spiculated margins. This finding may suggest small foci of pneumonia or neoplastic process. Short-term follow-up CT after antibiotic treatment along with PET-CT if warranted should be considered. Electronically Signed: By: PElmer PickerM.D. On: 02/05/2022 14:29   MR SHOULDER RIGHT WO CONTRAST  Result Date: 02/05/2022 CLINICAL DATA:  Right shoulder pain. Destructive lesion involving the glenoid noted on recent CT scan. EXAM: MRI OF THE RIGHT  SHOULDER WITHOUT CONTRAST TECHNIQUE: Multiplanar, multisequence MR imaging of the shoulder was performed. No intravenous contrast was administered. COMPARISON:  Chest CT 02/05/2022 FINDINGS: Rotator cuff: The rotator cuff tendons are intact. Mild tendinopathy but no full-thickness retracted tear. Muscles:  No significant findings. Biceps long head:  Intact Acromioclavicular Joint: Mild degenerative changes. Type 2-3 acromion. Significant anterior downsloping of the acromion. No lateral downsloping or subacromial spurring. Glenohumeral Joint: There is a very aggressive destructive bone lesions involving the scapula, most notably in the glenoid  area but also involving the coracoid process and part of the scapular body. The bone is largely destroyed and there is a large associated soft tissue mass measuring approximately 5.0 x 4.5 cm. Osteomyelitis is a consideration but I would expect to see severe surrounding myofasciitis and cellulitis which really is not present. Also, I would expect there would be septic arthritis and involvement of the humeral head which there is not. Biopsy is recommended. Labrum:  Degenerated and likely torn. Bones: Destructive scapular bone lesion as detailed above. No other bony structures are in. Other: No subacromial/subdeltoid fluid collections to suggest bursitis. Enlarged right axillary lymph node is noted. IMPRESSION: 1. Destructive scapular bone lesion with associated large soft tissue mass measuring approximately 5.0 x 4.5 cm. Although osteomyelitis is a consideration I think it is unlikely and more likely a neoplastic process. 2. Biopsy is recommended. 3. Intact rotator cuff tendons and long head biceps tendon. Electronically Signed   By: Marijo Sanes M.D.   On: 02/05/2022 20:09   DG Shoulder Right Port  Addendum Date: 02/05/2022   ADDENDUM REPORT: 02/05/2022 16:27 ADDENDUM: The original report was by Dr. Van Clines. The following addendum is by Dr. Van Clines: These results were called by telephone at the time of interpretation on 02/05/2022 at 4:24 pm to provider [facility] , who verbally acknowledged these results. Electronically Signed   By: Van Clines M.D.   On: 02/05/2022 16:27   Result Date: 02/05/2022 CLINICAL DATA:  Coughing.  Right shoulder pain. EXAM: RIGHT SHOULDER - 2 VIEWS COMPARISON:  Chest radiograph 02/05/2022 FINDINGS: Permeative lytic process in the right glenoid. The proximal humerus and distal clavicle appear intact. IMPRESSION: 1. Permeative lytic lesion in the right glenoid favoring osteomyelitis given the concomitant extensive left pneumonia. Septic right glenohumeral  joint not excluded. Radiology assistant personnel have been notified to put me in telephone contact with the referring physician or the referring physician's clinical representative in order to discuss these findings. Once this communication is established I will issue an addendum to this report for documentation purposes. Electronically Signed: By: Van Clines M.D. On: 02/05/2022 16:21   DG Chest 2 View  Result Date: 02/05/2022 CLINICAL DATA:  Chronic for 1 month.  Right shoulder pain. EXAM: CHEST - 2 VIEW COMPARISON:  Chest two views 09/30/2020, CT chest 10/01/2020 FINDINGS: There is mild high-grade heterogeneous airspace opacification throughout the mid and superior aspects of the left lung with diffuse left lung interstitial thickening also extending into the inferior left lung. More homogeneous opacification of the left lung apex, possible pleural fluid versus airspace opacity. The right lung is clear. No inferior layering pleural effusion is seen. No pneumothorax. The cardiac silhouette is partially obscured but does not appear enlarged. Mediastinal contours are not well evaluated. No acute skeletal abnormality. IMPRESSION: Heterogeneous airspace opacification of the mid to upper left lung with diffuse left lung interstitial thickening. Findings are suspicious for high-grade pneumonia. Note is made on prior radiographs and CT there was clinical  concern for ground-glass, multifocal atypical pneumonia. Is this patient immunocompromised? Electronically Signed   By: Yvonne Kendall M.D.   On: 02/05/2022 09:50    ASSESSMENT: This is a very pleasant 61 years old African-American male recently diagnosed with a stage IVb (T3, N3, M1 C) non-small cell lung cancer, adenocarcinoma presented with large left upper lobe lung mass in addition to left hilar, precarinal and subcarinal as well as left supraclavicular lymphadenopathy and metastatic disease to the right lung and bone and the right scapular bone will  soft tissue mass diagnosed in September 2023.   PLAN: I had a lengthy discussion with the patient today about his current disease stage, prognosis and treatment options. I recommended for the patient to complete the staging work-up by ordering MRI of the brain as well as a PET scan. I also requested the tissue to be sent for molecular studies and PD-L1 expression in addition to blood to [facility] and the results are still pending. I discussed with the patient his treatment options and explained to him that he has incurable condition and all the treatment will be of palliative nature. I discussed with the patient the option of palliative care and hospice referral versus consideration of palliative treatment with either systemic chemotherapy in combination with immunotherapy with carboplatin, Alimta and Libtayo (Cempilimab) or targeted therapy if he has an actionable mutations. The patient is interested in treatment but will wait for the molecular studies before starting the systemic chemotherapy. I referred the patient to radiation oncology for consideration of palliative radiotherapy to the destructive scapular bone lesion on the right side. For pain management I will give him refill of oxycodone for now until he sees the palliative care provider for management of his pain issues. The patient understand that with his HIV status, the treatment may be more riskier than others especially with the immune compromised status from HIV. I will see the patient back for follow-up visit in around 2 weeks for more detailed discussion of his treatment options based on the imaging studies and molecular results. The patient was advised to call immediately if he has any other concerning symptoms in the interval.  The patient voices understanding of current disease status and treatment options and is in agreement with the current care plan.  All questions were answered. The patient knows to call the clinic with  any problems, questions or concerns. We can certainly see the patient much sooner if necessary.  Thank you so much for allowing me to participate in the care of Steven Johnston. I will continue to follow up the patient with you and assist in his care.  The total time spent in the appointment was 90 minutes.  Disclaimer: This note was dictated with voice recognition software. Similar sounding words can inadvertently be transcribed and may not be corrected upon review.   Eilleen Kempf February 12, 2022, 12:02 PM

## 2022-02-13 ENCOUNTER — Telehealth: Payer: Self-pay | Admitting: Radiation Oncology

## 2022-02-13 NOTE — Telephone Encounter (Signed)
LVM to sched CON with Dr. Sondra Come

## 2022-02-14 ENCOUNTER — Telehealth: Payer: Self-pay | Admitting: Radiation Oncology

## 2022-02-14 NOTE — Telephone Encounter (Signed)
Left message with pt's mother for pt to c/b asap to shed CON with Dr. Sondra Come.

## 2022-02-17 ENCOUNTER — Telehealth: Payer: Self-pay

## 2022-02-17 ENCOUNTER — Other Ambulatory Visit: Payer: Self-pay | Admitting: Internal Medicine

## 2022-02-17 MED ORDER — OXYCODONE HCL 5 MG PO TABS: 5.0000 mg | ORAL_TABLET | Freq: Three times a day (TID) | ORAL | 0 refills | Status: DC | PRN

## 2022-02-17 NOTE — Telephone Encounter (Signed)
Pt states he was seen 02/12/22 and was to received a refill of his Oxycodone.  I reviewed pts chart and see on 02/07/22 Oxycodone 5mg  was last prescribed by Dr. Florene Glen upon pts discharge from the hospital.  Progress note dated 02/13/12 from Dr. Julien Nordmann indicates "For pain management I will give him refill of oxycodone for now until he sees the palliative care provider for management of his pain issues"  Pt has been advised that a message will be sent to Dr. Julien Nordmann requesting he refill his pain medication.

## 2022-02-18 ENCOUNTER — Telehealth: Payer: Self-pay | Admitting: Medical Oncology

## 2022-02-18 NOTE — Progress Notes (Signed)
Histology and Location of Primary Cancer: stage IVb (T3, N3, M1 C) non-small cell lung cancer  Location(s) of Symptomatic Metastases: destructive right scapular bone lesion  Past/Anticipated chemotherapy by medical oncology, if any: palliative treatment with either systemic chemotherapy in combination with immunotherapy with carboplatin, Alimta and Libtayo (Cempilimab) or targeted therapy if he has an actionable mutations.  Pain on a scale of 0-10 is:    Ambulatory status? Walker? Wheelchair?:   SAFETY ISSUES: Prior radiation?  Pacemaker/ICD?  Possible current pregnancy? no Is the patient on methotrexate?   Current Complaints / other details:

## 2022-02-18 NOTE — Telephone Encounter (Signed)
Kalai called again today for his appt date and time. I left the  information on his mother's VM and to return my call if she had any questions. XRT appts tomorrow.

## 2022-02-19 ENCOUNTER — Ambulatory Visit
Admission: RE | Admit: 2022-02-19 | Discharge: 2022-02-19 | Disposition: A | Discharge status: Home / Self Care | Payer: Commercial Managed Care - HMO | Source: Ambulatory Visit | Attending: Radiation Oncology | Admitting: Radiation Oncology

## 2022-02-19 ENCOUNTER — Ambulatory Visit: Payer: Commercial Managed Care - HMO

## 2022-02-20 ENCOUNTER — Ambulatory Visit (HOSPITAL_COMMUNITY)
Admission: RE | Admit: 2022-02-20 | Discharge: 2022-02-20 | Disposition: A | Discharge status: Home / Self Care | Payer: Commercial Managed Care - HMO | Source: Ambulatory Visit | Attending: Infectious Diseases | Admitting: Infectious Diseases

## 2022-02-20 DIAGNOSIS — Z129 Encounter for screening for malignant neoplasm, site unspecified: Secondary | ICD-10-CM | POA: Insufficient documentation

## 2022-02-21 ENCOUNTER — Encounter (HOSPITAL_COMMUNITY): Payer: Self-pay

## 2022-02-21 NOTE — Progress Notes (Signed)
Histology and Location of Primary Cancer: stage IVb (T3, N3, M1 C) non-small cell lung cancer  Location(s) of Symptomatic Metastases: destructive right scapular bone lesion  Past/Anticipated chemotherapy by medical oncology, if any: palliative treatment with either systemic chemotherapy in combination with immunotherapy with carboplatin, Alimta and Libtayo (Cempilimab) or targeted therapy if he has an actionable mutations.  Pain on a scale of 0-10 is: {Number; 2-10  not applicable:20727}   Ambulatory status? Walker? Wheelchair?: {VQI Ambulatory Status:20974}  SAFETY ISSUES: Prior radiation? {:18581} Pacemaker/ICD? {:18581} Possible current pregnancy? no Is the patient on methotrexate? {:18581}  Current Complaints / other details:  ***

## 2022-02-23 NOTE — Progress Notes (Signed)
Radiation Oncology         [phone] ________________________________  Initial Outpatient Consultation  Name: Steven Johnston MRN: [id number]  Date: 02/24/2022  DOB: [birth year]  BU:LAGTXMI, No Pcp Per  Curt Bears, MD   REFERRING PHYSICIAN: Curt Bears, MD  DIAGNOSIS: The primary encounter diagnosis was Non-small cell lung cancer, unspecified laterality ([place]). A diagnosis of Metastasis to bone [facility]) was also pertinent to this visit.  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   Cancer Staging  Non-small cell lung cancer (HCC) Staging form: Lung, AJCC 8th Edition - Clinical: Stage IVB (cT3, cN3, cM1c) - Signed by Curt Bears, MD on 02/12/2022  HISTORY OF PRESENT ILLNESS::Steven Johnston is a 61 y.o. male who is seen urgently as a courtesy of Dr. Julien Nordmann for an opinion concerning palliative radiation therapy as part of management for his recently diagnosed metastatic lung cancer (destructive right scapular bone lesion) . The patient has a medical history significant for HIV which is well controlled with Biktarvy. He is followed by ID. Patient also has a history of smoking for around 40 years but recently cut down to a few cigarettes a day.   The patient recently presented to the ED on 02/05/22 for evaluation of cough and right shoulder pain over the last month and a half.  Patient detailed cough as productive of yellowish to greenish sputum.  He's also reported a 25 lbs of weight loss and night sweats. CXR performed in the ED showed findings concerning for high-grade pneumonia in the left lung and he was accordingly admitted.   MRI  of the right shoulder without contrast performed on 09/06 showed a destructive scapular bone lesion and an associated large soft tissue mass measuring approximately 5.0 x 4.5 cm concerning for neoplastic process.   Chest CT also performed on 09/06 redemonstrated a large infiltrate in left upper lobe suggestive of pneumonia. CT also showed: areas of homogeneous opacity without aeration in left upper lobe possibly concerning for malignant neoplastic processes; extensive patchy infiltrates in the left lower lobe including a 3.9 cm homogeneous opacity in the inferior left hilum; a small left pleural effusion; enlarged lymph nodes in mediastinum and hilar regions concerning for infectious or neoplastic processes; and a few nodular densities in right lower lobe, largest measuring 10 mm with spiculated margins and possibly concerning for a small foci of pneumonia or neoplastic process.   -- Addendum report noted presence of a 1.5 cm pleural-based noncalcified nodule in the medial left upper lobe on chest CT performed on 10/01/2020. Given presence on prior imaging from last year, imaging findings on 02/05/22 CT most likely suggested extensive malignant neoplastic process with lymphangitic spread of metastatic disease throughout the left lung. Following further review, addendum report also noted presence of a 3 cm left supraclavicular lymph node suggestive if metastatic disease, as well as a lytic lesion in right glenoid consistent with skeletal metastatic disease.  The patient underwent biopsy of the dominant left supraclavicular lymph node while admitted on 02/06/22. Pathology revealed poorly differentiated non-small cell carcinoma.   Patient was discharged the day after his biopsy with referrals arranged for OP oncology evaluation/care.  Accordingly, the patient met with Dr. Julien Nordmann on 02/12/22 for evaluation  and to discuss treatment options. During this visit, the patient was noted to endorse dry cough,  shortness of breath (worse with exertion), cold sweats, and a 20 pound weight loss over the course of several months. He otherwise denied any significant chest pain or hemoptysis. For further work-up and staging, Dr. Julien Nordmann recommended proceeding with an MRI of the brain, PET scan, molecular studies, PD-L1 expression, and guardant 360. These are all pending at this time. In terms of treatment options, Dr. Julien Nordmann discussed the option of palliative treatment with either systemic chemotherapy in combination with immunotherapy with carboplatin, Alimta and Libtayo (Cempilimab), or targeted therapy if he's found to have actionable mutations. The patient would like to wait for results from molecular studies prior to making a decision regarding systemic treatment.  PREVIOUS RADIATION THERAPY: No  PAST MEDICAL HISTORY:  Past Medical History:  Diagnosis Date   Hernia, inguinal, right    HIV (human immunodeficiency virus infection) ([place])     PAST SURGICAL HISTORY: Past Surgical History:  Procedure Laterality Date   BRONCHIAL WASHINGS  10/05/2020   Procedure: BRONCHIAL WASHINGS;  Surgeon: Julian Hy, DO;  Location: [facility];  Service: Endoscopy;;   INGUINAL HERNIA REPAIR Right 02/17/2018   Procedure: OPEN HERNIA REPAIR RIGHT INGUINAL INCARCERATED WITH DIAGONSTIC LAPAROSCOPY FOR INCARCERATED HERNIA;  Surgeon: Ileana Roup, MD;  Location: [place];  Service: General;  Laterality: Right;   VIDEO BRONCHOSCOPY N/A 10/05/2020   Procedure: VIDEO BRONCHOSCOPY WITHOUT FLUORO;  Surgeon: Julian Hy, DO;  Location: [place];  Service: Endoscopy;  Laterality: N/A;    FAMILY HISTORY:  Family History  Problem Relation Age of Onset   Pancreatic cancer Brother    Immunodeficiency Neg Hx     SOCIAL HISTORY:  Social History   Tobacco Use   Smoking status: Every Day    Packs/day: 0.30    Types: Cigarettes   Smokeless tobacco: Never  Substance Use Topics   Alcohol use: Yes    Comment: for  celebrations   Drug use: No    ALLERGIES: No Known Allergies  MEDICATIONS:  Current Outpatient Medications  Medication Sig Dispense Refill   albuterol (VENTOLIN HFA) 108 (90 Base) MCG/ACT inhaler Inhale 2 puffs into the lungs every 6 (six) hours as needed for wheezing or shortness of breath. 6.7 g 2   bictegravir-emtricitabine-tenofovir AF (BIKTARVY) 50-200-25 MG TABS tablet Take 1 tablet by mouth daily. 30 tablet 5   diclofenac Sodium (VOLTAREN) 1 % GEL Apply 2 g topically 4 (four) times daily. 100 g 0   nicotine (NICODERM CQ - DOSED IN MG/24 HOURS) 14 mg/24hr patch Place 1 patch (14 mg total) onto the skin daily. 28 patch 0   oxyCODONE (ROXICODONE) 5 MG immediate release tablet Take 1 tablet (5 mg total) by mouth every 8 (eight) hours as needed. 20 tablet 0   No current facility-administered medications for this encounter.    REVIEW OF SYSTEMS:  A 10+ POINT REVIEW OF SYSTEMS WAS OBTAINED including neurology, dermatology, psychiatry, cardiac, respiratory, lymph, extremities, GI, GU, musculoskeletal, constitutional, reproductive, HEENT.  He complains of significant pain in his right shoulder area.  He has very limited movement of his right arm due to this pain.  He denies any hemoptysis problems with headaches.   PHYSICAL EXAM:  height is 5' 10"  (1.778 m) and weight is 124 lb 12.8 oz (56.6 kg). His temperature is 97.7 F (36.5 C). His blood pressure is 112/83 and his pulse is 97. His respiration is 24 (abnormal) and  oxygen saturation is 92%.   General: Alert and oriented, in no acute distress HEENT: Head is normocephalic. Extraocular movements are intact.  Neck: Neck is supple, no palpable cervical or supraclavicular lymphadenopathy. Heart: Regular in rate and rhythm with no murmurs, rubs, or gallops. Chest: Clear to auscultation bilaterally, with no rhonchi, wheezes, or rales. Abdomen: Soft, nontender, nondistended, with no rigidity or guarding. Extremities: No cyanosis or  edema. Lymphatics: see Neck Exam Skin: No concerning lesions. Musculoskeletal: He has very little movement of his right arm due to pain. Neurologic: Cranial nerves II through XII are grossly intact. No obvious focalities. Speech is fluent. Coordination is intact. Psychiatric: Judgment and insight are intact. Affect is appropriate. No obvious palpable mass along the right shoulder region.  ECOG = 1  0 - Asymptomatic (Fully active, able to carry on all predisease activities without restriction)  1 - Symptomatic but completely ambulatory (Restricted in physically strenuous activity but ambulatory and able to carry out work of a light or sedentary nature. For example, light housework, office work)  2 - Symptomatic, <50% in bed during the day (Ambulatory and capable of all self care but unable to carry out any work activities. Up and about more than 50% of waking hours)  3 - Symptomatic, >50% in bed, but not bedbound (Capable of only limited self-care, confined to bed or chair 50% or more of waking hours)  4 - Bedbound (Completely disabled. Cannot carry on any self-care. Totally confined to bed or chair)  5 - Death   Eustace Pen MM, Creech RH, Tormey DC, et al. [phone]). "Toxicity and response criteria of the [facility]". [place] Oncol. 5 (6): 649-55  LABORATORY DATA:  Lab Results  Component Value Date   WBC 9.9 02/12/2022   HGB 12.3 (L) 02/12/2022   HCT 38.4 (L) 02/12/2022   MCV 81.2 02/12/2022   PLT 463 (H) 02/12/2022   NEUTROABS 7.0 02/12/2022   Lab Results  Component Value Date   NA 135 02/12/2022   K 4.1 02/12/2022   CL 100 02/12/2022   CO2 31 02/12/2022   GLUCOSE 105 (H) 02/12/2022   BUN 12 02/12/2022   CREATININE 0.84 02/12/2022   CALCIUM 9.0 02/12/2022      RADIOGRAPHY: US Abdomen Complete  Result Date: 02/22/2022 CLINICAL DATA:  Hepatitis-B. EXAM: ABDOMEN ULTRASOUND COMPLETE COMPARISON:  10/03/2021 ultrasound abdomen FINDINGS: Gallbladder: No  gallstones or wall thickening visualized. No sonographic Murphy sign noted by sonographer. Common bile duct: Diameter: 2 mm Liver: No focal lesion identified. Within normal limits in parenchymal echogenicity. Portal vein is patent on color Doppler imaging with normal direction of blood flow towards the liver. IVC: No abnormality visualized. Pancreas: Not visualized due to overlying bowel gas. Spleen: Size and appearance within normal limits. Right Kidney: Length: 10.2 cm. Echogenicity within normal limits. No mass or hydronephrosis visualized. Left Kidney: Length: 10.6 cm. Echogenicity within normal limits. No mass or hydronephrosis visualized. Abdominal aorta: No aneurysm visualized. Other findings: Left pleural effusion. IMPRESSION: 1. No cholelithiasis or sonographic evidence for acute cholecystitis. 2. Left pleural effusion. Electronically Signed   By: Lovey Newcomer M.D.   On: 02/22/2022 08:14   DG CHEST PORT 1 VIEW  Result Date: 02/06/2022 CLINICAL DATA:  61 year old male presenting for evaluation of cough and shoulder pain. EXAM: PORTABLE CHEST 1 VIEW COMPARISON:  February 05, 2022. FINDINGS: Interstitial thickening in the chest similar to previous imaging. Opacity in the LEFT upper chest similar to previous imaging in this patient with known  LEFT upper lobe pulmonary mass. Diminished volume in the LEFT chest likely related underlying neoplasm and interstitial changes. Diffuse airspace process throughout aerated portions of LEFT lung is also similar. RIGHT chest is largely clear with the exception of potential new subtle opacities at the RIGHT lung base since previous imaging. Small amount of gas in the LEFT supraclavicular fossa likely related to recent biopsy. On limited assessment no acute skeletal findings. IMPRESSION: 1. 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 Correlate with  respiratory symptoms. 2. Subtle basilar opacities on the RIGHT better seen on chest CT compatible with known pulmonary nodules in this location. No new areas of lobar consolidation. 3. Postprocedural changes of biopsy in the LEFT supraclavicular region. Electronically Signed   By: Zetta Bills M.D.   On: 02/06/2022 11:54   [place] CORE BIOPSY (LYMPH NODES)  Result Date: 02/06/2022 INDICATION: Concern for metastatic lung cancer please perform ultrasound-guided biopsy of left supraclavicular nodal mass for tissue diagnostic purposes. EXAM: ULTRASOUND-GUIDED LEFT SUPRACLAVICULAR LYMPH NODE BIOPSY COMPARISON:  Chest CT-02/05/2022 MEDICATIONS: None ANESTHESIA/SEDATION: None COMPLICATIONS: None immediate. TECHNIQUE: Informed written consent was obtained from the patient after a discussion of the risks, benefits and alternatives to treatment. Questions regarding the procedure were encouraged and answered. Initial ultrasound scanning demonstrated bulky left supraclavicular nodal conglomeration with dominant component measuring approximately 3.7 x 3.0 cm, similar to preceding chest CT image 22, series 3. An ultrasound image was saved for documentation purposes. The procedure was planned. A timeout was performed prior to the initiation of the procedure. The operative was prepped and draped in the usual sterile fashion, and a sterile drape was applied covering the operative field. A timeout was performed prior to the initiation of the procedure. Local anesthesia was provided with 1% lidocaine with epinephrine. Under direct ultrasound guidance, an 18 gauge core needle device was utilized to obtain to obtain 6 core needle biopsies of the left supraclavicular nodal conglomeration. The samples were placed in saline and submitted to pathology. The needle was removed and hemostasis was achieved with manual compression. Post procedure scan was negative for significant hematoma. A dressing was applied. The patient tolerated the  procedure well without immediate postprocedural complication. IMPRESSION: Technically successful ultrasound guided biopsy of dominant left supraclavicular nodal conglomeration lymph node. Electronically Signed   By: Sandi Mariscal M.D.   On: 02/06/2022 10:37   CT CHEST W CONTRAST  Addendum Date: 02/06/2022   ADDENDUM REPORT: 02/06/2022 10:23 ADDENDUM: Images were reviewed again. There is 3 cm left supraclavicular lymph node suggesting metastatic disease. There is a lytic lesion in right glenoid consistent with skeletal metastatic disease. Electronically Signed   By: Elmer Picker M.D.   On: 02/06/2022 10:23   Addendum Date: 02/05/2022   ADDENDUM REPORT: 02/05/2022 16:15 ADDENDUM: Images were reviewed with additional clinical history. In the previous CT chest done on 10/01/2020, there is 1.5 cm pleural-based noncalcified nodule in the medial left upper lobe. In view of this prior finding, imaging findings in the left lung in the current study most likely suggest extensive malignant neoplastic process with lymphangitic spread of metastatic disease throughout left lung. Nodular densities in the right lung suggest metastatic disease. Enlarged lymph nodes in mediastinum suggest metastatic lymphadenopathy. Follow-up PET-CT and tissue sampling should be considered. Electronically Signed   By: Elmer Picker M.D.   On: 02/05/2022 16:15   Result Date: 02/06/2022 CLINICAL DATA:  Pneumonia EXAM: CT CHEST WITH  CONTRAST TECHNIQUE: Multidetector CT imaging of the chest was performed during intravenous contrast administration. RADIATION DOSE REDUCTION: This exam was performed according to the departmental dose-optimization program which includes automated exposure control, adjustment of the mA and/or kV according to patient size and/or use of iterative reconstruction technique. CONTRAST:  1m OMNIPAQUE IOHEXOL 350 MG/ML SOLN COMPARISON:  Previous CT done on 10/01/2020 and chest radiographs done today FINDINGS:  Cardiovascular: There is homogeneous enhancement in thoracic aorta. There are no intraluminal filling defects in central pulmonary artery branches. Mediastinum/Nodes: There are enlarged lymph nodes in mediastinum and hilar regions. There is a precarinal node measuring 1.3 cm in short axis. There is subcarinal node measuring 1.4 cm in short axis. There is 2 cm node in the inferior right hilum. Evaluation of left hilum for lymphadenopathy is limited by dense adjacent infiltrates. Lungs/Pleura: There is large dense infiltrate in left upper lobe. There is 7 cm slightly inhomogeneous attenuation in the left apex. There is 5.5 cm density in the anteromedial left upper lobe. There is a 3.9 cm opacity in the left parahilar region and left lower lobe. There are other extensive patchy infiltrates in left upper lobe and left lower lobe. There is interval clearing of ground-glass densities in right lung. In image 116 of series 4, there is a new 1 cm nodule with spiculated margins in right lower lobe. In MK[place]one hundred f58 there is 7 mm pleural-based density in posterior right lower lung field. In image 139, there is 6 mm nodular density in the posterior right lower lobe. Small left pleural effusion is seen. There is no pneumothorax. Blebs and bullae are seen in the right apex. Upper Abdomen: There is fatty infiltration of the liver. Musculoskeletal: No focal lytic or sclerotic lesions are seen. IMPRESSION: There is interval appearance of large infiltrate in left upper lobe suggesting pneumonia. There are areas of homogeneous opacity without aeration in left upper lobe. Possibility of underlying malignant neoplastic processes not excluded. Extensive patchy infiltrates are seen in left lower lobe including 3.9 cm homogeneous opacity in the inferior left hilum. Small left pleural effusion is seen. There are enlarged lymph nodes in mediastinum and hilar regions suggesting infectious or neoplastic process. There are few  nodular densities in right lower lobe largest measuring 10 mm in size with spiculated margins. This finding may suggest small foci of pneumonia or neoplastic process. Short-term follow-up CT after antibiotic treatment along with PET-CT if warranted should be considered. Electronically Signed: By: PElmer PickerM.D. On: 02/05/2022 14:29   MR SHOULDER RIGHT WO CONTRAST  Result Date: 02/05/2022 CLINICAL DATA:  Right shoulder pain. Destructive lesion involving the glenoid noted on recent CT scan. EXAM: MRI OF THE RIGHT SHOULDER WITHOUT CONTRAST TECHNIQUE: Multiplanar, multisequence MR imaging of the shoulder was performed. No intravenous contrast was administered. COMPARISON:  Chest CT 02/05/2022 FINDINGS: Rotator cuff: The rotator cuff tendons are intact. Mild tendinopathy but no full-thickness retracted tear. Muscles:  No significant findings. Biceps long head:  Intact Acromioclavicular Joint: Mild degenerative changes. Type 2-3 acromion. Significant anterior downsloping of the acromion. No lateral downsloping or subacromial spurring. Glenohumeral Joint: There is a very aggressive destructive bone lesions involving the scapula, most notably in the glenoid area but also involving the coracoid process and part of the scapular body. The bone is largely destroyed and there is a large associated soft tissue mass measuring approximately 5.0 x 4.5 cm. Osteomyelitis is a consideration but I would expect to see severe surrounding myofasciitis and cellulitis which really  is not present. Also, I would expect there would be septic arthritis and involvement of the humeral head which there is not. Biopsy is recommended. Labrum:  Degenerated and likely torn. Bones: Destructive scapular bone lesion as detailed above. No other bony structures are in. Other: No subacromial/subdeltoid fluid collections to suggest bursitis. Enlarged right axillary lymph node is noted. IMPRESSION: 1. Destructive scapular bone lesion with  associated large soft tissue mass measuring approximately 5.0 x 4.5 cm. Although osteomyelitis is a consideration I think it is unlikely and more likely a neoplastic process. 2. Biopsy is recommended. 3. Intact rotator cuff tendons and long head biceps tendon. Electronically Signed   By: Marijo Sanes M.D.   On: 02/05/2022 20:09   DG Shoulder Right Port  Addendum Date: 02/05/2022   ADDENDUM REPORT: 02/05/2022 16:27 ADDENDUM: The original report was by Dr. Van Clines. The following addendum is by Dr. Van Clines: These results were called by telephone at the time of interpretation on 02/05/2022 at 4:24 pm to provider [facility] , who verbally acknowledged these results. Electronically Signed   By: Van Clines M.D.   On: 02/05/2022 16:27   Result Date: 02/05/2022 CLINICAL DATA:  Coughing.  Right shoulder pain. EXAM: RIGHT SHOULDER - 2 VIEWS COMPARISON:  Chest radiograph 02/05/2022 FINDINGS: Permeative lytic process in the right glenoid. The proximal humerus and distal clavicle appear intact. IMPRESSION: 1. Permeative lytic lesion in the right glenoid favoring osteomyelitis given the concomitant extensive left pneumonia. Septic right glenohumeral joint not excluded. Radiology assistant personnel have been notified to put me in telephone contact with the referring physician or the referring physician's clinical representative in order to discuss these findings. Once this communication is established I will issue an addendum to this report for documentation purposes. Electronically Signed: By: Van Clines M.D. On: 02/05/2022 16:21   DG Chest 2 View  Result Date: 02/05/2022 CLINICAL DATA:  Chronic for 1 month.  Right shoulder pain. EXAM: CHEST - 2 VIEW COMPARISON:  Chest two views 09/30/2020, CT chest 10/01/2020 FINDINGS: There is mild high-grade heterogeneous airspace opacification throughout the mid and superior aspects of the left lung with diffuse left lung interstitial thickening  also extending into the inferior left lung. More homogeneous opacification of the left lung apex, possible pleural fluid versus airspace opacity. The right lung is clear. No inferior layering pleural effusion is seen. No pneumothorax. The cardiac silhouette is partially obscured but does not appear enlarged. Mediastinal contours are not well evaluated. No acute skeletal abnormality. IMPRESSION: Heterogeneous airspace opacification of the mid to upper left lung with diffuse left lung interstitial thickening. Findings are suspicious for high-grade pneumonia. Note is made on prior radiographs and CT there was clinical concern for ground-glass, multifocal atypical pneumonia. Is this patient immunocompromised? Electronically Signed   By: Yvonne Kendall M.D.   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  He would be a good candidate for short course of palliative radiation therapy directed this area.  Today, I talked to the patient about the findings and work-up thus far.  We discussed the natural history of non-small cell lung cancer and general treatment, highlighting the role of radiotherapy in the management.  We discussed the available radiation techniques, and focused on the details of logistics and delivery.  We  reviewed the anticipated acute and late sequelae associated with radiation in this setting.  The patient was encouraged to ask questions that I answered to the best of my ability.  A patient consent form was discussed and signed.  We retained a copy for our records.  The patient would like to proceed with radiation and will be scheduled for CT simulation.  PLAN: We will return for CT simulation tomorrow.  Treatments to begin on September 28.  Anticipate 10 treatments  directed at the mass along the right scapula and associated bone involvement   60 minutes of total time was spent for this patient encounter, including preparation, face-to-face counseling with the patient and coordination of care, physical exam, and documentation of the encounter.   ------------------------------------------------  Blair Promise, PhD, MD  This document serves as a record of services personally performed by Gery Pray, MD. It was created on his behalf by Roney Mans, a trained medical scribe. The creation of this record is based on the scribe's personal observations and the provider's statements to them. This document has been checked and approved by the attending provider.

## 2022-02-24 ENCOUNTER — Ambulatory Visit
Admission: RE | Admit: 2022-02-24 | Discharge: 2022-02-24 | Disposition: A | Discharge status: Home / Self Care | Payer: Commercial Managed Care - HMO | Source: Ambulatory Visit | Attending: Radiation Oncology | Admitting: Radiation Oncology

## 2022-02-24 ENCOUNTER — Encounter: Payer: Self-pay | Admitting: Radiation Oncology

## 2022-02-24 ENCOUNTER — Other Ambulatory Visit: Payer: Self-pay

## 2022-02-24 ENCOUNTER — Telehealth: Payer: Self-pay | Admitting: Internal Medicine

## 2022-02-24 VITALS — BP 112/83 | HR 97 | Temp 97.7°F | Resp 24 | Ht 70.0 in | Wt 124.8 lb

## 2022-02-24 DIAGNOSIS — C7951 Secondary malignant neoplasm of bone: Secondary | ICD-10-CM

## 2022-02-24 DIAGNOSIS — Z21 Asymptomatic human immunodeficiency virus [HIV] infection status: Secondary | ICD-10-CM | POA: Diagnosis not present

## 2022-02-24 DIAGNOSIS — C3490 Malignant neoplasm of unspecified part of unspecified bronchus or lung: Secondary | ICD-10-CM

## 2022-02-24 DIAGNOSIS — Z791 Long term (current) use of non-steroidal anti-inflammatories (NSAID): Secondary | ICD-10-CM | POA: Insufficient documentation

## 2022-02-24 DIAGNOSIS — J90 Pleural effusion, not elsewhere classified: Secondary | ICD-10-CM | POA: Diagnosis not present

## 2022-02-24 DIAGNOSIS — R590 Localized enlarged lymph nodes: Secondary | ICD-10-CM | POA: Diagnosis not present

## 2022-02-24 DIAGNOSIS — F17210 Nicotine dependence, cigarettes, uncomplicated: Secondary | ICD-10-CM | POA: Insufficient documentation

## 2022-02-24 DIAGNOSIS — Z79624 Long term (current) use of inhibitors of nucleotide synthesis: Secondary | ICD-10-CM | POA: Diagnosis not present

## 2022-02-24 DIAGNOSIS — K449 Diaphragmatic hernia without obstruction or gangrene: Secondary | ICD-10-CM | POA: Diagnosis not present

## 2022-02-24 DIAGNOSIS — Z800 Family history of malignant neoplasm of digestive organs: Secondary | ICD-10-CM | POA: Diagnosis not present

## 2022-02-24 DIAGNOSIS — C3412 Malignant neoplasm of upper lobe, left bronchus or lung: Secondary | ICD-10-CM | POA: Insufficient documentation

## 2022-02-24 DIAGNOSIS — K760 Fatty (change of) liver, not elsewhere classified: Secondary | ICD-10-CM | POA: Diagnosis not present

## 2022-02-24 DIAGNOSIS — R634 Abnormal weight loss: Secondary | ICD-10-CM | POA: Insufficient documentation

## 2022-02-24 NOTE — Telephone Encounter (Signed)
Called patient regarding upcoming 09/26 and 09/28 appointment, left a voicemail.

## 2022-02-25 ENCOUNTER — Other Ambulatory Visit: Payer: Self-pay

## 2022-02-25 ENCOUNTER — Telehealth: Payer: Self-pay

## 2022-02-25 ENCOUNTER — Encounter: Payer: Self-pay | Admitting: Nurse Practitioner

## 2022-02-25 ENCOUNTER — Inpatient Hospital Stay (HOSPITAL_BASED_OUTPATIENT_CLINIC_OR_DEPARTMENT_OTHER): Payer: Commercial Managed Care - HMO | Admitting: Nurse Practitioner

## 2022-02-25 ENCOUNTER — Ambulatory Visit
Admission: RE | Admit: 2022-02-25 | Discharge: 2022-02-25 | Disposition: A | Discharge status: Home / Self Care | Payer: Commercial Managed Care - HMO | Source: Ambulatory Visit | Attending: Radiation Oncology | Admitting: Radiation Oncology

## 2022-02-25 VITALS — BP 110/73 | HR 87 | Temp 97.6°F | Resp 19 | Ht 70.0 in | Wt 125.4 lb

## 2022-02-25 DIAGNOSIS — C7951 Secondary malignant neoplasm of bone: Secondary | ICD-10-CM

## 2022-02-25 DIAGNOSIS — C3431 Malignant neoplasm of lower lobe, right bronchus or lung: Secondary | ICD-10-CM | POA: Insufficient documentation

## 2022-02-25 DIAGNOSIS — C3490 Malignant neoplasm of unspecified part of unspecified bronchus or lung: Secondary | ICD-10-CM

## 2022-02-25 DIAGNOSIS — Z800 Family history of malignant neoplasm of digestive organs: Secondary | ICD-10-CM | POA: Insufficient documentation

## 2022-02-25 DIAGNOSIS — Z809 Family history of malignant neoplasm, unspecified: Secondary | ICD-10-CM | POA: Insufficient documentation

## 2022-02-25 DIAGNOSIS — G893 Neoplasm related pain (acute) (chronic): Secondary | ICD-10-CM

## 2022-02-25 DIAGNOSIS — M25511 Pain in right shoulder: Secondary | ICD-10-CM | POA: Insufficient documentation

## 2022-02-25 DIAGNOSIS — C3412 Malignant neoplasm of upper lobe, left bronchus or lung: Secondary | ICD-10-CM | POA: Insufficient documentation

## 2022-02-25 DIAGNOSIS — Z510 Encounter for antineoplastic radiation therapy: Secondary | ICD-10-CM | POA: Insufficient documentation

## 2022-02-25 DIAGNOSIS — K760 Fatty (change of) liver, not elsewhere classified: Secondary | ICD-10-CM | POA: Insufficient documentation

## 2022-02-25 DIAGNOSIS — Z791 Long term (current) use of non-steroidal anti-inflammatories (NSAID): Secondary | ICD-10-CM | POA: Insufficient documentation

## 2022-02-25 DIAGNOSIS — Z515 Encounter for palliative care: Secondary | ICD-10-CM

## 2022-02-25 DIAGNOSIS — Z21 Asymptomatic human immunodeficiency virus [HIV] infection status: Secondary | ICD-10-CM | POA: Insufficient documentation

## 2022-02-25 DIAGNOSIS — R059 Cough, unspecified: Secondary | ICD-10-CM | POA: Insufficient documentation

## 2022-02-25 DIAGNOSIS — F17210 Nicotine dependence, cigarettes, uncomplicated: Secondary | ICD-10-CM | POA: Insufficient documentation

## 2022-02-25 DIAGNOSIS — R530 Neoplastic (malignant) related fatigue: Secondary | ICD-10-CM

## 2022-02-25 MED ORDER — BENZONATATE 100 MG PO CAPS: 200.0000 mg | ORAL_CAPSULE | Freq: Three times a day (TID) | ORAL | 0 refills | Status: AC | PRN

## 2022-02-25 MED ORDER — OXYCODONE HCL 5 MG PO TABS: 5.0000 mg | ORAL_TABLET | Freq: Three times a day (TID) | ORAL | 0 refills | Status: DC | PRN

## 2022-02-25 MED ORDER — ALBUTEROL SULFATE HFA 108 (90 BASE) MCG/ACT IN AERS: 2.0000 | INHALATION_SPRAY | Freq: Four times a day (QID) | RESPIRATORY_TRACT | 1 refills | Status: AC | PRN

## 2022-02-25 NOTE — Telephone Encounter (Signed)
I have also left a message for pts personal RN advocate through his insurance advising pt needs a PCP asap. I have asked that she also assist the pt with obtaining one.

## 2022-02-25 NOTE — Progress Notes (Signed)
[facility]  [phone] [phone]   Name: Veldon Wager Date: 02/25/2022 MRN: [id number]  DOB: January 06, 1961  Patient Care Team: Patient, No Pcp Per as PCP - General ([place]) Berenice Primas, RN - [employer] Personal Nurse Advocate as Case Manager    REASON FOR CONSULTATION: Amaru Burroughs is a 61 y.o. 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 Current plans to undergo palliative radiation and chemotherapy.  Palliative ask to see for symptom management and goals of care.    SOCIAL HISTORY:     reports that he has been smoking cigarettes. He has been smoking an average of .3 packs per day. He has never used smokeless tobacco. He reports current alcohol use. He reports that he does not use drugs.  ADVANCE DIRECTIVES:    CODE STATUS:   PAST MEDICAL HISTORY: Past Medical History:  Diagnosis Date   Hernia, inguinal, right    HIV (human immunodeficiency virus infection) ([place])     PAST SURGICAL HISTORY:  Past Surgical History:  Procedure Laterality Date   BRONCHIAL WASHINGS  10/05/2020   Procedure: BRONCHIAL WASHINGS;  Surgeon: Julian Hy, DO;  Location: [facility];  Service: Endoscopy;;   INGUINAL HERNIA REPAIR Right 02/17/2018   Procedure: OPEN HERNIA REPAIR RIGHT INGUINAL INCARCERATED WITH DIAGONSTIC LAPAROSCOPY FOR INCARCERATED HERNIA;  Surgeon: Ileana Roup, MD;  Location: [facility];  Service: General;  Laterality: Right;   VIDEO BRONCHOSCOPY N/A 10/05/2020   Procedure: VIDEO BRONCHOSCOPY WITHOUT FLUORO;  Surgeon: Julian Hy, DO;  Location: [facility];  Service: Endoscopy;  Laterality: N/A;    HEMATOLOGY/ONCOLOGY HISTORY:  Oncology History  Non-small cell lung cancer ([place])  02/12/2022 Initial Diagnosis   Non-small cell  lung cancer ([place])   02/12/2022 Cancer Staging   Staging form: Lung, AJCC 8th Edition - Clinical: Stage IVB (cT3, cN3, cM1c) - Signed by Curt Bears, MD on 02/12/2022     ALLERGIES:  has No Known Allergies.  MEDICATIONS:  Current Outpatient Medications  Medication Sig Dispense Refill   benzonatate (TESSALON) 100 MG capsule Take 2 capsules (200 mg total) by mouth 3 (three) times daily as needed for cough. 30 capsule 0   albuterol (VENTOLIN HFA) 108 (90 Base) MCG/ACT inhaler Inhale 2 puffs into the lungs every 6 (six) hours as needed for wheezing or shortness of breath. 6.7 g 1   bictegravir-emtricitabine-tenofovir AF (BIKTARVY) 50-200-25 MG TABS tablet Take 1 tablet by mouth daily. 30 tablet 5   diclofenac Sodium (VOLTAREN) 1 % GEL Apply 2 g topically 4 (four) times daily. 100 g 0   nicotine (NICODERM CQ - DOSED IN MG/24 HOURS) 14 mg/24hr patch Place 1 patch (14 mg total) onto the skin daily. 28 patch 0   oxyCODONE (ROXICODONE) 5 MG immediate release tablet Take 1 tablet (5 mg total) by mouth every 8 (eight) hours as needed. 45 tablet 0   No current facility-administered medications for this visit.    VITAL SIGNS: BP 110/73 (BP Location: Left Arm, Patient Position: Sitting)   Pulse 87   Temp 97.6 F (36.4 C) (Oral)   Resp 19   Ht 5\' 10"  (1.778 m)   Wt 125 lb 6.4 oz (56.9 kg)   SpO2 91%   BMI 17.99 kg/m  Filed Weights   02/25/22 1310  Weight: 125 lb 6.4 oz (56.9 kg)  Estimated body mass index is 17.99 kg/m as calculated from the following:   Height as of this encounter: 5\' 10"  (1.778 m).   Weight as of this encounter: 125 lb 6.4 oz (56.9 kg).  LABS: CBC:    Component Value Date/Time   WBC 9.9 02/12/2022 1130   WBC 9.1 02/07/2022 0342   HGB 12.3 (L) 02/12/2022 1130   HCT 38.4 (L) 02/12/2022 1130   PLT 463 (H) 02/12/2022 1130   MCV 81.2 02/12/2022 1130   NEUTROABS 7.0 02/12/2022 1130   LYMPHSABS 1.2 02/12/2022 1130   MONOABS 1.0 02/12/2022 1130   EOSABS 0.6 (H)  02/12/2022 1130   BASOSABS 0.0 02/12/2022 1130   Comprehensive Metabolic Panel:    Component Value Date/Time   NA 135 02/12/2022 1130   K 4.1 02/12/2022 1130   CL 100 02/12/2022 1130   CO2 31 02/12/2022 1130   BUN 12 02/12/2022 1130   CREATININE 0.84 02/12/2022 1130   CREATININE 1.08 08/22/2021 1346   GLUCOSE 105 (H) 02/12/2022 1130   CALCIUM 9.0 02/12/2022 1130   AST 15 02/12/2022 1130   ALT 11 02/12/2022 1130   ALKPHOS 57 02/12/2022 1130   BILITOT 0.3 02/12/2022 1130   PROT 6.7 02/12/2022 1130   ALBUMIN 3.0 (L) 02/12/2022 1130    RADIOGRAPHIC STUDIES: US Abdomen Complete  Result Date: 02/22/2022 CLINICAL DATA:  Hepatitis-B. EXAM: ABDOMEN ULTRASOUND COMPLETE COMPARISON:  10/03/2021 ultrasound abdomen FINDINGS: Gallbladder: No gallstones or wall thickening visualized. No sonographic Murphy sign noted by sonographer. Common bile duct: Diameter: 2 mm Liver: No focal lesion identified. Within normal limits in parenchymal echogenicity. Portal vein is patent on color Doppler imaging with normal direction of blood flow towards the liver. IVC: No abnormality visualized. Pancreas: Not visualized due to overlying bowel gas. Spleen: Size and appearance within normal limits. Right Kidney: Length: 10.2 cm. Echogenicity within normal limits. No mass or hydronephrosis visualized. Left Kidney: Length: 10.6 cm. Echogenicity within normal limits. No mass or hydronephrosis visualized. Abdominal aorta: No aneurysm visualized. Other findings: Left pleural effusion. IMPRESSION: 1. No cholelithiasis or sonographic evidence for acute cholecystitis. 2. Left pleural effusion. Electronically Signed   By: Lovey Newcomer M.D.   On: 02/22/2022 08:14   DG CHEST PORT 1 VIEW  Result Date: 02/06/2022 CLINICAL DATA:  61 year old male presenting for evaluation of cough and shoulder pain. EXAM: PORTABLE CHEST 1 VIEW COMPARISON:  February 05, 2022. FINDINGS: Interstitial thickening in the chest similar to previous imaging.  Opacity in the LEFT upper chest similar to previous imaging in this patient with known LEFT upper lobe pulmonary mass. Diminished volume in the LEFT chest likely related underlying neoplasm and interstitial changes. Diffuse airspace process throughout aerated portions of LEFT lung is also similar. RIGHT chest is largely clear with the exception of potential new subtle opacities at the RIGHT lung base since previous imaging. Small amount of gas in the LEFT supraclavicular fossa likely related to recent biopsy. On limited assessment no acute skeletal findings. IMPRESSION: 1. 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 Correlate with respiratory symptoms. 2. Subtle basilar opacities on the RIGHT better seen on chest CT compatible with known pulmonary nodules in this location. No new areas of lobar consolidation. 3. Postprocedural changes of biopsy in the LEFT supraclavicular region. Electronically Signed   By: Zetta Bills M.D.   On: 02/06/2022 11:54   CT CHEST W CONTRAST  Addendum Date: 02/06/2022  ADDENDUM REPORT: 02/06/2022 10:23 ADDENDUM: Images were reviewed again. There is 3 cm left supraclavicular lymph node suggesting metastatic disease. There is a lytic lesion in right glenoid consistent with skeletal metastatic disease. Electronically Signed   By: Elmer Picker M.D.   On: 02/06/2022 10:23   Addendum Date: 02/05/2022   ADDENDUM REPORT: 02/05/2022 16:15 ADDENDUM: Images were reviewed with additional clinical history. In the previous CT chest done on 10/01/2020, there is 1.5 cm pleural-based noncalcified nodule in the medial left upper lobe. In view of this prior finding, imaging findings in the left lung in the current study most likely suggest extensive malignant neoplastic process with lymphangitic spread of metastatic disease throughout left lung. Nodular densities in  the right lung suggest metastatic disease. Enlarged lymph nodes in mediastinum suggest metastatic lymphadenopathy. Follow-up PET-CT and tissue sampling should be considered. Electronically Signed   By: Elmer Picker M.D.   On: 02/05/2022 16:15   MR SHOULDER RIGHT WO CONTRAST  Result Date: 02/05/2022 CLINICAL DATA:  Right shoulder pain. Destructive lesion involving the glenoid noted on recent CT scan. EXAM: MRI OF THE RIGHT SHOULDER WITHOUT CONTRAST TECHNIQUE: Multiplanar, multisequence MR imaging of the shoulder was performed. No intravenous contrast was administered. COMPARISON:  Chest CT 02/05/2022 FINDINGS: Rotator cuff: The rotator cuff tendons are intact. Mild tendinopathy but no full-thickness retracted tear. Muscles:  No significant findings. Biceps long head:  Intact Acromioclavicular Joint: Mild degenerative changes. Type 2-3 acromion. Significant anterior downsloping of the acromion. No lateral downsloping or subacromial spurring. Glenohumeral Joint: There is a very aggressive destructive bone lesions involving the scapula, most notably in the glenoid area but also involving the coracoid process and part of the scapular body. The bone is largely destroyed and there is a large associated soft tissue mass measuring approximately 5.0 x 4.5 cm. Osteomyelitis is a consideration but I would expect to see severe surrounding myofasciitis and cellulitis which really is not present. Also, I would expect there would be septic arthritis and involvement of the humeral head which there is not. Biopsy is recommended. Labrum:  Degenerated and likely torn. Bones: Destructive scapular bone lesion as detailed above. No other bony structures are in. Other: No subacromial/subdeltoid fluid collections to suggest bursitis. Enlarged right axillary lymph node is noted. IMPRESSION: 1. Destructive scapular bone lesion with associated large soft tissue mass measuring approximately 5.0 x 4.5 cm. Although osteomyelitis is a  consideration I think it is unlikely and more likely a neoplastic process. 2. Biopsy is recommended. 3. Intact rotator cuff tendons and long head biceps tendon. Electronically Signed   By: Marijo Sanes M.D.   On: 02/05/2022 20:09   DG Shoulder Right Port  PERFORMANCE STATUS (ECOG) : 1 - Symptomatic but completely ambulatory  Review of Systems  Constitutional:  Positive for fatigue and unexpected weight change.  Musculoskeletal:  Positive for arthralgias.  Unless otherwise noted, a complete review of systems is negative.  Physical Exam General: NAD, cachectic  Cardiovascular: regular rate and rhythm Pulmonary:diminished, normal breathing pattern, congested cough Extremities: no edema, no joint deformities Skin: no rashes, thin, muscle wasting  Neurological: AAO x3, mood appropriate   IMPRESSION: This is my initial visit with Steven Johnston. No acute distress noted. Congested cough and raspy voice. No family present. Alert and able to engage appropriately in discussions.   I introduced myself, [place] RN, and Palliative's role in collaboration with the oncology team. Concept of Palliative Care was introduced as specialized medical care for people and their families living with serious  illness.  It focuses on providing relief from the symptoms and stress of a serious illness.  The goal is to improve quality of life for both the patient and the family. Values and goals of care important to patient and family were attempted to be elicited.   Steven Johnston lives at home with his 67 year old mother. He has 2 children (88 and 63 year old). He is a former Engineer, materials. He served in the [employer].   Able to perform all ADLs independently. No assistive devices. Does require frequent rest breaks in the setting of ongoing fatigue and shortness of breath with exertion. Ongoing congested cough that keeps him up at night.   Neoplasm related pain  Steven Johnston complains of ongoing right shoulder pain  that radiates down his right arm, some sternal chest discomfort, and back pain. This has been ongoing for months. He was started on Oxycodone per Dr. Julien Nordmann. Patient reports pain is a 8-9/10 and decreases to 2-3/10 with medication. He is taking as prescribed.   I had extensive discussion with patient regarding his pain and regimen. Clear guidelines discussed regarding pain contract and expectations. Barrett verbalized understanding and appreciation. We reviewed the pain contract in detail reading each request. He willingly initialed and signed with understanding any breech in contract will result in no further opioid prescriptions from our palliative team. A copy of signed contract also provided to patient.   He is scheduled to start radiation this week. Understands this will also hopefully assist in improving his pain. We will continue on regimen with oxycodone every 8 hours. If pain continues to persist will consider more long-acting regimen in the future.   Denies concerns with constipation. Encourage to take stool softener if no bowel movement in more than 48 hours.   Steven Johnston is remaining hopeful for some stability. He is emotional about his recent diagnosis. Is realistic in his understanding. Emotional support provided.   I discussed the importance of continued conversation with family and their medical providers regarding overall plan of care and treatment options, ensuring decisions are within the context of the patients values and GOCs.            PLAN: Established therapeutic relationship. Education provided on palliative's role in collaboration with their Oncology/Radiation team. Extensive discussion on expectations on pain management. Contract reviewed and signed. Patient provided with copy.  Oxycodone 5mg  every 8 hours as needed  Tessalon Perles three times daily as needed for cough  Ongoing symptom management and goals of care  I will plan to see patient back in 2-4  weeks in collaboration to other oncology appointments.    Patient expressed understanding and was in agreement with this plan. He also understands that He can call the clinic at any time with any questions, concerns, or complaints.   Thank you for your referral and allowing Palliative to assist in StevenJessica Pen's care.   Number and complexity of problems addressed: HIGH - 1 or more chronic illnesses with SEVERE exacerbation, progression, or side effects of treatment - advanced cancer, pain. Any controlled substances utilized were prescribed in the context of palliative care.  Time Total: 45 min   Visit consisted of counseling and education dealing with the complex and emotionally intense issues of symptom management and palliative care in the setting of serious and potentially life-threatening illness.Greater than 50%  of this time was spent counseling and coordinating care related to the above assessment and plan.  Signed by: Alda Lea, AGPCNP-BC Palliative  [place]

## 2022-02-25 NOTE — Telephone Encounter (Signed)
Pt called stating he missed a call. I have reviewed chart and see no notes that a person here in the [facility] attempted to contact him. He was advised it likely was a appointment reminder call because he has appts on Thursday.  Pt also does not have a PCP. I have provided him with the contact number to the Physician Referral line and strongly encouraged the pt to obtain a PCP. Pt expressed understanding of this information.

## 2022-02-26 ENCOUNTER — Ambulatory Visit (HOSPITAL_COMMUNITY)
Admission: RE | Admit: 2022-02-26 | Discharge: 2022-02-26 | Disposition: A | Discharge status: Home / Self Care | Payer: Commercial Managed Care - HMO | Source: Ambulatory Visit | Attending: Internal Medicine | Admitting: Internal Medicine

## 2022-02-26 ENCOUNTER — Encounter (HOSPITAL_COMMUNITY): Payer: Self-pay

## 2022-02-26 DIAGNOSIS — C3490 Malignant neoplasm of unspecified part of unspecified bronchus or lung: Secondary | ICD-10-CM | POA: Diagnosis not present

## 2022-02-26 DIAGNOSIS — Z510 Encounter for antineoplastic radiation therapy: Secondary | ICD-10-CM | POA: Diagnosis not present

## 2022-02-26 LAB — GLUCOSE, CAPILLARY: Glucose-Capillary: 77 mg/dL (ref 70–99)

## 2022-02-26 MED ORDER — GADOPICLENOL 0.5 MMOL/ML IV SOLN
5.0000 mL | Freq: Once | INTRAVENOUS | Status: AC | PRN
Start: 2022-02-26 — End: 2022-02-26
  Administered 2022-02-26: 5 mL via INTRAVENOUS

## 2022-02-26 MED ORDER — FLUDEOXYGLUCOSE F - 18 (FDG) INJECTION
6.2500 | Freq: Once | INTRAVENOUS | Status: AC
  Administered 2022-02-26: 6.25 via INTRAVENOUS

## 2022-02-27 ENCOUNTER — Ambulatory Visit
Admission: RE | Admit: 2022-02-27 | Discharge: 2022-02-27 | Disposition: A | Discharge status: Home / Self Care | Payer: Commercial Managed Care - HMO | Source: Ambulatory Visit | Attending: Radiation Oncology | Admitting: Radiation Oncology

## 2022-02-27 ENCOUNTER — Other Ambulatory Visit: Payer: Self-pay

## 2022-02-27 ENCOUNTER — Telehealth: Payer: Self-pay | Admitting: Medical Oncology

## 2022-02-27 ENCOUNTER — Inpatient Hospital Stay: Payer: Commercial Managed Care - HMO

## 2022-02-27 ENCOUNTER — Inpatient Hospital Stay: Payer: Commercial Managed Care - HMO | Admitting: Internal Medicine

## 2022-02-27 DIAGNOSIS — C3490 Malignant neoplasm of unspecified part of unspecified bronchus or lung: Secondary | ICD-10-CM

## 2022-02-27 DIAGNOSIS — Z510 Encounter for antineoplastic radiation therapy: Secondary | ICD-10-CM | POA: Diagnosis not present

## 2022-02-27 LAB — RAD ONC ARIA SESSION SUMMARY
Course Elapsed Days: 0
Plan Fractions Treated to Date: 1
Plan Prescribed Dose Per Fraction: 3 Gy
Plan Total Fractions Prescribed: 10
Plan Total Prescribed Dose: 30 Gy
Reference Point Dosage Given to Date: 3 Gy
Reference Point Session Dosage Given: 3 Gy
Session Number: 1

## 2022-02-27 NOTE — Telephone Encounter (Signed)
Pt called back. He did not listen to his VM. He was advised he missed his appts this morning. Pt states he was unaware, however when he reviewed his AVS, he located the appts.  Pt was advised we will send a message to scheduling to get him rescheduled.

## 2022-02-27 NOTE — Telephone Encounter (Signed)
LVM on Ester's phone to return my call.

## 2022-02-28 ENCOUNTER — Ambulatory Visit
Admission: RE | Admit: 2022-02-28 | Discharge: 2022-02-28 | Disposition: A | Discharge status: Home / Self Care | Payer: Commercial Managed Care - HMO | Source: Ambulatory Visit | Attending: Radiation Oncology | Admitting: Radiation Oncology

## 2022-02-28 ENCOUNTER — Ambulatory Visit: Payer: Commercial Managed Care - HMO

## 2022-02-28 ENCOUNTER — Other Ambulatory Visit: Payer: Self-pay

## 2022-02-28 DIAGNOSIS — Z510 Encounter for antineoplastic radiation therapy: Secondary | ICD-10-CM | POA: Diagnosis not present

## 2022-02-28 LAB — RAD ONC ARIA SESSION SUMMARY
Course Elapsed Days: 1
Plan Fractions Treated to Date: 2
Plan Prescribed Dose Per Fraction: 3 Gy
Plan Total Fractions Prescribed: 10
Plan Total Prescribed Dose: 30 Gy
Reference Point Dosage Given to Date: 6 Gy
Reference Point Session Dosage Given: 3 Gy
Session Number: 2

## 2022-03-03 ENCOUNTER — Other Ambulatory Visit: Payer: Self-pay

## 2022-03-03 ENCOUNTER — Ambulatory Visit: Payer: Commercial Managed Care - HMO

## 2022-03-03 ENCOUNTER — Ambulatory Visit
Admission: RE | Admit: 2022-03-03 | Discharge: 2022-03-03 | Disposition: A | Discharge status: Home / Self Care | Payer: Commercial Managed Care - HMO | Source: Ambulatory Visit | Attending: Radiation Oncology | Admitting: Radiation Oncology

## 2022-03-03 DIAGNOSIS — Z510 Encounter for antineoplastic radiation therapy: Secondary | ICD-10-CM | POA: Insufficient documentation

## 2022-03-03 DIAGNOSIS — Z809 Family history of malignant neoplasm, unspecified: Secondary | ICD-10-CM | POA: Insufficient documentation

## 2022-03-03 DIAGNOSIS — Z800 Family history of malignant neoplasm of digestive organs: Secondary | ICD-10-CM | POA: Insufficient documentation

## 2022-03-03 DIAGNOSIS — C7931 Secondary malignant neoplasm of brain: Secondary | ICD-10-CM | POA: Diagnosis not present

## 2022-03-03 DIAGNOSIS — Z87891 Personal history of nicotine dependence: Secondary | ICD-10-CM | POA: Diagnosis not present

## 2022-03-03 DIAGNOSIS — J90 Pleural effusion, not elsewhere classified: Secondary | ICD-10-CM | POA: Diagnosis not present

## 2022-03-03 DIAGNOSIS — G893 Neoplasm related pain (acute) (chronic): Secondary | ICD-10-CM | POA: Diagnosis not present

## 2022-03-03 DIAGNOSIS — Z79624 Long term (current) use of inhibitors of nucleotide synthesis: Secondary | ICD-10-CM | POA: Diagnosis not present

## 2022-03-03 DIAGNOSIS — F17210 Nicotine dependence, cigarettes, uncomplicated: Secondary | ICD-10-CM | POA: Insufficient documentation

## 2022-03-03 DIAGNOSIS — Z791 Long term (current) use of non-steroidal anti-inflammatories (NSAID): Secondary | ICD-10-CM | POA: Insufficient documentation

## 2022-03-03 DIAGNOSIS — J439 Emphysema, unspecified: Secondary | ICD-10-CM | POA: Diagnosis not present

## 2022-03-03 DIAGNOSIS — K760 Fatty (change of) liver, not elsewhere classified: Secondary | ICD-10-CM | POA: Insufficient documentation

## 2022-03-03 DIAGNOSIS — Z5112 Encounter for antineoplastic immunotherapy: Secondary | ICD-10-CM | POA: Diagnosis not present

## 2022-03-03 DIAGNOSIS — Z79899 Other long term (current) drug therapy: Secondary | ICD-10-CM | POA: Diagnosis not present

## 2022-03-03 DIAGNOSIS — C3412 Malignant neoplasm of upper lobe, left bronchus or lung: Secondary | ICD-10-CM | POA: Diagnosis present

## 2022-03-03 DIAGNOSIS — Z923 Personal history of irradiation: Secondary | ICD-10-CM | POA: Diagnosis not present

## 2022-03-03 DIAGNOSIS — K5900 Constipation, unspecified: Secondary | ICD-10-CM | POA: Diagnosis not present

## 2022-03-03 DIAGNOSIS — C3431 Malignant neoplasm of lower lobe, right bronchus or lung: Secondary | ICD-10-CM | POA: Insufficient documentation

## 2022-03-03 DIAGNOSIS — C7802 Secondary malignant neoplasm of left lung: Secondary | ICD-10-CM | POA: Diagnosis not present

## 2022-03-03 DIAGNOSIS — I2510 Atherosclerotic heart disease of native coronary artery without angina pectoris: Secondary | ICD-10-CM | POA: Diagnosis not present

## 2022-03-03 DIAGNOSIS — M25511 Pain in right shoulder: Secondary | ICD-10-CM | POA: Insufficient documentation

## 2022-03-03 DIAGNOSIS — Z21 Asymptomatic human immunodeficiency virus [HIV] infection status: Secondary | ICD-10-CM | POA: Diagnosis not present

## 2022-03-03 DIAGNOSIS — C7951 Secondary malignant neoplasm of bone: Secondary | ICD-10-CM | POA: Insufficient documentation

## 2022-03-03 DIAGNOSIS — R112 Nausea with vomiting, unspecified: Secondary | ICD-10-CM | POA: Diagnosis not present

## 2022-03-03 DIAGNOSIS — Z1501 Genetic susceptibility to malignant neoplasm of breast: Secondary | ICD-10-CM | POA: Diagnosis not present

## 2022-03-03 DIAGNOSIS — R059 Cough, unspecified: Secondary | ICD-10-CM | POA: Insufficient documentation

## 2022-03-03 DIAGNOSIS — K449 Diaphragmatic hernia without obstruction or gangrene: Secondary | ICD-10-CM | POA: Diagnosis not present

## 2022-03-03 LAB — RAD ONC ARIA SESSION SUMMARY
Course Elapsed Days: 4
Plan Fractions Treated to Date: 3
Plan Prescribed Dose Per Fraction: 3 Gy
Plan Total Fractions Prescribed: 10
Plan Total Prescribed Dose: 30 Gy
Reference Point Dosage Given to Date: 9 Gy
Reference Point Session Dosage Given: 3 Gy
Session Number: 3

## 2022-03-04 ENCOUNTER — Other Ambulatory Visit: Payer: Self-pay

## 2022-03-04 ENCOUNTER — Inpatient Hospital Stay: Payer: Commercial Managed Care - HMO | Attending: Internal Medicine

## 2022-03-04 ENCOUNTER — Ambulatory Visit: Payer: Commercial Managed Care - HMO

## 2022-03-04 ENCOUNTER — Ambulatory Visit
Admission: RE | Admit: 2022-03-04 | Discharge: 2022-03-04 | Disposition: A | Discharge status: Home / Self Care | Payer: Commercial Managed Care - HMO | Source: Ambulatory Visit | Attending: Radiation Oncology | Admitting: Radiation Oncology

## 2022-03-04 DIAGNOSIS — Z923 Personal history of irradiation: Secondary | ICD-10-CM | POA: Insufficient documentation

## 2022-03-04 DIAGNOSIS — Z21 Asymptomatic human immunodeficiency virus [HIV] infection status: Secondary | ICD-10-CM | POA: Insufficient documentation

## 2022-03-04 DIAGNOSIS — Z79899 Other long term (current) drug therapy: Secondary | ICD-10-CM | POA: Insufficient documentation

## 2022-03-04 DIAGNOSIS — R112 Nausea with vomiting, unspecified: Secondary | ICD-10-CM | POA: Insufficient documentation

## 2022-03-04 DIAGNOSIS — C3412 Malignant neoplasm of upper lobe, left bronchus or lung: Secondary | ICD-10-CM | POA: Insufficient documentation

## 2022-03-04 DIAGNOSIS — C7951 Secondary malignant neoplasm of bone: Secondary | ICD-10-CM | POA: Insufficient documentation

## 2022-03-04 DIAGNOSIS — K449 Diaphragmatic hernia without obstruction or gangrene: Secondary | ICD-10-CM | POA: Insufficient documentation

## 2022-03-04 DIAGNOSIS — G893 Neoplasm related pain (acute) (chronic): Secondary | ICD-10-CM | POA: Insufficient documentation

## 2022-03-04 DIAGNOSIS — Z87891 Personal history of nicotine dependence: Secondary | ICD-10-CM | POA: Insufficient documentation

## 2022-03-04 DIAGNOSIS — C7931 Secondary malignant neoplasm of brain: Secondary | ICD-10-CM | POA: Insufficient documentation

## 2022-03-04 DIAGNOSIS — C7802 Secondary malignant neoplasm of left lung: Secondary | ICD-10-CM | POA: Insufficient documentation

## 2022-03-04 DIAGNOSIS — Z5112 Encounter for antineoplastic immunotherapy: Secondary | ICD-10-CM | POA: Insufficient documentation

## 2022-03-04 DIAGNOSIS — I2510 Atherosclerotic heart disease of native coronary artery without angina pectoris: Secondary | ICD-10-CM | POA: Insufficient documentation

## 2022-03-04 DIAGNOSIS — J439 Emphysema, unspecified: Secondary | ICD-10-CM | POA: Insufficient documentation

## 2022-03-04 DIAGNOSIS — Z791 Long term (current) use of non-steroidal anti-inflammatories (NSAID): Secondary | ICD-10-CM | POA: Insufficient documentation

## 2022-03-04 DIAGNOSIS — J90 Pleural effusion, not elsewhere classified: Secondary | ICD-10-CM | POA: Insufficient documentation

## 2022-03-04 DIAGNOSIS — Z79624 Long term (current) use of inhibitors of nucleotide synthesis: Secondary | ICD-10-CM | POA: Insufficient documentation

## 2022-03-04 DIAGNOSIS — K5900 Constipation, unspecified: Secondary | ICD-10-CM | POA: Insufficient documentation

## 2022-03-04 DIAGNOSIS — K760 Fatty (change of) liver, not elsewhere classified: Secondary | ICD-10-CM | POA: Insufficient documentation

## 2022-03-04 DIAGNOSIS — Z1501 Genetic susceptibility to malignant neoplasm of breast: Secondary | ICD-10-CM | POA: Insufficient documentation

## 2022-03-04 LAB — RAD ONC ARIA SESSION SUMMARY
Course Elapsed Days: 5
Plan Fractions Treated to Date: 4
Plan Prescribed Dose Per Fraction: 3 Gy
Plan Total Fractions Prescribed: 10
Plan Total Prescribed Dose: 30 Gy
Reference Point Dosage Given to Date: 12 Gy
Reference Point Session Dosage Given: 3 Gy
Session Number: 4

## 2022-03-04 NOTE — Progress Notes (Signed)
[facility] OFFICE PROGRESS NOTE  Patient, No Pcp Per No address on file  DIAGNOSIS: Stage IVb (T3, N3, M1 C) non-small cell lung cancer, adenocarcinoma presented with large left upper lobe lung mass in addition to lymphangitic carcinomatosis, widespread nodal disease in the chest and abdomen, bony involvement in the right scapula and proximal femur with muscular involvement and cutaneous involvement, and metastatic disease to the brain. There is a spiculated nodule in the right chest without increased metabolic activity which could represent indolent synchronous primary. Diagnosed in September 2023.  [employer] Biomarkers: MET amplification and BRCA2 mutation.  PDL1 Expression: 93%   PRIOR THERAPY: None  CURRENT THERAPY: 1) palliative radiation to the painful metastatic scapular lesion under the care of Dr. Sondra Come.  Last day radiation scheduled for 03/12/2022 2) SRS to the metastatic brain lesions under the care of Dr. Sondra Come. TBD 3) Palliative systemic immunotherapy with Libtayo IV every 3 weeks. First dose on 03/13/22.   INTERVAL HISTORY: Steven Johnston 61 y.o. male returns to the clinic today for a follow-up visit.  The patient was unfortunately recently diagnosed with stage IV non-small cell lung cancer, adenocarcinoma.  The patient is currently undergoing palliative radiation to the painful scapular lesion under the care of Dr. Sondra Come.  His last day radiation is tentatively scheduled for 03/12/2022.  In the interval since last being seen, the patient is also established with palliative care.  He is currently undergoing pain management with oxycodone every 8 hours. He does report his pain is somewhat improved since starting radiation; however, he does need to take his oxycodone every 8 hours.   When the patient was last seen by Dr. Julien Nordmann on 02/12/22, the patient need to complete the staging work-up.  He had a brain MRI and a PET scan performed last week.  Unfortunately, the  patient's brain MRI showed evidence of metastatic disease to the brain.    The patient had molecular studies which did not show any actionable mutations in the first line setting. He has MET amplification which can be targeted in the second line setting. He also has a BRCA2 mutation. The patient mentions his brother had pancreatic cancer. The patient has two children. He would be interested in a referral to genetic counseling.   Otherwise since last being seen, the patient denies any changes in his health besides weight loss. He reports his baseline weight used to be 163 lbs. He is  121 lbs today. He would be interested in seeing a member of the nutritionist team. He drinks ensures 3x per day. He lost about 4 lbs since last week.    Regarding his breathing, the patient continues to have a dry cough.  The patient has been taking Tessalon.  He has not noticed an appreciable improvement in his cough with Tessalon.  He sometimes tries to take Mucinex.  He reports dyspnea with exertion and with certain activities that require straining such as urinating.  The patient denies any nausea, vomiting, diarrhea, or constipation.  He denies any headache or visual changes.  The patient is here today for evaluation and more detailed discussion about his current condition and recommended treatment options.    MEDICAL HISTORY: Past Medical History:  Diagnosis Date   Hernia, inguinal, right    HIV (human immunodeficiency virus infection) ([place])     ALLERGIES:  has No Known Allergies.  MEDICATIONS:  Current Outpatient Medications  Medication Sig Dispense Refill   albuterol (VENTOLIN HFA) 108 (90 Base) MCG/ACT inhaler Inhale 2  puffs into the lungs every 6 (six) hours as needed for wheezing or shortness of breath. 6.7 g 1   benzonatate (TESSALON) 100 MG capsule Take 2 capsules (200 mg total) by mouth 3 (three) times daily as needed for cough. 30 capsule 0   bictegravir-emtricitabine-tenofovir AF (BIKTARVY)  50-200-25 MG TABS tablet Take 1 tablet by mouth daily. 30 tablet 5   diclofenac Sodium (VOLTAREN) 1 % GEL Apply 2 g topically 4 (four) times daily. 100 g 0   mirtazapine (REMERON) 7.5 MG tablet Take 1 tablet (7.5 mg total) by mouth at bedtime. 30 tablet 2   nicotine (NICODERM CQ - DOSED IN MG/24 HOURS) 14 mg/24hr patch Place 1 patch (14 mg total) onto the skin daily. 28 patch 0   oxyCODONE (ROXICODONE) 5 MG immediate release tablet Take 1 tablet (5 mg total) by mouth every 8 (eight) hours as needed. 45 tablet 0   prochlorperazine (COMPAZINE) 10 MG tablet Take 1 tablet (10 mg total) by mouth every 6 (six) hours as needed. 30 tablet 2   No current facility-administered medications for this visit.    SURGICAL HISTORY:  Past Surgical History:  Procedure Laterality Date   BRONCHIAL WASHINGS  10/05/2020   Procedure: BRONCHIAL WASHINGS;  Surgeon: Julian Hy, DO;  Location: [facility];  Service: Endoscopy;;   INGUINAL HERNIA REPAIR Right 02/17/2018   Procedure: OPEN HERNIA REPAIR RIGHT INGUINAL INCARCERATED WITH DIAGONSTIC LAPAROSCOPY FOR INCARCERATED HERNIA;  Surgeon: Ileana Roup, MD;  Location: [facility];  Service: General;  Laterality: Right;   VIDEO BRONCHOSCOPY N/A 10/05/2020   Procedure: VIDEO BRONCHOSCOPY WITHOUT FLUORO;  Surgeon: Julian Hy, DO;  Location: [facility];  Service: Endoscopy;  Laterality: N/A;    REVIEW OF SYSTEMS:   Review of Systems  Constitutional: Positive for fatigue, decreased appetite, weight loss.  Negative for chills and fever.  HENT: Negative for mouth sores, nosebleeds, sore throat and trouble swallowing.   Eyes: Negative for eye problems and icterus.  Respiratory: Positive for cough and dyspnea on exertion.  Negative for hemoptysis and wheezing.   Cardiovascular: Negative for chest pain and leg swelling.  Gastrointestinal: Negative for abdominal pain, constipation, diarrhea, nausea and vomiting.  Genitourinary: Negative for bladder incontinence,  difficulty urinating, dysuria, frequency and hematuria.   Musculoskeletal: Positive for right scapular pain.  Negative for back pain, gait problem, neck pain and neck stiffness.  Skin: Negative for itching and rash.  Neurological: Negative for dizziness, extremity weakness, gait problem, headaches, light-headedness and seizures.  Hematological: Negative for adenopathy. Does not bruise/bleed easily.  Psychiatric/Behavioral: Negative for confusion, depression and sleep disturbance. The patient is not nervous/anxious.     PHYSICAL EXAMINATION:  Blood pressure 109/69, pulse 90, temperature 97.9 F (36.6 C), temperature source Oral, resp. rate 17, weight 121 lb (54.9 kg), SpO2 90 %.  ECOG PERFORMANCE STATUS: 1-2  Physical Exam  Constitutional: Oriented to person, place, and time and cachectic appearing male and in no distress.  HENT:  Head: Normocephalic and atraumatic.  Mouth/Throat: Oropharynx is clear and moist. No oropharyngeal exudate.  Eyes: Conjunctivae are normal. Right eye exhibits no discharge. Left eye exhibits no discharge. No scleral icterus.  Neck: Normal range of motion. Neck supple.  Cardiovascular: Normal rate, regular rhythm, normal heart sounds and intact distal pulses.   Pulmonary/Chest: Effort normal and breath sounds normal. No respiratory distress. No wheezes. No rales.  Abdominal: Soft. Bowel sounds are normal. Exhibits no distension and no mass. There is no tenderness.  Musculoskeletal: Normal range of  motion. Exhibits no edema.  Lymphadenopathy:  Left supraclavicular lymphadenopathy. Neurological: Alert and oriented to person, place, and time. Exhibits also wasting gait normal. Coordination normal.  Skin: Skin is warm and dry. No rash noted. Not diaphoretic. No erythema. No pallor.  Psychiatric: Mood, memory and judgment normal.  Vitals reviewed.  LABORATORY DATA: Lab Results  Component Value Date   WBC 10.9 (H) 03/06/2022   HGB 11.4 (L) 03/06/2022   HCT 36.2  (L) 03/06/2022   MCV 78.7 (L) 03/06/2022   PLT 596 (H) 03/06/2022      Chemistry      Component Value Date/Time   NA 138 03/06/2022 1124   K 4.3 03/06/2022 1124   CL 104 03/06/2022 1124   CO2 30 03/06/2022 1124   BUN 13 03/06/2022 1124   CREATININE 0.75 03/06/2022 1124   CREATININE 1.08 08/22/2021 1346      Component Value Date/Time   CALCIUM 8.3 (L) 03/06/2022 1124   ALKPHOS 66 03/06/2022 1124   AST 15 03/06/2022 1124   ALT 8 03/06/2022 1124   BILITOT 0.3 03/06/2022 1124       RADIOGRAPHIC STUDIES:  MR BRAIN W WO CONTRAST  Result Date: 02/27/2022 CLINICAL DATA:  Non-small cell lung cancer, staging EXAM: MRI HEAD WITHOUT AND WITH CONTRAST TECHNIQUE: Multiplanar, multiecho pulse sequences of the brain and surrounding structures were obtained without and with intravenous contrast. CONTRAST:  5 mL Vueway COMPARISON:  No prior MRI, correlation is made with CT head 10/13/2020 FINDINGS: Evaluation is quite limited by motion, particularly the postcontrast T1 sequences. In addition, suboptimal, faster sequences had to be run to minimize the affect of motion. Brain: Round enhancing lesion in the left inferolateral frontal lobe, which measures 10 x 10 x 9 mm (AP x TR x CC) (series 16, image 17 and series 17, image 20). This lesion is associated with significant surrounding edema and mild local mass effect (series 9, image 29). It is also associated with a focus of restricted diffusion (series 5, image 26). Additional 7 x 3 x 4 mm enhancing lesion in the posterior right frontal lobe (series 16, image 20 and series 18, image 8), which is associated with restricted diffusion (series 5, image 44) and edema (series 9, image 43). No other definite enhancing lesion is seen, although motion significantly limits evaluation for small lesions; however, there are small foci of restricted diffusion in the left thalamus (series 5, image 28), left parietal lobe (series 5, image 32) and right occipital lobe  (series 5, image 26). These areas are associated with minimally increased T2 signal and could represent additional small metastases, the enhancement of which is obscured by motion, or acute infarcts. No hydrocephalus or extra-axial collection.  No midline shift. Vascular: Grossly normal arterial flow voids. Skull and upper cervical spine: Grossly normal marrow signal, although this is significantly motion limited. Sinuses/Orbits: Mucosal thickening in the right frontal sinus. The orbits are unremarkable. Other: The mastoids are well aerated. IMPRESSION: 1. Evaluation is limited by motion, particularly the postcontrast T1 sequences. Within this limitation, there are two enhancing lesions in the left frontal lobe and right posterior frontal lobe, which are associated with restricted diffusion and edema, concerning for metastatic disease. 2. Small foci of restricted diffusion in the left thalamus, left parietal lobe, and right occipital lobe, which are associated with minimally increased T2 signal and could represent additional small metastases, although evaluation for enhancement is obscured by motion, versus acute/subacute infarcts. Attention on follow-up. Electronically Signed   By: Bryson Ha  Vasan M.D.   On: 02/27/2022 20:00   NM PET Image Initial (PI) Skull Base To Thigh (F-18 FDG)  Result Date: 02/27/2022 CLINICAL DATA:  Initial treatment strategy for non-small cell lung cancer staging evaluation. EXAM: NUCLEAR MEDICINE PET SKULL BASE TO THIGH TECHNIQUE: 6.25 mCi F-18 FDG was injected intravenously. Full-ring PET imaging was performed from the skull base to thigh after the radiotracer. CT data was obtained and used for attenuation correction and anatomic localization. Fasting blood glucose: 77 mg/dl COMPARISON:  CT of the chest February 05, 2022 FINDINGS: Mediastinal blood pool activity: SUV max 1.78 Liver activity: SUV max NA NECK: Level III lymph nodes (image 43/4) LEFT-sided lymph nodes measuring  approximately 13 mm with a maximum SUV of 6.23. RIGHT level IV lymph nodes with similar activity measuring less than a cm. Supraclavicular lymph nodes bilaterally. Largest on the LEFT with a maximum SUV of 6.23 is indistinct and difficult to measure likely 13 mm short axis. Incidental CT findings: None. CHEST: Extensive consolidative changes more confluent in the LEFT upper lobe than on recent imaging (image 70/4) confluent consolidative changes with marked hypermetabolic activity measuring 10.1 x 6.3 cm with a maximum SUV of 14.46. Scattered additional areas are contiguous with this LEFT upper lobe process, extending into the lingula and superior segment of the LEFT lower lobe and associated with diffuse septal thickening and nodularity also with areas of marked hypermetabolic activity throughout the more densely consolidative areas in the chest and with mild-to-moderate activity in patchy areas of nodularity. Spiculated RIGHT lower lobe pulmonary nodule 7 mm (image 93/4) not substantially hypermetabolic with SUV of 6.29. Extensive mediastinal and bilateral hilar adenopathy as well as supraclavicular adenopathy discussed above. Lymph nodes track along the entire RIGHT supraclavicular region and there is adjacent destruction of the RIGHT scapula. (Image 72/4) maximum SUV of 10.58 of a 14 mm RIGHT paratracheal lymph node. Enlargement of RIGHT hilar lymph nodes with maximum SUV of 8.71. Subcarinal and RIGHT peribronchial nodal enlargement with subcarinal nodal tissue showing a maximum SUV of 9.15 measuring approximately 11 mm greatest thickness. RIGHT axillary adenopathy with increased metabolic activity similar to supraclavicular activity seen in the RIGHT supraclavicular region. Incidental CT findings: Mass in the LEFT upper lobe and lingula inseparable from the LEFT heart border and LEFT mediastinal border likely invading mediastinal fat. No substantial pericardial effusion. Three-vessel coronary artery disease. No  aortic dilation. Background pulmonary emphysema is moderate and worse at the lung apices. Airways to the RIGHT chest are patent. ABDOMEN/PELVIS: Upper abdominal adenopathy and retroperitoneal adenopathy with increased metabolic activity. Hepatic gastric lymph node (image 121/4) maximum SUV 6.93 measuring 14 mm short axis. Similar lymph node to the RIGHT of the celiac axis showing similar increased metabolic activity. LEFT para-aortic nodal tissue (image 132/4) this measures 12 mm with a maximum SUV of 7.70. Lymph nodes track into the upper pelvis along the LEFT common iliac chain and into the RIGHT upper pelvis as well. The show similar increased metabolic activity. (Image 151/4) maximum SUV 9.42 of nodal tissue along the LEFT common iliac chain measuring 1.4 cm. No discrete solid organ metastasis. Muscular metastasis in the quadratus lumborum on image 121/4 less than a cm size with a maximum SUV of 4.78. Incidental CT findings: Paucity of retroperitoneal and intra-abdominal fat limits assessment. No acute findings in the abdomen or pelvis. SKELETON: Destructive changes about the RIGHT scapula with lucent area in the RIGHT scapular glenoid measuring 2.5 x 1.6 cm showing a maximum SUV of 13.72. Bony involvement is  more extensive than the lytic change which is present in the glenoid based on FDG uptake which involves the entire lateral scapula. There is also lytic change in the RIGHT humeral head which does not display increased metabolic activity. 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 Cutaneous lesion (image 95/4) maximum SUV 8.72 area measuring approximally 2.6 cm greatest axial dimension. LEFT quadratus lumborum involvement. (Image 191/4) destructive changes along the anterior RIGHT femoral neck with a maximum SUV of 6.5. Lytic change in this location measuring 11 point 0 mm. Incidental CT findings: None. IMPRESSION: Diffuse pneumonic bronchogenic neoplasm in the LEFT chest with  extensive lymphangitic carcinomatosis and likely associated with an element of postobstructive change. Widespread nodal disease in the chest and abdomen. Signs of bony involvement of the RIGHT scapula and RIGHT proximal femur with muscular involvement and cutaneous involvement also as discussed above. Spiculated nodule in the RIGHT chest without increased metabolic activity could represent an indolent synchronous primary, attention on follow-up. Lytic appearing changes in RIGHT proximal humerus without increased metabolic activity. This could represent patchy demineralization from disuse osteopenia. Suggest attention on follow-up. Pulmonary emphysema. Coronary artery calcification. Electronically Signed   By: Zetta Bills M.D.   On: 02/27/2022 14:58   US Abdomen Complete  Result Date: 02/22/2022 CLINICAL DATA:  Hepatitis-B. EXAM: ABDOMEN ULTRASOUND COMPLETE COMPARISON:  10/03/2021 ultrasound abdomen FINDINGS: Gallbladder: No gallstones or wall thickening visualized. No sonographic Murphy sign noted by sonographer. Common bile duct: Diameter: 2 mm Liver: No focal lesion identified. Within normal limits in parenchymal echogenicity. Portal vein is patent on color Doppler imaging with normal direction of blood flow towards the liver. IVC: No abnormality visualized. Pancreas: Not visualized due to overlying bowel gas. Spleen: Size and appearance within normal limits. Right Kidney: Length: 10.2 cm. Echogenicity within normal limits. No mass or hydronephrosis visualized. Left Kidney: Length: 10.6 cm. Echogenicity within normal limits. No mass or hydronephrosis visualized. Abdominal aorta: No aneurysm visualized. Other findings: Left pleural effusion. IMPRESSION: 1. No cholelithiasis or sonographic evidence for acute cholecystitis. 2. Left pleural effusion. Electronically Signed   By: Lovey Newcomer M.D.   On: 02/22/2022 08:14   DG CHEST PORT 1 VIEW  Result Date: 02/06/2022 CLINICAL DATA:  61 year old male presenting  for evaluation of cough and shoulder pain. EXAM: PORTABLE CHEST 1 VIEW COMPARISON:  February 05, 2022. FINDINGS: Interstitial thickening in the chest similar to previous imaging. Opacity in the LEFT upper chest similar to previous imaging in this patient with known LEFT upper lobe pulmonary mass. Diminished volume in the LEFT chest likely related underlying neoplasm and interstitial changes. Diffuse airspace process throughout aerated portions of LEFT lung is also similar. RIGHT chest is largely clear with the exception of potential new subtle opacities at the RIGHT lung base since previous imaging. Small amount of gas in the LEFT supraclavicular fossa likely related to recent biopsy. On limited assessment no acute skeletal findings. IMPRESSION: 1. 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 Correlate with respiratory symptoms. 2. Subtle basilar opacities on the RIGHT better seen on chest CT compatible with known pulmonary nodules in this location. No new areas of lobar consolidation. 3. Postprocedural changes of biopsy in the LEFT supraclavicular region. Electronically Signed   By: Zetta Bills M.D.   On: 02/06/2022 11:54   [place] CORE BIOPSY (LYMPH NODES)  Result Date: 02/06/2022 INDICATION: Concern for metastatic lung cancer please perform ultrasound-guided biopsy of left  supraclavicular nodal mass for tissue diagnostic purposes. EXAM: ULTRASOUND-GUIDED LEFT SUPRACLAVICULAR LYMPH NODE BIOPSY COMPARISON:  Chest CT-02/05/2022 MEDICATIONS: None ANESTHESIA/SEDATION: None COMPLICATIONS: None immediate. TECHNIQUE: Informed written consent was obtained from the patient after a discussion of the risks, benefits and alternatives to treatment. Questions regarding the procedure were encouraged and answered. Initial ultrasound scanning demonstrated bulky left supraclavicular nodal conglomeration  with dominant component measuring approximately 3.7 x 3.0 cm, similar to preceding chest CT image 22, series 3. An ultrasound image was saved for documentation purposes. The procedure was planned. A timeout was performed prior to the initiation of the procedure. The operative was prepped and draped in the usual sterile fashion, and a sterile drape was applied covering the operative field. A timeout was performed prior to the initiation of the procedure. Local anesthesia was provided with 1% lidocaine with epinephrine. Under direct ultrasound guidance, an 18 gauge core needle device was utilized to obtain to obtain 6 core needle biopsies of the left supraclavicular nodal conglomeration. The samples were placed in saline and submitted to pathology. The needle was removed and hemostasis was achieved with manual compression. Post procedure scan was negative for significant hematoma. A dressing was applied. The patient tolerated the procedure well without immediate postprocedural complication. IMPRESSION: Technically successful ultrasound guided biopsy of dominant left supraclavicular nodal conglomeration lymph node. Electronically Signed   By: Sandi Mariscal M.D.   On: 02/06/2022 10:37   CT CHEST W CONTRAST  Addendum Date: 02/06/2022   ADDENDUM REPORT: 02/06/2022 10:23 ADDENDUM: Images were reviewed again. There is 3 cm left supraclavicular lymph node suggesting metastatic disease. There is a lytic lesion in right glenoid consistent with skeletal metastatic disease. Electronically Signed   By: Elmer Picker M.D.   On: 02/06/2022 10:23   Addendum Date: 02/05/2022   ADDENDUM REPORT: 02/05/2022 16:15 ADDENDUM: Images were reviewed with additional clinical history. In the previous CT chest done on 10/01/2020, there is 1.5 cm pleural-based noncalcified nodule in the medial left upper lobe. In view of this prior finding, imaging findings in the left lung in the current study most likely suggest extensive malignant  neoplastic process with lymphangitic spread of metastatic disease throughout left lung. Nodular densities in the right lung suggest metastatic disease. Enlarged lymph nodes in mediastinum suggest metastatic lymphadenopathy. Follow-up PET-CT and tissue sampling should be considered. Electronically Signed   By: Elmer Picker M.D.   On: 02/05/2022 16:15   Result Date: 02/06/2022 CLINICAL DATA:  Pneumonia EXAM: CT CHEST WITH CONTRAST TECHNIQUE: Multidetector CT imaging of the chest was performed during intravenous contrast administration. RADIATION DOSE REDUCTION: This exam was performed according to the departmental dose-optimization program which includes automated exposure control, adjustment of the mA and/or kV according to patient size and/or use of iterative reconstruction technique. CONTRAST:  69m OMNIPAQUE IOHEXOL 350 MG/ML SOLN COMPARISON:  Previous CT done on 10/01/2020 and chest radiographs done today FINDINGS: Cardiovascular: There is homogeneous enhancement in thoracic aorta. There are no intraluminal filling defects in central pulmonary artery branches. Mediastinum/Nodes: There are enlarged lymph nodes in mediastinum and hilar regions. There is a precarinal node measuring 1.3 cm in short axis. There is subcarinal node measuring 1.4 cm in short axis. There is 2 cm node in the inferior right hilum. Evaluation of left hilum for lymphadenopathy is limited by dense adjacent infiltrates. Lungs/Pleura: There is large dense infiltrate in left upper lobe. There is 7 cm slightly inhomogeneous attenuation in the left apex. There is 5.5 cm density in the anteromedial left upper lobe.  There is a 3.9 cm opacity in the left parahilar region and left lower lobe. There are other extensive patchy infiltrates in left upper lobe and left lower lobe. There is interval clearing of ground-glass densities in right lung. In image 116 of series 4, there is a new 1 cm nodule with spiculated margins in right lower lobe. In [place]  one hundred 21, there is 7 mm pleural-based density in posterior right lower lung field. In image 139, there is 6 mm nodular density in the posterior right lower lobe. Small left pleural effusion is seen. There is no pneumothorax. Blebs and bullae are seen in the right apex. Upper Abdomen: There is fatty infiltration of the liver. Musculoskeletal: No focal lytic or sclerotic lesions are seen. IMPRESSION: There is interval appearance of large infiltrate in left upper lobe suggesting pneumonia. There are areas of homogeneous opacity without aeration in left upper lobe. Possibility of underlying malignant neoplastic processes not excluded. Extensive patchy infiltrates are seen in left lower lobe including 3.9 cm homogeneous opacity in the inferior left hilum. Small left pleural effusion is seen. There are enlarged lymph nodes in mediastinum and hilar regions suggesting infectious or neoplastic process. There are few nodular densities in right lower lobe largest measuring 10 mm in size with spiculated margins. This finding may suggest small foci of pneumonia or neoplastic process. Short-term follow-up CT after antibiotic treatment along with PET-CT if warranted should be considered. Electronically Signed: By: Elmer Picker M.D. On: 02/05/2022 14:29   MR SHOULDER RIGHT WO CONTRAST  Result Date: 02/05/2022 CLINICAL DATA:  Right shoulder pain. Destructive lesion involving the glenoid noted on recent CT scan. EXAM: MRI OF THE RIGHT SHOULDER WITHOUT CONTRAST TECHNIQUE: Multiplanar, multisequence MR imaging of the shoulder was performed. No intravenous contrast was administered. COMPARISON:  Chest CT 02/05/2022 FINDINGS: Rotator cuff: The rotator cuff tendons are intact. Mild tendinopathy but no full-thickness retracted tear. Muscles:  No significant findings. Biceps long head:  Intact Acromioclavicular Joint: Mild degenerative changes. Type 2-3 acromion. Significant anterior downsloping of the acromion. No  lateral downsloping or subacromial spurring. Glenohumeral Joint: There is a very aggressive destructive bone lesions involving the scapula, most notably in the glenoid area but also involving the coracoid process and part of the scapular body. The bone is largely destroyed and there is a large associated soft tissue mass measuring approximately 5.0 x 4.5 cm. Osteomyelitis is a consideration but I would expect to see severe surrounding myofasciitis and cellulitis which really is not present. Also, I would expect there would be septic arthritis and involvement of the humeral head which there is not. Biopsy is recommended. Labrum:  Degenerated and likely torn. Bones: Destructive scapular bone lesion as detailed above. No other bony structures are in. Other: No subacromial/subdeltoid fluid collections to suggest bursitis. Enlarged right axillary lymph node is noted. IMPRESSION: 1. Destructive scapular bone lesion with associated large soft tissue mass measuring approximately 5.0 x 4.5 cm. Although osteomyelitis is a consideration I think it is unlikely and more likely a neoplastic process. 2. Biopsy is recommended. 3. Intact rotator cuff tendons and long head biceps tendon. Electronically Signed   By: Marijo Sanes M.D.   On: 02/05/2022 20:09   DG Shoulder Right Port  Addendum Date: 02/05/2022   ADDENDUM REPORT: 02/05/2022 16:27 ADDENDUM: The original report was by Dr. Van Clines. The following addendum is by Dr. Van Clines: These results were called by telephone at the time of interpretation on 02/05/2022 at 4:24 pm to provider RONDELL  SMITH , who verbally acknowledged these results. Electronically Signed   By: Van Clines M.D.   On: 02/05/2022 16:27   Result Date: 02/05/2022 CLINICAL DATA:  Coughing.  Right shoulder pain. EXAM: RIGHT SHOULDER - 2 VIEWS COMPARISON:  Chest radiograph 02/05/2022 FINDINGS: Permeative lytic process in the right glenoid. The proximal humerus and distal clavicle appear  intact. IMPRESSION: 1. Permeative lytic lesion in the right glenoid favoring osteomyelitis given the concomitant extensive left pneumonia. Septic right glenohumeral joint not excluded. Radiology assistant personnel have been notified to put me in telephone contact with the referring physician or the referring physician's clinical representative in order to discuss these findings. Once this communication is established I will issue an addendum to this report for documentation purposes. Electronically Signed: By: Van Clines M.D. On: 02/05/2022 16:21   DG Chest 2 View  Result Date: 02/05/2022 CLINICAL DATA:  Chronic for 1 month.  Right shoulder pain. EXAM: CHEST - 2 VIEW COMPARISON:  Chest two views 09/30/2020, CT chest 10/01/2020 FINDINGS: There is mild high-grade heterogeneous airspace opacification throughout the mid and superior aspects of the left lung with diffuse left lung interstitial thickening also extending into the inferior left lung. More homogeneous opacification of the left lung apex, possible pleural fluid versus airspace opacity. The right lung is clear. No inferior layering pleural effusion is seen. No pneumothorax. The cardiac silhouette is partially obscured but does not appear enlarged. Mediastinal contours are not well evaluated. No acute skeletal abnormality. IMPRESSION: Heterogeneous airspace opacification of the mid to upper left lung with diffuse left lung interstitial thickening. Findings are suspicious for high-grade pneumonia. Note is made on prior radiographs and CT there was clinical concern for ground-glass, multifocal atypical pneumonia. Is this patient immunocompromised? Electronically Signed   By: Yvonne Kendall M.D.   On: 02/05/2022 09:50     ASSESSMENT/PLAN:  This is a very pleasant 61 year old African-American male diagnosed with stage IVb (T3, N3, M1 C) non-small cell lung cancer, adenocarcinoma. He presented with large left upper lobe lung mass in addition to  lymphangitic carcinomatosis, widespread nodal disease in the chest and abdomen, bony involvement in the right scapula and proximal femur with muscular involvement and cutaneous involvement, and metastatic disease to the brain. There is a spiculated nodule in the right chest without increased metabolic activity which could represent indolent synchronous primary. Diagnosed in September 2023.   His molecular studies show PD-L1 expression of 93%.  His molecular studies showed MET amplification which can be targeted in the second line setting.  The patient also has a BRCA2 mutation.  The patient recently had a staging PET scan and brain MRI.  Dr. Julien Nordmann personally independently reviewed the results with the patient today.    Dr. Julien Nordmann had a lengthy discussion with the patient today about his current condition and recommended treatment options.  Dr. Julien Nordmann gave the patient the option of treatment with palliative systemic chemotherapy and immunotherapy versus single agent immunotherapy given his PD-L1 expression of 93%.  The patient is interested in immunotherapy alone.  Therefore, Dr. Julien Nordmann recommends that the patient undergo treatment with Libtayo IV every 3 weeks.  He is expected to undergo his first cycle of treatment next week on 03/13/2022.   I discussed with him the adverse effect of the immunotherapy including but not limited to immunotherapy mediated skin rash, diarrhea, inflammation of the lung, kidney, liver, thyroid or other endocrine dysfunction  The patient's last day of palliative radiation to the scapula is next week.  I reach  out to Dr. Sondra Come regarding the brain MRI results to consider SRS to the metastatic brain lesions.   I will arrange for chemo education class prior to receiving his first cycle of treatment.  I have sent a prescription for Compazine 10 mg p.o. every 6 hours to the patient's pharmacy for nausea and vomiting.  I also sent him a prescription of Remeron 7.5 mg p.o.  nightly to help with his appetite.  I referred him to a member the nutritionist team.  I encouraged him to continue drinking ensures.  I sent his prescriptions to the [facility] as he has some financial concerns.  I also referred him to a Education officer, museum.  The did not have any appreciable improvement in his cough with Tessalon.  He is going to try Robitussin over-the-counter.  We will see him back for follow-up visit in 4 weeks for evaluation repeat blood work before starting cycle #2 treatment. He is scheduled to complete palliative radiation to the shoulder on 03/12/2022.  SRS?  He will continue to take oxycodone every 8 hours for pain management.  He is followed closely by palliative care.  For the patient's BRCA2 mutation, the patient has 2 children.  He has a brother who had pancreatic cancer.  The patient would be interested in referral to a genetic counselor regarding this.  The patient was advised to call immediately if he has any concerning symptoms in the interval. The patient voices understanding of current disease status and treatment options and is in agreement with the current care plan. All questions were answered. The patient knows to call the clinic with any problems, questions or concerns. We can certainly see the patient much sooner if necessary   Orders Placed This Encounter  Procedures   CBC with Differential ([place] Only)    Standing Status:   Future    Standing Expiration Date:   03/14/2023   CMP ([place] only)    Standing Status:   Future    Standing Expiration Date:   03/14/2023   T4    Standing Status:   Future    Standing Expiration Date:   03/14/2023   TSH    Standing Status:   Future    Standing Expiration Date:   03/14/2023   CBC with Differential ([place] Only)    Standing Status:   Future    Standing Expiration Date:   04/04/2023   CMP ([place] only)    Standing Status:   Future    Standing Expiration Date:   04/04/2023    CBC with Differential ([place] Only)    Standing Status:   Future    Standing Expiration Date:   04/25/2023   CMP ([place] only)    Standing Status:   Future    Standing Expiration Date:   04/25/2023   T4    Standing Status:   Future    Standing Expiration Date:   04/25/2023   TSH    Standing Status:   Future    Standing Expiration Date:   04/25/2023   CBC with Differential ([place] Only)    Standing Status:   Future    Standing Expiration Date:   05/16/2023   CMP ([place] only)    Standing Status:   Future    Standing Expiration Date:   05/16/2023   Ambulatory referral to Social Work    Referral Priority:   Routine    Referral Type:   Consultation  Referral Reason:   Specialty Services Required    Number of Visits Requested:   1   Ambulatory Referral to [facility] Nutrition    Referral Priority:   Routine    Referral Type:   Consultation    Referral Reason:   Specialty Services Required    Number of Visits Requested:   [place], PA-C 03/06/22  ADDENDUM: Hematology/Oncology Attending: I had a face-to-face encounter with the patient today.  I reviewed his record, lab, scans and recommended his care plan.  This is a very pleasant 61 years old African-American male with HIV and recently diagnosed with stage IVb (T3, N3, M1 C) non-small cell lung cancer, adenocarcinoma presented with large left upper lobe lung mass in addition to lymphangitic spread and nodal disease in the chest, abdomen and bone metastasis involving the right scapula and proximal femur with muscular involvement and cutaneous involvement and metastatic disease to the brain diagnosed in September 2023.  The patient had molecular studies by foundation 1 that showed no actionable mutations except for MET amplification and BRCA2 mutation.  The patient had PD-L1 expression of 93%. He underwent palliative radiotherapy to the metastatic bone lesions under the care of Dr.  Sondra Come and expected to start brain radiation soon. I had a lengthy discussion with the patient today about his current disease stage, prognosis and treatment options. I discussed with the patient his treatment options including palliative care and hospice referral versus consideration of palliative treatment with either combination of chemotherapy with carboplatin, Alimta and Keytruda versus treatment with single agent immunotherapy with Libtayo (Cempilimab) which has been approved as a monotherapy for patient with PD-L1 expression over 50% and it allowed HIV patient to be included in the clinical trial. The patient had MET amplification and this could be used on an option for treatment on the second line setting if the patient has evidence for disease progression after the first-line treatment. The patient is interested in treatment with immunotherapy as single agent at this point and not interested in a combination with chemotherapy. I will start the patient on treatment with Libtayo (Cempilimab) 350 Mg IV every 3 weeks next week. I discussed with the patient the adverse effect of this treatment including but not limited to immunotherapy mediated skin rash, diarrhea, inflammation of the lung, kidney, liver, thyroid or other endocrine dysfunction during type 1 diabetes mellitus. The patient will have a chemotherapy education class before the first dose of his treatment. He will come back for follow-up visit in 4 weeks for evaluation with the start of cycle #2. The patient was advised to call immediately if he has any other concerning symptoms in the interval. The total time spent in the appointment was 40 minutes. Disclaimer: This note was dictated with voice recognition software. Similar sounding words can inadvertently be transcribed and may be missed upon review. Eilleen Kempf, MD

## 2022-03-05 ENCOUNTER — Other Ambulatory Visit: Payer: Self-pay

## 2022-03-05 ENCOUNTER — Ambulatory Visit
Admission: RE | Admit: 2022-03-05 | Discharge: 2022-03-05 | Disposition: A | Discharge status: Home / Self Care | Payer: Commercial Managed Care - HMO | Source: Ambulatory Visit | Attending: Radiation Oncology | Admitting: Radiation Oncology

## 2022-03-05 ENCOUNTER — Inpatient Hospital Stay: Payer: Commercial Managed Care - HMO

## 2022-03-05 DIAGNOSIS — C3412 Malignant neoplasm of upper lobe, left bronchus or lung: Secondary | ICD-10-CM | POA: Diagnosis not present

## 2022-03-05 LAB — RAD ONC ARIA SESSION SUMMARY
Course Elapsed Days: 6
Plan Fractions Treated to Date: 5
Plan Prescribed Dose Per Fraction: 3 Gy
Plan Total Fractions Prescribed: 10
Plan Total Prescribed Dose: 30 Gy
Reference Point Dosage Given to Date: 15 Gy
Reference Point Session Dosage Given: 3 Gy
Session Number: 5

## 2022-03-06 ENCOUNTER — Ambulatory Visit
Admission: RE | Admit: 2022-03-06 | Discharge: 2022-03-06 | Disposition: A | Discharge status: Home / Self Care | Payer: Commercial Managed Care - HMO | Source: Ambulatory Visit | Attending: Radiation Oncology | Admitting: Radiation Oncology

## 2022-03-06 ENCOUNTER — Inpatient Hospital Stay: Payer: Commercial Managed Care - HMO

## 2022-03-06 ENCOUNTER — Other Ambulatory Visit (HOSPITAL_COMMUNITY): Payer: Self-pay

## 2022-03-06 ENCOUNTER — Other Ambulatory Visit: Payer: Self-pay

## 2022-03-06 ENCOUNTER — Encounter: Payer: Self-pay | Admitting: Physician Assistant

## 2022-03-06 ENCOUNTER — Inpatient Hospital Stay (HOSPITAL_BASED_OUTPATIENT_CLINIC_OR_DEPARTMENT_OTHER): Payer: Commercial Managed Care - HMO | Admitting: Physician Assistant

## 2022-03-06 VITALS — BP 109/69 | HR 90 | Temp 97.9°F | Resp 17 | Wt 121.0 lb

## 2022-03-06 DIAGNOSIS — Z1501 Genetic susceptibility to malignant neoplasm of breast: Secondary | ICD-10-CM | POA: Diagnosis not present

## 2022-03-06 DIAGNOSIS — C3490 Malignant neoplasm of unspecified part of unspecified bronchus or lung: Secondary | ICD-10-CM

## 2022-03-06 DIAGNOSIS — C3412 Malignant neoplasm of upper lobe, left bronchus or lung: Secondary | ICD-10-CM | POA: Diagnosis not present

## 2022-03-06 DIAGNOSIS — Z1509 Genetic susceptibility to other malignant neoplasm: Secondary | ICD-10-CM

## 2022-03-06 DIAGNOSIS — R634 Abnormal weight loss: Secondary | ICD-10-CM | POA: Diagnosis not present

## 2022-03-06 DIAGNOSIS — Z7189 Other specified counseling: Secondary | ICD-10-CM | POA: Insufficient documentation

## 2022-03-06 DIAGNOSIS — C7931 Secondary malignant neoplasm of brain: Secondary | ICD-10-CM

## 2022-03-06 LAB — CBC WITH DIFFERENTIAL (CANCER CENTER ONLY)
Abs Immature Granulocytes: 0.07 10*3/uL (ref 0.00–0.07)
Basophils Absolute: 0 10*3/uL (ref 0.0–0.1)
Basophils Relative: 0 %
Eosinophils Absolute: 0.4 10*3/uL (ref 0.0–0.5)
Eosinophils Relative: 4 %
HCT: 36.2 % — ABNORMAL LOW (ref 39.0–52.0)
Hemoglobin: 11.4 g/dL — ABNORMAL LOW (ref 13.0–17.0)
Immature Granulocytes: 1 %
Lymphocytes Relative: 8 %
Lymphs Abs: 0.9 10*3/uL (ref 0.7–4.0)
MCH: 24.8 pg — ABNORMAL LOW (ref 26.0–34.0)
MCHC: 31.5 g/dL (ref 30.0–36.0)
MCV: 78.7 fL — ABNORMAL LOW (ref 80.0–100.0)
Monocytes Absolute: 1.1 10*3/uL — ABNORMAL HIGH (ref 0.1–1.0)
Monocytes Relative: 10 %
Neutro Abs: 8.4 10*3/uL — ABNORMAL HIGH (ref 1.7–7.7)
Neutrophils Relative %: 77 %
Platelet Count: 596 10*3/uL — ABNORMAL HIGH (ref 150–400)
RBC: 4.6 MIL/uL (ref 4.22–5.81)
RDW: 14.6 % (ref 11.5–15.5)
WBC Count: 10.9 10*3/uL — ABNORMAL HIGH (ref 4.0–10.5)
nRBC: 0 % (ref 0.0–0.2)

## 2022-03-06 LAB — RAD ONC ARIA SESSION SUMMARY
Course Elapsed Days: 7
Plan Fractions Treated to Date: 6
Plan Prescribed Dose Per Fraction: 3 Gy
Plan Total Fractions Prescribed: 10
Plan Total Prescribed Dose: 30 Gy
Reference Point Dosage Given to Date: 18 Gy
Reference Point Session Dosage Given: 3 Gy
Session Number: 6

## 2022-03-06 LAB — CMP (CANCER CENTER ONLY)
ALT: 8 U/L (ref 0–44)
AST: 15 U/L (ref 15–41)
Albumin: 2.6 g/dL — ABNORMAL LOW (ref 3.5–5.0)
Alkaline Phosphatase: 66 U/L (ref 38–126)
Anion gap: 4 — ABNORMAL LOW (ref 5–15)
BUN: 13 mg/dL (ref 8–23)
CO2: 30 mmol/L (ref 22–32)
Calcium: 8.3 mg/dL — ABNORMAL LOW (ref 8.9–10.3)
Chloride: 104 mmol/L (ref 98–111)
Creatinine: 0.75 mg/dL (ref 0.61–1.24)
GFR, Estimated: >60 mL/min (ref >60)
Glucose, Bld: 96 mg/dL (ref 70–99)
Potassium: 4.3 mmol/L (ref 3.5–5.1)
Sodium: 138 mmol/L (ref 135–145)
Total Bilirubin: 0.3 mg/dL (ref 0.3–1.2)
Total Protein: 6.7 g/dL (ref 6.5–8.1)

## 2022-03-06 MED ORDER — PROCHLORPERAZINE MALEATE 10 MG PO TABS
10.0000 mg | ORAL_TABLET | Freq: Four times a day (QID) | ORAL | 2 refills | Status: DC | PRN
  Filled 2022-03-06: qty 30, 8d supply, fill #0

## 2022-03-06 MED ORDER — MIRTAZAPINE 7.5 MG PO TABS
7.5000 mg | ORAL_TABLET | Freq: Every day | ORAL | 2 refills | Status: DC
  Filled 2022-03-06: qty 30, 30d supply, fill #0

## 2022-03-06 NOTE — Patient Instructions (Signed)
Summary:  -There are two main categories of lung cancer, they are named based on the size of the cancer cell. One is called Non-Small cell lung cancer. The other type is Small Cell Lung Cancer -The sample (biopsy) that they took of your tumor was consistent with a subtype of Non-small cell lung cancer called Adenocarcinoma. This is the most common type of lung cancer.  -We covered a lot of important information at your appointment today regarding what the treatment plan is moving forward. Here are the the main points that were discussed at your office visit with [place] today:  -The treatment that you will receive consists of one immunotherapy drug called Libtayo -We are planning on starting your treatment next week on 03/13/22 but before your start your treatment, I would like you to attend a Chemotherapy Education Class. This involves having you sit down with one of our nurse educators. She will discuss with your one-on-one more details about your treatment as well as general information about resources here at the cancer center.  -Your treatment will be given once every 3 weeks.  -I discussed with her the adverse effect of the immunotherapy including but not limited to immunotherapy mediated skin rash, diarrhea, inflammation of the lung, kidney, liver, thyroid or other endocrine dysfunction -We will get a CT scan after 3 treatments to check on the progress of treatment  Medications:  -I have sent a few important medication prescriptions to your pharmacy.  -Compazine was sent to your pharmacy. This medication is for nausea. You may take this every 6 hours as needed if you feel nauseous. Nausea is not common with this treatment.  -I have sent remeron to your pharmacy. Please take this before bed   Referrals or Imaging:\ -I will refer you to a social worker and nutritionist   Follow up:  -We will see you back for a follow up visit 4 weeks when you come for your second treatment.   -If you need to  reach [place] at any time, the main office number to the cancer center is [phone], when you call, ask to speak to either Cassie's or Dr. Worthy Flank nurse.

## 2022-03-06 NOTE — Progress Notes (Signed)
START OFF PATHWAY REGIMEN - Non-Small Cell Lung   [id number]:Cemiplimab 350 mg IV D1 q21 Days (Maintenance):   A cycle is every 21 days:     Cemiplimab-rwlc   **Always confirm dose/schedule in your pharmacy ordering system**  Patient Characteristics: Stage IV Metastatic, Nonsquamous, Molecular Analysis Completed, Molecular Alteration Present and Targeted Therapy Exhausted OR EGFR Exon 20+ or KRAS G12C+ or HER2+ Present and No Prior Chemo/Immunotherapy OR No Alteration Present, Initial  Chemotherapy/Immunotherapy, PS = 0, 1, No Alteration Present, No Alteration Present, Candidate for Immunotherapy, PD-L1 Expression Positive  ? 50% (TPS) and Immunotherapy Candidate Therapeutic Status: Stage IV Metastatic Histology: Nonsquamous Cell Broad Molecular Profiling Status: Engineer, manufacturing Analysis Results: No Alteration Present ECOG Performance Status: 1 Chemotherapy/Immunotherapy Line of Therapy: Initial Chemotherapy/Immunotherapy EGFR Exons 18-21 Mutation Testing Status: Completed and Negative ALK Fusion/Rearrangement Testing Status: Completed and Negative BRAF V600 Mutation Testing Status: Completed and Negative KRAS G12C Mutation Testing Status: Completed and Negative MET Exon 14 Mutation Testing Status: Completed and Negative RET Fusion/Rearrangement Testing Status: Completed and Negative HER2 Mutation Testing Status: Completed and Negative NTRK Fusion/Rearrangement Testing Status: Completed and Negative ROS1 Fusion/Rearrangement Testing Status: Completed and Negative Immunotherapy Candidate Status: Candidate for Immunotherapy PD-L1 Expression Status: PD-L1 Positive ? 50% (TPS) Intent of Therapy: Non-Curative / Palliative Intent, Discussed with Patient

## 2022-03-07 ENCOUNTER — Encounter: Payer: Self-pay | Admitting: Internal Medicine

## 2022-03-07 ENCOUNTER — Other Ambulatory Visit: Payer: Self-pay

## 2022-03-07 ENCOUNTER — Other Ambulatory Visit (HOSPITAL_COMMUNITY): Payer: Self-pay

## 2022-03-07 ENCOUNTER — Ambulatory Visit
Admission: RE | Admit: 2022-03-07 | Discharge: 2022-03-07 | Disposition: A | Discharge status: Home / Self Care | Payer: Commercial Managed Care - HMO | Source: Ambulatory Visit | Attending: Radiation Oncology | Admitting: Radiation Oncology

## 2022-03-07 DIAGNOSIS — C3412 Malignant neoplasm of upper lobe, left bronchus or lung: Secondary | ICD-10-CM | POA: Diagnosis not present

## 2022-03-07 LAB — RAD ONC ARIA SESSION SUMMARY
Course Elapsed Days: 8
Plan Fractions Treated to Date: 7
Plan Prescribed Dose Per Fraction: 3 Gy
Plan Total Fractions Prescribed: 10
Plan Total Prescribed Dose: 30 Gy
Reference Point Dosage Given to Date: 21 Gy
Reference Point Session Dosage Given: 3 Gy
Session Number: 7

## 2022-03-10 ENCOUNTER — Ambulatory Visit
Admission: RE | Admit: 2022-03-10 | Discharge: 2022-03-10 | Disposition: A | Discharge status: Home / Self Care | Payer: Commercial Managed Care - HMO | Source: Ambulatory Visit | Attending: Radiation Oncology | Admitting: Radiation Oncology

## 2022-03-10 ENCOUNTER — Encounter: Payer: Self-pay | Admitting: Adult Health

## 2022-03-10 ENCOUNTER — Inpatient Hospital Stay: Payer: Commercial Managed Care - HMO | Admitting: Nutrition

## 2022-03-10 ENCOUNTER — Telehealth: Payer: Self-pay | Admitting: Internal Medicine

## 2022-03-10 ENCOUNTER — Other Ambulatory Visit: Payer: Self-pay

## 2022-03-10 DIAGNOSIS — C3412 Malignant neoplasm of upper lobe, left bronchus or lung: Secondary | ICD-10-CM | POA: Diagnosis not present

## 2022-03-10 LAB — RAD ONC ARIA SESSION SUMMARY
Course Elapsed Days: 11
Plan Fractions Treated to Date: 8
Plan Prescribed Dose Per Fraction: 3 Gy
Plan Total Fractions Prescribed: 10
Plan Total Prescribed Dose: 30 Gy
Reference Point Dosage Given to Date: 24 Gy
Reference Point Session Dosage Given: 3 Gy
Session Number: 8

## 2022-03-10 NOTE — Progress Notes (Signed)
61 year old male diagnosed with stage IVb non-small cell lung cancer with brain metastases in September 2023.  He is followed by Dr. Julien Nordmann.  CURRENT THERAPY: 1) palliative radiation to the painful metastatic scapular lesion under the care of Dr. Sondra Come.  Last day radiation scheduled for 03/12/2022 2) SRS to the metastatic brain lesions under the care of Dr. Sondra Come. TBD 3) Palliative systemic immunotherapy with Libtayo IV every 3 weeks. First dose on 03/13/22.   Past medical history includes hernia and HIV.  Medications include Remeron, oxycodone, Compazine.  Labs include albumin 2.6 on October 5.  Height: 5 feet 10 inches. Weight: 121 pounds October 6. Usual body weight: 158 pounds per patient. BMI: 17.36. ECOG: 1-2.  Patient reports he has a good appetite.  He denies nutrition impact symptoms at this time.  He has been drinking Ensure Plus at mealtimes 3 times daily.  Patient reports his father purchases cases of Ensure Plus for him.  His favorite flavor is strawberry.  Nutrition diagnosis: Unintended weight loss related to metastatic lung cancer as evidenced by 23% weight loss from usual body weight.  Intervention: Educated patient to increase calories and protein in small frequent meals and snacks. Continue Ensure plus or equivalent 3 times daily between meals. Provided coupons and samples for patient. Encouraged bowel regimen. Provided nutrition education fact sheets and contact information.  Questions answered.  Monitoring, evaluation, goals: Patient will tolerate increased calories and protein to promote weight gain.  Next visit: Patient will contact RD for questions or concerns.  Encouraged him to let me know if he needs more coupons.

## 2022-03-10 NOTE — Telephone Encounter (Signed)
Scheduled per 10/05 los, patient has been called and notified of upcoming appointments. Instructed patient to receive printed calender to keep up with appointments as well.

## 2022-03-11 ENCOUNTER — Ambulatory Visit
Admission: RE | Admit: 2022-03-11 | Discharge: 2022-03-11 | Disposition: A | Discharge status: Home / Self Care | Payer: Commercial Managed Care - HMO | Source: Ambulatory Visit | Attending: Radiation Oncology | Admitting: Radiation Oncology

## 2022-03-11 ENCOUNTER — Inpatient Hospital Stay: Payer: Commercial Managed Care - HMO | Admitting: Nurse Practitioner

## 2022-03-11 ENCOUNTER — Other Ambulatory Visit: Payer: Self-pay

## 2022-03-11 DIAGNOSIS — C3412 Malignant neoplasm of upper lobe, left bronchus or lung: Secondary | ICD-10-CM | POA: Diagnosis not present

## 2022-03-11 LAB — RAD ONC ARIA SESSION SUMMARY
Course Elapsed Days: 12
Plan Fractions Treated to Date: 9
Plan Prescribed Dose Per Fraction: 3 Gy
Plan Total Fractions Prescribed: 10
Plan Total Prescribed Dose: 30 Gy
Reference Point Dosage Given to Date: 27 Gy
Reference Point Session Dosage Given: 3 Gy
Session Number: 9

## 2022-03-11 NOTE — Progress Notes (Signed)
This encounter was created in error - please disregard.

## 2022-03-12 ENCOUNTER — Ambulatory Visit
Admission: RE | Admit: 2022-03-12 | Discharge: 2022-03-12 | Disposition: A | Discharge status: Home / Self Care | Payer: Commercial Managed Care - HMO | Source: Ambulatory Visit | Attending: Radiation Oncology | Admitting: Radiation Oncology

## 2022-03-12 ENCOUNTER — Encounter: Payer: Self-pay | Admitting: Nurse Practitioner

## 2022-03-12 ENCOUNTER — Inpatient Hospital Stay: Payer: Commercial Managed Care - HMO

## 2022-03-12 ENCOUNTER — Other Ambulatory Visit: Payer: Self-pay

## 2022-03-12 ENCOUNTER — Inpatient Hospital Stay (HOSPITAL_BASED_OUTPATIENT_CLINIC_OR_DEPARTMENT_OTHER): Payer: Commercial Managed Care - HMO | Admitting: Nurse Practitioner

## 2022-03-12 ENCOUNTER — Inpatient Hospital Stay: Payer: Commercial Managed Care - HMO | Admitting: Licensed Clinical Social Worker

## 2022-03-12 VITALS — BP 112/77 | HR 97 | Temp 97.9°F | Resp 16 | Wt 119.3 lb

## 2022-03-12 DIAGNOSIS — Z515 Encounter for palliative care: Secondary | ICD-10-CM

## 2022-03-12 DIAGNOSIS — C7951 Secondary malignant neoplasm of bone: Secondary | ICD-10-CM

## 2022-03-12 DIAGNOSIS — C3490 Malignant neoplasm of unspecified part of unspecified bronchus or lung: Secondary | ICD-10-CM

## 2022-03-12 DIAGNOSIS — K5903 Drug induced constipation: Secondary | ICD-10-CM

## 2022-03-12 DIAGNOSIS — G893 Neoplasm related pain (acute) (chronic): Secondary | ICD-10-CM | POA: Diagnosis not present

## 2022-03-12 DIAGNOSIS — C3412 Malignant neoplasm of upper lobe, left bronchus or lung: Secondary | ICD-10-CM | POA: Diagnosis not present

## 2022-03-12 DIAGNOSIS — R530 Neoplastic (malignant) related fatigue: Secondary | ICD-10-CM

## 2022-03-12 LAB — RAD ONC ARIA SESSION SUMMARY
Course Elapsed Days: 13
Plan Fractions Treated to Date: 10
Plan Prescribed Dose Per Fraction: 3 Gy
Plan Total Fractions Prescribed: 10
Plan Total Prescribed Dose: 30 Gy
Reference Point Dosage Given to Date: 30 Gy
Reference Point Session Dosage Given: 3 Gy
Session Number: 10

## 2022-03-12 MED ORDER — OXYCODONE HCL 5 MG PO TABS: 5.0000 mg | ORAL_TABLET | Freq: Four times a day (QID) | ORAL | 0 refills | Status: DC | PRN

## 2022-03-12 NOTE — Progress Notes (Signed)
Pt enrolled with cancer center transport

## 2022-03-12 NOTE — Progress Notes (Signed)
[facility]  [phone] [phone]   Name: Steven Johnston Date: 03/12/2022 MRN: [id number]  DOB: 1960-09-04  Patient Care Team: Patient, No Pcp Per as PCP - General (Centre) Berenice Primas, RN - [employer] Personal Nurse Advocate as Case Manager    INTERVAL HISTORY: Steven Johnston is a 61 y.o. male with  medical history including tobacco use, HIV (Biktarvy) initial positive in 2019, now with recent diagnosis of stage IVb poorly differentiated metastatic non-small cell carcinoma with bone and soft tissue involvement. Current plans to undergo palliative radiation and chemotherapy.  Palliative ask to see for symptom management and goals of care.   SOCIAL HISTORY:     reports that he has quit smoking. His smoking use included cigarettes. He smoked an average of .3 packs per day. He has never used smokeless tobacco. He reports current alcohol use. He reports that he does not use drugs.  ADVANCE DIRECTIVES:    CODE STATUS:   PAST MEDICAL HISTORY: Past Medical History:  Diagnosis Date   Hernia, inguinal, right    HIV (human immunodeficiency virus infection) ([place])     ALLERGIES:  has No Known Allergies.  MEDICATIONS:  Current Outpatient Medications  Medication Sig Dispense Refill   albuterol (VENTOLIN HFA) 108 (90 Base) MCG/ACT inhaler Inhale 2 puffs into the lungs every 6 (six) hours as needed for wheezing or shortness of breath. 6.7 g 1   benzonatate (TESSALON) 100 MG capsule Take 2 capsules (200 mg total) by mouth 3 (three) times daily as needed for cough. 30 capsule 0   bictegravir-emtricitabine-tenofovir AF (BIKTARVY) 50-200-25 MG TABS tablet Take 1 tablet by mouth daily. 30 tablet 5   diclofenac Sodium (VOLTAREN) 1 % GEL Apply 2 g topically 4 (four) times daily. 100 g 0   mirtazapine (REMERON) 7.5 MG tablet Take 1 tablet (7.5 mg total) by mouth at bedtime. 30 tablet 2   nicotine (NICODERM CQ - DOSED IN MG/24  HOURS) 14 mg/24hr patch Place 1 patch (14 mg total) onto the skin daily. 28 patch 0   oxyCODONE (ROXICODONE) 5 MG immediate release tablet Take 1 tablet (5 mg total) by mouth every 6 (six) hours as needed. 60 tablet 0   prochlorperazine (COMPAZINE) 10 MG tablet Take 1 tablet (10 mg total) by mouth every 6 (six) hours as needed. 30 tablet 2   No current facility-administered medications for this visit.    VITAL SIGNS: BP 112/77 (BP Location: Left Arm)   Pulse 97   Temp 97.9 F (36.6 C) (Oral)   Resp 16   Wt 119 lb 4.8 oz (54.1 kg)   SpO2 90%   BMI 17.12 kg/m  Filed Weights   03/12/22 1226  Weight: 119 lb 4.8 oz (54.1 kg)    Estimated body mass index is 17.12 kg/m as calculated from the following:   Height as of 02/25/22: 5\' 10"  (1.778 m).   Weight as of this encounter: 119 lb 4.8 oz (54.1 kg).   PERFORMANCE STATUS (ECOG) : 1 - Symptomatic but completely ambulatory   Physical Exam General: NAD, thin  Cardiovascular: regular rate and rhythm Pulmonary: clear ant fields Extremities: no edema, no joint deformities Skin: no rashes, thin, muscle wasting  Neurological: AAO x3, mood appropriate   IMPRESSION: Steven Johnston presents to the clinic for symptom management follow-up.  No acute distress noted.  He is in a wheelchair but able to transfer himself to the recliner.  Alert and able to engage in  discussions.  Neoplasm related pain He reports his pain is improved.  Continues to be in his right shoulder radiate down his arm, across sternal area, and back.  Reports his pain has decreased to a 5-6/10 with medication supports.  We reviewed medications.  He is taking appropriately as prescribed which is evident as patient is 4 days over due for a refill.  States he does not take around-the-clock on some days.  Pain is most intense later in the day and nighttime hours.  Ongoing education regarding opioid use and responsibility by patient to prevent any breach and pain contract.  He  verbalized understanding.  Constipation Denies any concerns with constipation.  Is not taking stool softener daily however has on hand in case he needs it.  Education provided on using stool softener if no bowel movements in more than 48 hours.  As I discussed the importance of continued conversation with family and their medical providers regarding overall plan of care and treatment options, ensuring decisions are within the context of the patients values and GOCs.  PLAN: Oxycodone 5 mg every 6 hours as needed for breakthrough pain Ongoing education regarding pain contract and ability to continue to receive prescription medications. Will obtain urine UDS at next visit. I will plan to see patient back in 3-4 weeks.   Patient expressed understanding and was in agreement with this plan. He also understands that He can call the clinic at any time with any questions, concerns, or complaints.   Any controlled substances utilized were prescribed in the context of palliative care. PDMP has been reviewed.   Time Total: 35 min   Visit consisted of counseling and education dealing with the complex and emotionally intense issues of symptom management and palliative care in the setting of serious and potentially life-threatening illness.Greater than 50%  of this time was spent counseling and coordinating care related to the above assessment and plan.  Alda Lea, AGPCNP-BC  Palliative Medicine Team/[facility]

## 2022-03-12 NOTE — Progress Notes (Signed)
[place] Work  Initial Assessment   Steven Johnston is a 61 y.o. year old male contacted by phone. Clinical Social Work was referred by medical provider for assessment of psychosocial needs.   SDOH (Social Determinants of Health) assessments performed: Yes   SDOH Screenings   Depression (PHQ2-9): Low Risk  (02/11/2022)  Tobacco Use: Medium Risk (03/12/2022)     Distress Screen completed: No     No data to display            Family/Social Information:  Housing Arrangement: patient lives with his 87 year old mother.   Pt's mother is independent in ADLs, but does use a walker for ambulation Family members/support persons in your life? Pt has an adult daughter who resides in [place] and a girlfriend who is local that assists as she is able.  Pt has limited social support. Transportation concerns: yes, pt has been connected to the cancer center  transportation services Employment: Unemployed Pt states it has been a long time since he has worked.  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  Income source: Pt does not have an income at present and is reliant on his mother and girlfriend for financial support.  Pt states he filed for disability in February, but has been unable to reach his lawyer to find out the outcome.  CSW instructed pt to continue to reach out to either his lawyer or DSS to see if he was approved or denied.  If denied CSW can re-submit an application through the [facility] as it is more likely pt will now qualify with the cancer diagnosis. Financial concerns: Yes, current concerns Type of concern: Transportation and Medical bills Food access concerns: no Religious or spiritual practice: Not known Services Currently in place:  None  Coping/ Adjustment to diagnosis: Patient understands treatment plan and what happens next? yes Concerns about diagnosis and/or treatment: How I will pay for the services I need, How will I care for myself, and Quality of  life Patient reported stressors: Transport planner and/or priorities: Pt's priority is to start immunotherapy w/ the hope of improving how he is physically feeling and his overall quality of life. Patient enjoys  not discussed Current coping skills/ strengths: Motivation for treatment/growth  and Supportive family/friends     SUMMARY: Current SDOH Barriers:  Financial constraints related to unemployment and Limited social support  Clinical Social Work Clinical Goal(s):  Explore community resource options for unmet needs related to:  Curator Strain   Interventions: Discussed common feeling and emotions when being diagnosed with cancer, and the importance of support during treatment Informed patient of the support team roles and support services at [facility] Provided [facility] contact information and encouraged patient to call with any questions or concerns Referred patient to [employer] for [employer], provided pt w/ $50 gift card from the [employer].  Instructed pt to follow up on disability application to quickly determine if a new application should be submitted.  Instructed pt to also explore Medicaid as he should be eligible for Medicaid as well as SSI.   Follow Up Plan: Patient will contact CSW with any support or resource needs Patient verbalizes understanding of plan: Yes    Henriette Combs, LCSW

## 2022-03-13 ENCOUNTER — Inpatient Hospital Stay: Payer: Commercial Managed Care - HMO

## 2022-03-13 VITALS — BP 109/70 | HR 100 | Temp 97.8°F | Resp 17

## 2022-03-13 DIAGNOSIS — C3482 Malignant neoplasm of overlapping sites of left bronchus and lung: Secondary | ICD-10-CM | POA: Diagnosis not present

## 2022-03-13 DIAGNOSIS — I214 Non-ST elevation (NSTEMI) myocardial infarction: Secondary | ICD-10-CM | POA: Diagnosis not present

## 2022-03-13 DIAGNOSIS — C3490 Malignant neoplasm of unspecified part of unspecified bronchus or lung: Secondary | ICD-10-CM

## 2022-03-13 LAB — CBC WITH DIFFERENTIAL (CANCER CENTER ONLY)
Abs Immature Granulocytes: 0.07 10*3/uL (ref 0.00–0.07)
Basophils Absolute: 0.1 10*3/uL (ref 0.0–0.1)
Basophils Relative: 1 %
Eosinophils Absolute: 0.5 10*3/uL (ref 0.0–0.5)
Eosinophils Relative: 4 %
HCT: 37.7 % — ABNORMAL LOW (ref 39.0–52.0)
Hemoglobin: 11.7 g/dL — ABNORMAL LOW (ref 13.0–17.0)
Immature Granulocytes: 1 %
Lymphocytes Relative: 10 %
Lymphs Abs: 1.3 10*3/uL (ref 0.7–4.0)
MCH: 24.5 pg — ABNORMAL LOW (ref 26.0–34.0)
MCHC: 31 g/dL (ref 30.0–36.0)
MCV: 78.9 fL — ABNORMAL LOW (ref 80.0–100.0)
Monocytes Absolute: 1.1 10*3/uL — ABNORMAL HIGH (ref 0.1–1.0)
Monocytes Relative: 9 %
Neutro Abs: 9.6 10*3/uL — ABNORMAL HIGH (ref 1.7–7.7)
Neutrophils Relative %: 75 %
Platelet Count: 656 10*3/uL — ABNORMAL HIGH (ref 150–400)
RBC: 4.78 MIL/uL (ref 4.22–5.81)
RDW: 14.7 % (ref 11.5–15.5)
WBC Count: 12.6 10*3/uL — ABNORMAL HIGH (ref 4.0–10.5)
nRBC: 0 % (ref 0.0–0.2)

## 2022-03-13 LAB — CMP (CANCER CENTER ONLY)
ALT: 14 U/L (ref 0–44)
AST: 23 U/L (ref 15–41)
Albumin: 2.6 g/dL — ABNORMAL LOW (ref 3.5–5.0)
Alkaline Phosphatase: 77 U/L (ref 38–126)
Anion gap: 5 (ref 5–15)
BUN: 16 mg/dL (ref 8–23)
CO2: 30 mmol/L (ref 22–32)
Calcium: 8.4 mg/dL — ABNORMAL LOW (ref 8.9–10.3)
Chloride: 106 mmol/L (ref 98–111)
Creatinine: 0.64 mg/dL (ref 0.61–1.24)
GFR, Estimated: >60 mL/min (ref >60)
Glucose, Bld: 100 mg/dL — ABNORMAL HIGH (ref 70–99)
Potassium: 4.2 mmol/L (ref 3.5–5.1)
Sodium: 141 mmol/L (ref 135–145)
Total Bilirubin: 0.3 mg/dL (ref 0.3–1.2)
Total Protein: 7 g/dL (ref 6.5–8.1)

## 2022-03-13 LAB — TSH: TSH: 1.041 u[IU]/mL (ref 0.350–4.500)

## 2022-03-13 MED ORDER — SODIUM CHLORIDE 0.9 % IV SOLN
350.0000 mg | Freq: Once | INTRAVENOUS | Status: AC
  Administered 2022-03-13: 350 mg via INTRAVENOUS
  Filled 2022-03-13: qty 7

## 2022-03-13 MED ORDER — SODIUM CHLORIDE 0.9 % IV SOLN: Freq: Once | INTRAVENOUS | Status: AC

## 2022-03-13 NOTE — Patient Instructions (Addendum)
[facility]  Discharge Instructions: Thank you for choosing [facility] to provide your oncology and hematology care.   If you have a lab appointment with the [facility], please go directly to the [facility] and check in at the registration area.   Wear comfortable clothing and clothing appropriate for easy access to any Portacath or PICC line.   We strive to give you quality time with your provider. You may need to reschedule your appointment if you arrive late (15 or more minutes).  Arriving late affects you and other patients whose appointments are after yours.  Also, if you miss three or more appointments without notifying the office, you may be dismissed from the clinic at the provider's discretion.      For prescription refill requests, have your pharmacy contact our office and allow 72 hours for refills to be completed.    Today you received the following chemotherapy and/or immunotherapy agents: Libtayo      To help prevent nausea and vomiting after your treatment, we encourage you to take your nausea medication as directed.  BELOW ARE SYMPTOMS THAT SHOULD BE REPORTED IMMEDIATELY: *FEVER GREATER THAN 100.4 F (38 C) OR HIGHER *CHILLS OR SWEATING *NAUSEA AND VOMITING THAT IS NOT CONTROLLED WITH YOUR NAUSEA MEDICATION *UNUSUAL SHORTNESS OF BREATH *UNUSUAL BRUISING OR BLEEDING *URINARY PROBLEMS (pain or burning when urinating, or frequent urination) *BOWEL PROBLEMS (unusual diarrhea, constipation, pain near the anus) TENDERNESS IN MOUTH AND THROAT WITH OR WITHOUT PRESENCE OF ULCERS (sore throat, sores in mouth, or a toothache) UNUSUAL RASH, SWELLING OR PAIN  UNUSUAL VAGINAL DISCHARGE OR ITCHING   Items with * indicate a potential emergency and should be followed up as soon as possible or go to the Emergency Department if any problems should occur.  Please show the CHEMOTHERAPY ALERT CARD or IMMUNOTHERAPY ALERT CARD at check-in to  the Emergency Department and triage nurse.  Should you have questions after your visit or need to cancel or reschedule your appointment, please contact [facility]  Dept: [phone]  and follow the prompts.  Office hours are 8:00 a.m. to 4:30 p.m. Monday - Friday. Please note that voicemails left after 4:00 p.m. may not be returned until the following business day.  We are closed weekends and major holidays. You have access to a nurse at all times for urgent questions. Please call the main number to the clinic Dept: [phone] and follow the prompts.   For any non-urgent questions, you may also contact your provider using MyChart. We now offer e-Visits for anyone 1 and older to request care online for non-urgent symptoms. For details visit [email or web address].   Also download the MyChart app! Go to the app store, search "MyChart", open the app, select [facility], and log in with your MyChart username and password.  Masks are optional in the cancer centers. If you would like for your care team to wear a mask while they are taking care of you, please let them know. You may have one support person who is at least 61 years old accompany you for your appointments. Cemiplimab Injection What is this medication? CEMIPLIMAB (se MIP li mab) treats skin cancer and lung cancer. It works by helping your immune system slow or stop the spread of cancer cells. It is a monoclonal antibody. This medicine may be used for other purposes; ask your health care provider or pharmacist if you have questions. COMMON BRAND NAME(S): LIBTAYO What should  I tell my care team before I take this medication? They need to know if you have any of these conditions: Allogeneic stem cell transplant (uses someone else's stem cells) Autoimmune diseases, such as Crohn's disease, ulcerative colitis, or lupus Diabetes Nervous system problems, such as myasthenia gravis or Guillain-Barre  syndrome Organ transplant Recent or ongoing radiation Thyroid disease An unusual or allergic reaction to cemiplimab, other medications, foods, dyes, or preservatives Pregnant or trying to get pregnant Breast-feeding How should I use this medication? This medication is infused into a vein. It is given by your care team in a hospital or clinic setting. A special MedGuide will be given to you before each treatment. Be sure to read this information carefully each time. Talk to your care team about the use of this medication in children. Special care may be needed. Overdosage: If you think you have taken too much of this medicine contact a poison control center or emergency room at once. NOTE: This medicine is only for you. Do not share this medicine with others. What if I miss a dose? Keep appointments for follow-up doses. It is important not to miss your dose. Call your care team if you are unable to keep an appointment. What may interact with this medication? Interactions have not been studied. This list may not describe all possible interactions. Give your health care provider a list of all the medicines, herbs, non-prescription drugs, or dietary supplements you use. Also tell them if you smoke, drink alcohol, or use illegal drugs. Some items may interact with your medicine. What should I watch for while using this medication? This medication may make you feel generally unwell. This is not uncommon as chemotherapy can affect healthy cells as well as cancer cells. Report any side effects. Continue your course of treatment even though you feel ill until your care team tells you to stop. You may need blood work done while you are taking this medication. This medication may cause serious skin reactions. They can happen in the weeks to months after starting the medication. Contact your care team right away if you notice fevers or flu-like symptoms with a rash. The rash may be red or purple and then  turn into blisters or peeling of the skin. You may also notice a red rash with swelling of the face, lips, or lymph nodes in your neck or under your arms. Tell your care team right away if you have any changes in your vision. This medication may increase blood sugar. The risk may be higher in patients who already have diabetes. Ask your care team what you can do to lower your risk of diabetes while taking this medication. Talk to your care team if you wish to become pregnant or think you might be pregnant. This medication can cause serious birth defects if taken during pregnancy. A negative pregnancy test is required before starting this medication. A reliable form of contraception is recommended while taking this medication and for at least 4 months after stopping it. Do not breast-feed while taking this medication and for at least 4 months after stopping it. What side effects may I notice from receiving this medication? Side effects that you should report to your care team as soon as possible: Allergic reactions--skin rash, itching, hives, swelling of the face, lips, tongue, or throat Dry cough, shortness of breath or trouble breathing Eye pain, redness, irritation, or discharge with blurry or decreased vision Heart muscle inflammation--unusual weakness or fatigue, shortness of breath, chest pain,  fast or irregular heartbeat, dizziness, swelling of the ankles, feet, or hands Hormone gland problems--headache, sensitivity to light, unusual weakness or fatigue, dizziness, fast or irregular heartbeat, increased sensitivity to cold or heat, excessive sweating, constipation, hair loss, increased thirst or amount of urine, tremors or shaking, irritability Infusion reactions--chest pain, shortness of breath or trouble breathing, feeling faint or lightheaded Kidney injury (glomerulonephritis)--decrease in the amount of urine, red or dark brown urine, foamy or bubbly urine, swelling of the ankles, hands, or  feet Liver injury--right upper belly pain, loss of appetite, nausea, light-colored stool, dark yellow or brown urine, yellowing skin or eyes, unusual weakness or fatigue Pain, tingling, or numbness in the hands or feet, muscle weakness, change in vision, confusion or trouble speaking, loss of balance or coordination, trouble walking, seizures Rash, fever, and swollen lymph nodes Redness, blistering, peeling, or loosening of the skin, including inside the mouth Sudden or severe stomach pain, bloody diarrhea, fever, nausea, vomiting Side effects that usually do not require medical attention (report these to your care team if they continue or are bothersome): Bone, joint, or muscle pain Diarrhea Fatigue Loss of appetite Nausea Skin rash This list may not describe all possible side effects. Call your doctor for medical advice about side effects. You may report side effects to FDA at 1-800-FDA-1088. Where should I keep my medication? This medication is given in a hospital or clinic and will not be stored at home. NOTE: This sheet is a summary. It may not cover all possible information. If you have questions about this medicine, talk to your doctor, pharmacist, or health care provider.  2023 Elsevier/Gold Standard (2021-04-24 00:00:00)

## 2022-03-14 ENCOUNTER — Telehealth: Payer: Self-pay | Admitting: *Deleted

## 2022-03-14 LAB — T4: T4, Total: 10.7 ug/dL (ref 4.5–12.0)

## 2022-03-14 NOTE — Telephone Encounter (Signed)
Called contact # listed & received vm recording with Ester's name.  Left message for pt or Ester to call back to let us know how he is doing.

## 2022-03-14 NOTE — Telephone Encounter (Signed)
-----   Message from Charleston Poot, RN sent at 03/13/2022  2:03 PM EDT ----- Regarding: First time/ libtayo/ Dr Julien Nordmann pt Hello,   Pt had first time libtayo today. Tolerated treatment well.   Thanks!

## 2022-03-14 NOTE — Addendum Note (Signed)
Addended by: Jimmy Footman on: 03/14/2022 11:01 AM   Modules accepted: Orders

## 2022-03-15 ENCOUNTER — Encounter (HOSPITAL_COMMUNITY): Payer: Self-pay | Admitting: Emergency Medicine

## 2022-03-15 ENCOUNTER — Emergency Department (HOSPITAL_COMMUNITY): Payer: Commercial Managed Care - HMO

## 2022-03-15 ENCOUNTER — Observation Stay (HOSPITAL_COMMUNITY): Payer: Commercial Managed Care - HMO

## 2022-03-15 ENCOUNTER — Other Ambulatory Visit: Payer: Self-pay

## 2022-03-15 ENCOUNTER — Inpatient Hospital Stay (HOSPITAL_COMMUNITY)
Admission: EM | Admit: 2022-03-15 | Discharge: 2022-03-28 | DRG: 180 | Disposition: A | Discharge status: Hospice | Payer: Commercial Managed Care - HMO | Attending: Family Medicine | Admitting: Family Medicine

## 2022-03-15 DIAGNOSIS — Z87891 Personal history of nicotine dependence: Secondary | ICD-10-CM

## 2022-03-15 DIAGNOSIS — I2119 ST elevation (STEMI) myocardial infarction involving other coronary artery of inferior wall: Secondary | ICD-10-CM | POA: Diagnosis present

## 2022-03-15 DIAGNOSIS — B1910 Unspecified viral hepatitis B without hepatic coma: Secondary | ICD-10-CM | POA: Diagnosis present

## 2022-03-15 DIAGNOSIS — Z515 Encounter for palliative care: Secondary | ICD-10-CM

## 2022-03-15 DIAGNOSIS — B20 Human immunodeficiency virus [HIV] disease: Secondary | ICD-10-CM

## 2022-03-15 DIAGNOSIS — C3490 Malignant neoplasm of unspecified part of unspecified bronchus or lung: Secondary | ICD-10-CM

## 2022-03-15 DIAGNOSIS — D75839 Thrombocytosis, unspecified: Secondary | ICD-10-CM

## 2022-03-15 DIAGNOSIS — Z681 Body mass index (BMI) 19 or less, adult: Secondary | ICD-10-CM

## 2022-03-15 DIAGNOSIS — Z66 Do not resuscitate: Secondary | ICD-10-CM | POA: Diagnosis present

## 2022-03-15 DIAGNOSIS — Z23 Encounter for immunization: Secondary | ICD-10-CM

## 2022-03-15 DIAGNOSIS — C3482 Malignant neoplasm of overlapping sites of left bronchus and lung: Secondary | ICD-10-CM | POA: Diagnosis not present

## 2022-03-15 DIAGNOSIS — D72829 Elevated white blood cell count, unspecified: Secondary | ICD-10-CM | POA: Diagnosis not present

## 2022-03-15 DIAGNOSIS — C7951 Secondary malignant neoplasm of bone: Secondary | ICD-10-CM | POA: Diagnosis present

## 2022-03-15 DIAGNOSIS — R591 Generalized enlarged lymph nodes: Secondary | ICD-10-CM | POA: Diagnosis present

## 2022-03-15 DIAGNOSIS — C771 Secondary and unspecified malignant neoplasm of intrathoracic lymph nodes: Secondary | ICD-10-CM | POA: Diagnosis present

## 2022-03-15 DIAGNOSIS — M6259 Muscle wasting and atrophy, not elsewhere classified, multiple sites: Secondary | ICD-10-CM | POA: Diagnosis present

## 2022-03-15 DIAGNOSIS — I2584 Coronary atherosclerosis due to calcified coronary lesion: Secondary | ICD-10-CM | POA: Diagnosis present

## 2022-03-15 DIAGNOSIS — Z923 Personal history of irradiation: Secondary | ICD-10-CM

## 2022-03-15 DIAGNOSIS — J9601 Acute respiratory failure with hypoxia: Secondary | ICD-10-CM | POA: Diagnosis not present

## 2022-03-15 DIAGNOSIS — R000 Tachycardia, unspecified: Secondary | ICD-10-CM | POA: Diagnosis present

## 2022-03-15 DIAGNOSIS — R072 Precordial pain: Secondary | ICD-10-CM

## 2022-03-15 DIAGNOSIS — I959 Hypotension, unspecified: Secondary | ICD-10-CM | POA: Diagnosis not present

## 2022-03-15 DIAGNOSIS — E43 Unspecified severe protein-calorie malnutrition: Secondary | ICD-10-CM | POA: Diagnosis present

## 2022-03-15 DIAGNOSIS — C772 Secondary and unspecified malignant neoplasm of intra-abdominal lymph nodes: Secondary | ICD-10-CM | POA: Diagnosis present

## 2022-03-15 DIAGNOSIS — G893 Neoplasm related pain (acute) (chronic): Secondary | ICD-10-CM | POA: Diagnosis present

## 2022-03-15 DIAGNOSIS — C7801 Secondary malignant neoplasm of right lung: Secondary | ICD-10-CM | POA: Diagnosis present

## 2022-03-15 DIAGNOSIS — Z800 Family history of malignant neoplasm of digestive organs: Secondary | ICD-10-CM

## 2022-03-15 DIAGNOSIS — Z79899 Other long term (current) drug therapy: Secondary | ICD-10-CM

## 2022-03-15 DIAGNOSIS — I700 Atherosclerosis of aorta: Secondary | ICD-10-CM | POA: Diagnosis present

## 2022-03-15 DIAGNOSIS — F1490 Cocaine use, unspecified, uncomplicated: Secondary | ICD-10-CM | POA: Diagnosis present

## 2022-03-15 DIAGNOSIS — Z87898 Personal history of other specified conditions: Secondary | ICD-10-CM

## 2022-03-15 DIAGNOSIS — R54 Age-related physical debility: Secondary | ICD-10-CM | POA: Diagnosis present

## 2022-03-15 DIAGNOSIS — R64 Cachexia: Secondary | ICD-10-CM | POA: Diagnosis present

## 2022-03-15 DIAGNOSIS — J811 Chronic pulmonary edema: Secondary | ICD-10-CM | POA: Diagnosis present

## 2022-03-15 DIAGNOSIS — Z602 Problems related to living alone: Secondary | ICD-10-CM | POA: Diagnosis present

## 2022-03-15 DIAGNOSIS — Z634 Disappearance and death of family member: Secondary | ICD-10-CM

## 2022-03-15 DIAGNOSIS — R0602 Shortness of breath: Secondary | ICD-10-CM

## 2022-03-15 DIAGNOSIS — Z8701 Personal history of pneumonia (recurrent): Secondary | ICD-10-CM

## 2022-03-15 DIAGNOSIS — I25111 Atherosclerotic heart disease of native coronary artery with angina pectoris with documented spasm: Secondary | ICD-10-CM | POA: Diagnosis present

## 2022-03-15 DIAGNOSIS — I214 Non-ST elevation (NSTEMI) myocardial infarction: Secondary | ICD-10-CM | POA: Diagnosis not present

## 2022-03-15 DIAGNOSIS — I10 Essential (primary) hypertension: Secondary | ICD-10-CM | POA: Diagnosis present

## 2022-03-15 DIAGNOSIS — C7931 Secondary malignant neoplasm of brain: Secondary | ICD-10-CM | POA: Diagnosis present

## 2022-03-15 DIAGNOSIS — D509 Iron deficiency anemia, unspecified: Secondary | ICD-10-CM

## 2022-03-15 DIAGNOSIS — R7989 Other specified abnormal findings of blood chemistry: Secondary | ICD-10-CM

## 2022-03-15 DIAGNOSIS — L89152 Pressure ulcer of sacral region, stage 2: Secondary | ICD-10-CM | POA: Diagnosis present

## 2022-03-15 DIAGNOSIS — C799 Secondary malignant neoplasm of unspecified site: Secondary | ICD-10-CM

## 2022-03-15 DIAGNOSIS — I2510 Atherosclerotic heart disease of native coronary artery without angina pectoris: Secondary | ICD-10-CM

## 2022-03-15 DIAGNOSIS — Z597 Insufficient social insurance and welfare support: Secondary | ICD-10-CM

## 2022-03-15 DIAGNOSIS — C3492 Malignant neoplasm of unspecified part of left bronchus or lung: Secondary | ICD-10-CM

## 2022-03-15 DIAGNOSIS — I3139 Other pericardial effusion (noninflammatory): Secondary | ICD-10-CM | POA: Diagnosis present

## 2022-03-15 DIAGNOSIS — J910 Malignant pleural effusion: Secondary | ICD-10-CM | POA: Diagnosis present

## 2022-03-15 DIAGNOSIS — Z7189 Other specified counseling: Secondary | ICD-10-CM

## 2022-03-15 DIAGNOSIS — L8990 Pressure ulcer of unspecified site, unspecified stage: Secondary | ICD-10-CM | POA: Insufficient documentation

## 2022-03-15 LAB — BASIC METABOLIC PANEL
Anion gap: 10 (ref 5–15)
BUN: 13 mg/dL (ref 8–23)
CO2: 26 mmol/L (ref 22–32)
Calcium: 8.2 mg/dL — ABNORMAL LOW (ref 8.9–10.3)
Chloride: 99 mmol/L (ref 98–111)
Creatinine, Ser: 0.82 mg/dL (ref 0.61–1.24)
GFR, Estimated: >60 mL/min (ref >60)
Glucose, Bld: 121 mg/dL — ABNORMAL HIGH (ref 70–99)
Potassium: 3.9 mmol/L (ref 3.5–5.1)
Sodium: 135 mmol/L (ref 135–145)

## 2022-03-15 LAB — RAPID URINE DRUG SCREEN, HOSP PERFORMED
Amphetamines: NOT DETECTED
Barbiturates: NOT DETECTED
Benzodiazepines: NOT DETECTED
Cocaine: POSITIVE — AB
Opiates: NOT DETECTED
Tetrahydrocannabinol: NOT DETECTED

## 2022-03-15 LAB — CBC
HCT: 36 % — ABNORMAL LOW (ref 39.0–52.0)
Hemoglobin: 11.1 g/dL — ABNORMAL LOW (ref 13.0–17.0)
MCH: 24.6 pg — ABNORMAL LOW (ref 26.0–34.0)
MCHC: 30.8 g/dL (ref 30.0–36.0)
MCV: 79.8 fL — ABNORMAL LOW (ref 80.0–100.0)
Platelets: 539 10*3/uL — ABNORMAL HIGH (ref 150–400)
RBC: 4.51 MIL/uL (ref 4.22–5.81)
RDW: 14.6 % (ref 11.5–15.5)
WBC: 12.4 10*3/uL — ABNORMAL HIGH (ref 4.0–10.5)
nRBC: 0 % (ref 0.0–0.2)

## 2022-03-15 LAB — ECHOCARDIOGRAM COMPLETE
AR max vel: 3.16 cm2
AV Area VTI: 3.07 cm2
AV Area mean vel: 3.16 cm2
AV Mean grad: 2 mmHg
AV Peak grad: 4.5 mmHg
Ao pk vel: 1.06 m/s
Area-P 1/2: 5.66 cm2
Calc EF: 70.5 %
Height: 70 in
S' Lateral: 2.5 cm
Single Plane A2C EF: 74.8 %
Single Plane A4C EF: 67.9 %
Weight: 1908.3 oz

## 2022-03-15 LAB — TROPONIN I (HIGH SENSITIVITY)
Troponin I (High Sensitivity): 1212 ng/L (ref <18)
Troponin I (High Sensitivity): 2469 ng/L (ref <18)
Troponin I (High Sensitivity): 6234 ng/L (ref <18)
Troponin I (High Sensitivity): 3377 ng/L (ref <18)

## 2022-03-15 LAB — BRAIN NATRIURETIC PEPTIDE: B Natriuretic Peptide: 38.4 pg/mL (ref 0.0–100.0)

## 2022-03-15 LAB — HEPARIN LEVEL (UNFRACTIONATED): Heparin Unfractionated: <0.1 IU/mL — ABNORMAL LOW (ref 0.30–0.70)

## 2022-03-15 LAB — PROCALCITONIN: Procalcitonin: <0.1 ng/mL

## 2022-03-15 MED ORDER — ENOXAPARIN SODIUM 60 MG/0.6ML IJ SOSY
60.0000 mg | PREFILLED_SYRINGE | Freq: Two times a day (BID) | INTRAMUSCULAR | Status: DC
  Administered 2022-03-15 – 2022-03-16 (×2): 60 mg via SUBCUTANEOUS
  Filled 2022-03-15 (×2): qty 0.6

## 2022-03-15 MED ORDER — ACETAMINOPHEN 650 MG RE SUPP: 650.0000 mg | Freq: Four times a day (QID) | RECTAL | Status: DC | PRN

## 2022-03-15 MED ORDER — ASPIRIN 81 MG PO CHEW
81.0000 mg | CHEWABLE_TABLET | Freq: Every day | ORAL | Status: DC
  Administered 2022-03-16 – 2022-03-28 (×13): 81 mg via ORAL
  Filled 2022-03-15 (×13): qty 1

## 2022-03-15 MED ORDER — HYDROCODONE BIT-HOMATROP MBR 5-1.5 MG/5ML PO SOLN
5.0000 mL | Freq: Four times a day (QID) | ORAL | Status: DC | PRN
  Administered 2022-03-15 – 2022-03-26 (×14): 5 mL via ORAL
  Filled 2022-03-15 (×15): qty 5

## 2022-03-15 MED ORDER — FUROSEMIDE 10 MG/ML IJ SOLN
20.0000 mg | Freq: Two times a day (BID) | INTRAMUSCULAR | Status: DC
  Administered 2022-03-15 – 2022-03-16 (×2): 20 mg via INTRAVENOUS
  Filled 2022-03-15 (×2): qty 2

## 2022-03-15 MED ORDER — ACETAMINOPHEN 325 MG PO TABS
650.0000 mg | ORAL_TABLET | Freq: Four times a day (QID) | ORAL | Status: DC | PRN
  Administered 2022-03-15 – 2022-03-26 (×5): 650 mg via ORAL
  Filled 2022-03-15 (×7): qty 2

## 2022-03-15 MED ORDER — CALCIUM GLUCONATE-NACL 1-0.675 GM/50ML-% IV SOLN
1.0000 g | Freq: Once | INTRAVENOUS | Status: AC
  Administered 2022-03-15: 1000 mg via INTRAVENOUS
  Filled 2022-03-15: qty 50

## 2022-03-15 MED ORDER — ENOXAPARIN SODIUM 60 MG/0.6ML IJ SOSY
60.0000 mg | PREFILLED_SYRINGE | INTRAMUSCULAR | Status: AC
  Administered 2022-03-15: 60 mg via SUBCUTANEOUS
  Filled 2022-03-15: qty 0.6

## 2022-03-15 MED ORDER — ONDANSETRON HCL 4 MG/2ML IJ SOLN: 4.0000 mg | Freq: Four times a day (QID) | INTRAMUSCULAR | Status: DC | PRN

## 2022-03-15 MED ORDER — ATORVASTATIN CALCIUM 40 MG PO TABS
40.0000 mg | ORAL_TABLET | Freq: Every day | ORAL | Status: DC
  Administered 2022-03-15 – 2022-03-26 (×12): 40 mg via ORAL
  Filled 2022-03-15 (×12): qty 1

## 2022-03-15 MED ORDER — OXYCODONE HCL 5 MG PO TABS
5.0000 mg | ORAL_TABLET | Freq: Four times a day (QID) | ORAL | Status: DC | PRN
  Administered 2022-03-15 – 2022-03-25 (×12): 5 mg via ORAL
  Filled 2022-03-15 (×13): qty 1

## 2022-03-15 MED ORDER — CLOPIDOGREL BISULFATE 75 MG PO TABS
75.0000 mg | ORAL_TABLET | Freq: Every day | ORAL | Status: DC
  Administered 2022-03-15 – 2022-03-28 (×14): 75 mg via ORAL
  Filled 2022-03-15 (×14): qty 1

## 2022-03-15 MED ORDER — BICTEGRAVIR-EMTRICITAB-TENOFOV 50-200-25 MG PO TABS
1.0000 | ORAL_TABLET | Freq: Every day | ORAL | Status: DC
  Administered 2022-03-15 – 2022-03-28 (×14): 1 via ORAL
  Filled 2022-03-15 (×15): qty 1

## 2022-03-15 MED ORDER — HEPARIN (PORCINE) 25000 UT/250ML-% IV SOLN
800.0000 [IU]/h | INTRAVENOUS | Status: DC
  Administered 2022-03-15: 650 [IU]/h via INTRAVENOUS
  Filled 2022-03-15: qty 250

## 2022-03-15 MED ORDER — PERFLUTREN LIPID MICROSPHERE
1.0000 mL | INTRAVENOUS | Status: AC | PRN
  Administered 2022-03-15: 2 mL via INTRAVENOUS

## 2022-03-15 MED ORDER — ALBUTEROL SULFATE (2.5 MG/3ML) 0.083% IN NEBU
2.5000 mg | INHALATION_SOLUTION | RESPIRATORY_TRACT | Status: DC | PRN
  Administered 2022-03-25 – 2022-03-26 (×2): 2.5 mg via RESPIRATORY_TRACT
  Filled 2022-03-15 (×2): qty 3

## 2022-03-15 MED ORDER — SODIUM CHLORIDE 0.9% FLUSH
3.0000 mL | Freq: Two times a day (BID) | INTRAVENOUS | Status: DC
  Administered 2022-03-15 – 2022-03-28 (×25): 3 mL via INTRAVENOUS

## 2022-03-15 MED ORDER — ONDANSETRON HCL 4 MG PO TABS: 4.0000 mg | ORAL_TABLET | Freq: Four times a day (QID) | ORAL | Status: DC | PRN

## 2022-03-15 MED ORDER — BENZONATATE 100 MG PO CAPS
200.0000 mg | ORAL_CAPSULE | Freq: Three times a day (TID) | ORAL | Status: DC | PRN
  Administered 2022-03-16 – 2022-03-27 (×12): 200 mg via ORAL
  Filled 2022-03-15 (×14): qty 2

## 2022-03-15 MED ORDER — HEPARIN BOLUS VIA INFUSION
2000.0000 [IU] | Freq: Once | INTRAVENOUS | Status: AC
  Administered 2022-03-15: 2000 [IU] via INTRAVENOUS
  Filled 2022-03-15: qty 2000

## 2022-03-15 MED ORDER — DILTIAZEM HCL 30 MG PO TABS
30.0000 mg | ORAL_TABLET | Freq: Three times a day (TID) | ORAL | Status: DC
  Administered 2022-03-15 – 2022-03-16 (×4): 30 mg via ORAL
  Filled 2022-03-15 (×4): qty 1

## 2022-03-15 MED ORDER — IOHEXOL 350 MG/ML SOLN
75.0000 mL | Freq: Once | INTRAVENOUS | Status: AC | PRN
  Administered 2022-03-15: 75 mL via INTRAVENOUS

## 2022-03-15 MED ORDER — MORPHINE SULFATE (PF) 2 MG/ML IV SOLN
2.0000 mg | INTRAVENOUS | Status: DC | PRN
  Administered 2022-03-18: 2 mg via INTRAVENOUS
  Filled 2022-03-15: qty 1

## 2022-03-15 MED ORDER — HEPARIN BOLUS VIA INFUSION
3000.0000 [IU] | Freq: Once | INTRAVENOUS | Status: AC
  Administered 2022-03-15: 3000 [IU] via INTRAVENOUS
  Filled 2022-03-15: qty 3000

## 2022-03-15 NOTE — Progress Notes (Signed)
ANTICOAGULATION CONSULT NOTE   Pharmacy Consult for heparin Indication: chest pain/ACS  No Known Allergies  Patient Measurements: Height: 5\' 10"  (177.8 cm) Weight: 54.1 kg (119 lb 4.3 oz) IBW/kg (Calculated) : 73  Vital Signs: Temp: 98.2 F (36.8 C) (10/14 1117) Temp Source: Oral (10/14 1117) BP: 107/70 (10/14 0845) Pulse Rate: 87 (10/14 0845)  Labs: Recent Labs    03/13/22 1158 03/15/22 0153 03/15/22 0322 03/15/22 0805 03/15/22 1019  HGB 11.7* 11.1*  --   --   --   HCT 37.7* 36.0*  --   --   --   PLT 656* 539*  --   --   --   HEPARINUNFRC  --   --   --   --  <0.10*  CREATININE 0.64 0.82  --   --   --   TROPONINIHS  --  1,212* 2,469* 6,234*  --      Estimated Creatinine Clearance: 72.4 mL/min (by C-G formula based on SCr of 0.82 mg/dL).   Medical History: Past Medical History:  Diagnosis Date   Hernia, inguinal, right    HIV (human immunodeficiency virus infection) ([place])     Assessment: 61yo male c/o central CP associated with SOB, initial troponin found to be elevated >> to start heparin.  Initial heparin level undetectable on 650 units/hr, rate confirmed and no infusion issues.  CT angio chest negative for PE  Goal of Therapy:  Heparin level 0.3-0.7 units/ml Monitor platelets by anticoagulation protocol: Yes   Plan:  Heparin 2000 units IV x 1, and gtt rate increase to 800 units/hr F/u 6 hour heparin level F/u cards plan  Bertis Ruddy, PharmD Clinical Pharmacist ED Pharmacist Phone # [phone] 03/15/2022 11:46 AM

## 2022-03-15 NOTE — ED Notes (Signed)
Pt became severely dyspneic from sitting up in the bed. Oxygen saturations decreased to 85%. Oxygen via [place] increased to 6L with improvement to 94%.

## 2022-03-15 NOTE — Consult Note (Signed)
CARDIOLOGY CONSULT NOTE  Patient ID: Steven Johnston MRN: [id number] DOB/AGE: 10-20-1960 61 y.o.  Admit date: 03/15/2022 Attending physician: Norval Morton, MD Primary Physician:  Patient, No Pcp Per Outpatient Cardiologist: NA Inpatient Cardiologist: Rex Kras, DO, [facility]  Reason of consultation: NSTEMI Referring physician: Quincy Carnes PA-C  Chief complaint: chest pain.  HPI:  Steven Johnston is a 61 y.o. African-American male who presents with a chief complaint of "chest pain." His past medical history and cardiovascular risk factors include: HIV, hepatitis, stage IVb non small cell lung cancer with widespread nodal disease in the chest/abdomen/skeletal system/cutaneous involvement/brain metastases undergoing palliative radiation and immunotherapy, former smoker, cocaine use.  Patient presents to the hospital with chief complaint of chest pain.  Symptoms started yesterday night 03/14/2022 at approximately 10 PM while he was at rest.  The symptoms were ongoing for at least 2 hours without any significant relief and therefore EMS was activated and patient was brought to the ED.  Patient stated the chest pain was located substernally, intensity 12 out of 10, nonexertional, nonradiating, nonpositional, nonpleuritic.  Pain improved with full dose aspirin and sublingual nitroglycerin provided by EMT.  High sensitive troponins have peaked at 6234.  EKG shows sinus rhythm without ST-T changes to suggest STEMI.  Currently the chest pain has improved to 5/10.  Urine drug screen positive for cocaine (patient denies use).  CT of the chest negative for pulmonary embolism but per report " Progressive thoracic malignancy with increasing mass-like consolidation of the left upper lobe, progressive lymphangitic spread of tumor throughout the left lung, enlarging malignant left pleural effusion, enlarging metastatic pulmonary nodules in the right lung, lymphadenopathy in the left supraclavicular,  retroperitoneal, mediastinal and bilateral hilar nodal stations, and osseous metastatic disease in the right scapula, as above."  Patient has been evaluated by oncology who feels that the recent NSTEMI may have been precipitated by multiple risk factors given his comorbid conditions and also palliative radiotherapy and the possibly immunotherapy.  They are recommending conservative management for his NSTEMI and given his metastatic lung cancer.  Patient's chest pain has improved since presentation with IV heparin.  Hemodynamically stable.  Not requesting pressor support or inotropic support.  ALLERGIES: No Known Allergies  PAST MEDICAL HISTORY: Past Medical History:  Diagnosis Date   Hernia, inguinal, right    HIV (human immunodeficiency virus infection) ([place])   Metastatic lung cancer  PAST SURGICAL HISTORY: Past Surgical History:  Procedure Laterality Date   BRONCHIAL WASHINGS  10/05/2020   Procedure: BRONCHIAL WASHINGS;  Surgeon: Julian Hy, DO;  Location: [place] [facility];  Service: Endoscopy;;   INGUINAL HERNIA REPAIR Right 02/17/2018   Procedure: OPEN HERNIA REPAIR RIGHT INGUINAL INCARCERATED WITH DIAGONSTIC LAPAROSCOPY FOR INCARCERATED HERNIA;  Surgeon: Ileana Roup, MD;  Location: [place];  Service: General;  Laterality: Right;   VIDEO BRONCHOSCOPY N/A 10/05/2020   Procedure: VIDEO BRONCHOSCOPY WITHOUT FLUORO;  Surgeon: Julian Hy, DO;  Location: [place] [facility];  Service: Endoscopy;  Laterality: N/A;    FAMILY HISTORY: The patient's family history includes Pancreatic cancer in his brother.   SOCIAL HISTORY:  Former smoker. Socially consumes alcohol. Cocaine use  MEDICATIONS: Current Outpatient Medications  Medication Instructions   albuterol (VENTOLIN HFA) 108 (90 Base) MCG/ACT inhaler 2 puffs, Inhalation, Every 6 hours PRN   benzonatate (TESSALON) 200 mg, Oral, 3 times daily PRN   bictegravir-emtricitabine-tenofovir AF (BIKTARVY) 50-200-25 MG TABS tablet 1 tablet,  Oral, Daily   diclofenac Sodium (VOLTAREN) 2 g, Topical, 4 times daily  mirtazapine (REMERON) 7.5 mg, Oral, Daily at bedtime   nicotine (NICODERM CQ - DOSED IN MG/24 HOURS) 14 mg, Transdermal, Daily   oxyCODONE (ROXICODONE) 5 mg, Oral, Every 6 hours PRN   prochlorperazine (COMPAZINE) 10 mg, Oral, Every 6 hours PRN    REVIEW OF SYSTEMS: Review of Systems  Constitutional: Positive for decreased appetite and weight loss.  Cardiovascular:  Positive for chest pain. Negative for claudication, dyspnea on exertion, irregular heartbeat, leg swelling, near-syncope, orthopnea, palpitations, paroxysmal nocturnal dyspnea and syncope.  Respiratory:  Positive for cough and shortness of breath (Chronic and stable).   Hematologic/Lymphatic: Negative for bleeding problem.  Musculoskeletal:  Positive for joint pain. Negative for muscle cramps and myalgias.  Neurological:  Negative for dizziness and light-headedness.  Allergic/Immunologic: Positive for HIV exposure (HIV/AIDS).  All other systems reviewed and are negative.   PHYSICAL EXAM: Today's Vitals   03/15/22 0830 03/15/22 0845 03/15/22 1117 03/15/22 1245  BP: 118/80 107/70  111/73  Pulse: 86 87  95  Resp: (!) 27 (!) 25  (!) 27  Temp:   98.2 F (36.8 C)   TempSrc:   Oral   SpO2: 97% 97%  95%  Weight:      Height:      PainSc:       Body mass index is 17.11 kg/m.  No intake or output data in the 24 hours ending 03/15/22 1248  Net IO Since Admission: No IO data has been entered for this period [03/15/22 1248]  CONSTITUTIONAL: Appears older than stated age, cachectic, hemodynamically stable, no acute distress SKIN: Skin is warm and dry. No rash noted. No cyanosis. No pallor. No jaundice HEAD: Normocephalic and atraumatic.  EYES: No scleral icterus MOUTH/THROAT: Dry mucous membrane, poor oral dentition NECK: No JVD present. No thyromegaly noted. No carotid bruits  CHEST Normal respiratory effort. No intercostal retractions  LUNGS:  Decreased breath sounds bilaterally left greater than right, diffuse rhonchi CARDIOVASCULAR: Regular, tachycardic, no murmurs rubs or gallops appreciated secondary to tachycardia.   ABDOMINAL: Soft, cachectic, positive bowel sounds in all 4 quadrants,  EXTREMITIES: No pitting edema, warm to touch.  HEMATOLOGIC: No significant bruising NEUROLOGIC: Oriented to person, place, and time. Nonfocal. Normal muscle tone.  PSYCHIATRIC: Normal mood and affect. Normal behavior. Cooperative  RADIOLOGY: ECHOCARDIOGRAM COMPLETE  Result Date: 03/15/2022    ECHOCARDIOGRAM REPORT   Patient Name:   PACER DORN Date of Exam: 03/15/2022 Medical Rec #:  [id number]       Height:       70.0 in Accession #:    [id number]      Weight:       119.3 lb Date of Birth:  [birth year]        BSA:          1.676 m Patient Age:    80 years        BP:           107/70 mmHg Patient Gender: M               HR:           84 bpm. Exam Location:  Inpatient Procedure: 2D Echo, 3D Echo, Cardiac Doppler, Color Doppler, Intracardiac            Opacification Agent and Strain Analysis Indications:    NSTEMI  History:        Patient has no prior history of Echocardiogram examinations.  Signs/Symptoms:Chest Pain. HIV. Lung cancer.  Sonographer:    Merrie Roof RDCS Referring Phys: [id number] [place]  1. Left ventricular ejection fraction, by estimation, is 60 to 65%. The left ventricle has normal function. The left ventricle has no regional wall motion abnormalities. Left ventricular diastolic parameters were normal.  2. Right ventricular systolic function is normal. The right ventricular size is visually dilated (mild).  3. Left atrial size was moderately dilated.  4. Small to moderate size pericardial effusion. The pericardial effusion is posterior to the left ventricle. There is no evidence of cardiac tamponade. Large pleural effusion in the left lateral region.  5. The mitral valve is degenerative. Trivial mitral valve  regurgitation. No evidence of mitral stenosis.  6. The aortic valve is grossly normal. Aortic valve regurgitation is not visualized. No aortic stenosis is present.  7. Aortic Proximal ascending aorta normal in size. There is borderline dilatation of the aortic root, measuring 38 mm.  8. The inferior vena cava is normal in size with greater than 50% respiratory variability, suggesting right atrial pressure of 3 mmHg. Comparison(s): No prior Echocardiogram. Conclusion(s)/Recommendation(s): No left ventricular mural or apical thrombus/thrombi. FINDINGS  Left Ventricle: Left ventricular ejection fraction, by estimation, is 60 to 65%. The left ventricle has normal function. The left ventricle has no regional wall motion abnormalities. Definity contrast agent was given IV to delineate the left ventricular  endocardial borders. Global longitudinal strain performed but not reported based on interpreter judgement due to suboptimal tracking. The left ventricular internal cavity size was normal in size. There is no left ventricular hypertrophy. Left ventricular diastolic parameters were normal. Right Ventricle: The right ventricular size is visually dilated (mild). No increase in right ventricular wall thickness. Right ventricular systolic function is normal. Left Atrium: Left atrial size was moderately dilated. Right Atrium: Right atrial size was not well visualized. Pericardium: Small to moderate size pericardial effusion. The pericardial effusion is posterior to the left ventricle. There is no evidence of cardiac tamponade. Mitral Valve: The mitral valve is degenerative in appearance. There is moderate thickening of the mitral valve leaflet(s). Normal mobility of the mitral valve leaflets. Trivial mitral valve regurgitation. No evidence of mitral valve stenosis. Tricuspid Valve: The tricuspid valve is grossly normal. Tricuspid valve regurgitation is mild . No evidence of tricuspid stenosis. Aortic Valve: The aortic valve is  grossly normal. Aortic valve regurgitation is not visualized. No aortic stenosis is present. Aortic valve mean gradient measures 2.0 mmHg. Aortic valve peak gradient measures 4.5 mmHg. Aortic valve area, by VTI measures 3.07 cm. Pulmonic Valve: The pulmonic valve was normal in structure. Pulmonic valve regurgitation is not visualized. No evidence of pulmonic stenosis. Aorta: Proximal ascending aorta normal in size. There is borderline dilatation of the aortic root, measuring 38 mm. Venous: The inferior vena cava is normal in size with greater than 50% respiratory variability, suggesting right atrial pressure of 3 mmHg. IAS/Shunts: The interatrial septum was not well visualized. Additional Comments: There is a large pleural effusion in the left lateral region.  LEFT VENTRICLE PLAX 2D LVIDd:         3.70 cm     Diastology LVIDs:         2.50 cm     LV e' medial:    11.60 cm/s LV PW:         1.00 cm     LV E/e' medial:  8.2 LV IVS:        1.10 cm     LV  e' lateral:   11.85 cm/s LVOT diam:     2.20 cm     LV E/e' lateral: 8.1 LV SV:         60 LV SV Index:   36 LVOT Area:     3.80 cm                             3D Volume EF: LV Volumes (MOD)           3D EF:        71 % LV vol d, MOD A2C: 92.7 ml LV EDV:       162 ml LV vol d, MOD A4C: 62.4 ml LV ESV:       47 ml LV vol s, MOD A2C: 23.4 ml LV SV:        115 ml LV vol s, MOD A4C: 20.0 ml LV SV MOD A2C:     69.3 ml LV SV MOD A4C:     62.4 ml LV SV MOD BP:      60.4 ml RIGHT VENTRICLE             IVC RV Basal diam:  4.60 cm     IVC diam: 1.60 cm RV S prime:     12.30 cm/s TAPSE (M-mode): 2.1 cm LEFT ATRIUM             Index LA diam:        2.10 cm 1.25 cm/m LA Vol (A2C):   82.0 ml 48.94 ml/m LA Vol (A4C):   86.2 ml 51.44 ml/m LA Biplane Vol: 85.6 ml 51.09 ml/m  AORTIC VALVE AV Area (Vmax):    3.16 cm AV Area (Vmean):   3.16 cm AV Area (VTI):     3.07 cm AV Vmax:           106.00 cm/s AV Vmean:          71.900 cm/s AV VTI:            0.197 m AV Peak Grad:      4.5  mmHg AV Mean Grad:      2.0 mmHg LVOT Vmax:         88.00 cm/s LVOT Vmean:        59.700 cm/s LVOT VTI:          0.159 m LVOT/AV VTI ratio: 0.81  AORTA Ao Root diam: 3.80 cm Ao Asc diam:  2.90 cm MITRAL VALVE MV Area (PHT): 5.66 cm    SHUNTS MV Decel Time: 134 msec    Systemic VTI:  0.16 m MV E velocity: 95.40 cm/s  Systemic Diam: 2.20 cm MV A velocity: 68.10 cm/s MV E/A ratio:  1.40 Dellia Donnelly DO Electronically signed by Rex Kras DO Signature Date/Time: 03/15/2022/12:45:23 PM    Final    CT Angio Chest PE W and/or Wo Contrast  Result Date: 03/15/2022 CLINICAL DATA:  61 year old male with central chest pain and shortness of breath this evening. History of lung cancer, currently undergoing chemotherapy. EXAM: CT ANGIOGRAPHY CHEST WITH CONTRAST TECHNIQUE: Multidetector CT imaging of the chest was performed using the standard protocol during bolus administration of intravenous contrast. Multiplanar CT image reconstructions and MIPs were obtained to evaluate the vascular anatomy. RADIATION DOSE REDUCTION: This exam was performed according to the departmental dose-optimization program which includes automated exposure control, adjustment of the mA and/or kV according to patient size and/or use of iterative reconstruction technique.  CONTRAST:  66mL OMNIPAQUE IOHEXOL 350 MG/ML SOLN COMPARISON:  PET-CT 02/26/2022.  Chest CT 02/05/2022. FINDINGS: Cardiovascular: No filling defects within the pulmonary arterial tree to suggest pulmonary embolism. Heart size is normal. No definite pericardial fluid or thickening. No pericardial calcification. Atherosclerotic calcifications are noted in the left main, left anterior descending, left circumflex and right coronary arteries. Mediastinum/Nodes: Numerous borderline enlarged and enlarged mediastinal and bilateral hilar lymph nodes are noted measuring up to 1.7 cm in the low right paratracheal nodal station and 1.8 cm in the right hilar region. Bulky soft tissues also noted  in the left hilar region, contiguous with adjacent abnormal soft tissue along, difficult to accurately measure. Esophagus is unremarkable in appearance. No axillary lymphadenopathy. Left-sided supraclavicular lymphadenopathy measuring up to 1.6 cm in short axis (axial image 26 of series 5). Lungs/Pleura: Increasingly apparent nodular opacities in the base of the right lung measuring up to 2.8 x 1.7 cm (axial image 149 of series 6), likely progressive metastatic lesions. Multiple other smaller pulmonary nodules scattered elsewhere in the right lung. Extensive mass-like consolidation throughout the left lung, most severe in the left upper lobe but with additional areas of nodularity and irregular nodular septal thickening throughout the left lower lobe, progressive compared to the prior examination. Much of this in the left upper lobe likely represents tumor with additional postobstructive changes, and evolving lymphangitic spread of disease throughout the entirety of the left lung. Small left pleural effusion predominantly subpulmonic and in the dependent portion of the base of the left hemithorax. No right pleural effusion. Upper Abdomen: Soft tissue surrounding the aorta in the upper abdomen, presumably lymphadenopathy, poorly evaluated on today's arterial phase examination. Musculoskeletal: Large infiltrative lytic lesion in the right scapula centered in the region of the glenoid. Review of the MIP images confirms the above findings. IMPRESSION: 1. No evidence of pulmonary embolism. 2. Progressive thoracic malignancy with increasing mass-like consolidation of the left upper lobe, progressive lymphangitic spread of tumor throughout the left lung, enlarging malignant left pleural effusion, enlarging metastatic pulmonary nodules in the right lung, lymphadenopathy in the left supraclavicular, retroperitoneal, mediastinal and bilateral hilar nodal stations, and osseous metastatic disease in the right scapula, as above.  3. Aortic atherosclerosis, in addition to left main and three-vessel coronary artery disease. Aortic Atherosclerosis (ICD10-I70.0). Electronically Signed   By: Vinnie Langton M.D.   On: 03/15/2022 05:32   DG Chest 2 View  Result Date: 03/15/2022 CLINICAL DATA:  Chest pain and increasing shortness of breath, history of lung carcinoma EXAM: CHEST - 2 VIEW COMPARISON:  02/26/2022 PET CT, plain film from 02/06/2022 FINDINGS: Persistent masslike consolidation is noted apex of the left lung with increasing pleural based density laterally when compared with the prior plain film examination. There is also likely increasing effusion present as well as neoplastic involvement in the non consolidated lung. Right lung is mildly hyperinflated. The erosive changes in the right scapula are again identified but better visualized on prior PET-CT. IMPRESSION: Progressive consolidation involving the left lung. The majority of this is related to the known neoplasm although a superimposed more acute process such as pneumonia may be present as well given the abrupt onset. Right lung remains clear. Electronically Signed   By: Inez Catalina M.D.   On: 03/15/2022 02:26    LABORATORY DATA: Lab Results  Component Value Date   WBC 12.4 (H) 03/15/2022   HGB 11.1 (L) 03/15/2022   HCT 36.0 (L) 03/15/2022   MCV 79.8 (L) 03/15/2022   PLT 539 (  H) 03/15/2022    Recent Labs  Lab 03/13/22 1158 03/15/22 0153  NA 141 135  K 4.2 3.9  CL 106 99  CO2 30 26  BUN 16 13  CREATININE 0.64 0.82  CALCIUM 8.4* 8.2*  PROT 7.0  --   BILITOT 0.3  --   ALKPHOS 77  --   ALT 14  --   AST 23  --   GLUCOSE 100* 121*    Lipid Panel  Lab Results  Component Value Date   CHOL 132 01/17/2022   HDL 34 (L) 01/17/2022   LDLCALC 84 01/17/2022   TRIG 64 01/17/2022   CHOLHDL 3.9 01/17/2022    BNP (last 3 results) No results for input(s): "BNP" in the last 8760 hours.  HEMOGLOBIN A1C Lab Results  Component Value Date   HGBA1C 5.5  01/21/2021   MPG 111 01/21/2021    Cardiac Panel (last 3 results) Recent Labs    03/15/22 0322 03/15/22 0805 03/15/22 1014  TROPONINIHS 2,469* 6,234* 3,377*     TSH Recent Labs    03/13/22 1158  TSH 1.041   Drugs of Abuse     Component Value Date/Time   LABOPIA NONE DETECTED 03/15/2022 1014   COCAINSCRNUR POSITIVE (A) 03/15/2022 1014   LABBENZ NONE DETECTED 03/15/2022 1014   AMPHETMU NONE DETECTED 03/15/2022 1014   THCU NONE DETECTED 03/15/2022 1014   LABBARB NONE DETECTED 03/15/2022 1014    RADIOLOGY: Chest x-ray 03/15/2022:  Progressive consolidation involving the left lung. The majority of  this is related to the known neoplasm although a superimposed more acute process such as pneumonia may be present as well given the abrupt onset.  CARDIAC DATABASE: EKG: 03/15/2022 Sinus rhythm, 99 bpm, incomplete right bundle branch block, without underlying injury pattern.  Sinus tachycardia, 104 bpm, without underlying ischemia or injury pattern.  Sinus rhythm, 81 bpm, without underlying ischemia or injury pattern.  Echocardiogram: 03/15/2022: LVEF 60-65%, no regional wall motion abnormalities, normal diastolic function, right ventricular size mildly dilated, RV systolic function preserved, moderately dilated left atrium, small to moderate pericardial effusion without tamponade physiology, large left pleural effusion, trivial MR aortic root mildly dilated at 38 mm.  Estimated RAP 3 mmHg  IMPRESSION & RECOMMENDATIONS: Steven Johnston is a 61 y.o. African-American male whose past medical history and cardiovascular risk factors include: HIV, hepatitis, stage IVb non small cell lung cancer with widespread nodal disease in the chest/abdomen/skeletal system/cutaneous involvement/brain metastases undergoing palliative radiation and immunotherapy, former smoker, cocaine use.  Impression:  NSTEMI Elevated high sensitive troponins. Precordial pain. Aortic  atherosclerosis Coronary artery calcification-noted on nongated CT study Cocaine use Stage IV metastatic non-small cell lung cancer HIV   Plan:  NSTEMI Presented with symptoms of precordial pain since 10 PM on 03/14/2022. The discomfort is nonexertional. But did improve with full dose aspirin and sublingual nitroglycerin tablets provided by EMT.   EKG not consistent with STEMI and no ST segment changes to suggest ischemia.   High sensitive troponins peaked at 6234. Echo: Preserved LVEF, no regional wall motion abnormalities, no significant valvular heart disease.  Urine drug screen positive for cocaine. Other contributing factors that may have precipitated the NSTEMI coronary vasospasm due to cocaine use, palliative radiation and immunotherapy (per oncology consult). I agree with oncology that given his comorbidities and metastatic lung cancer as outlined above invasive angiography may not be ideal.  However did discuss the risks, benefits, limitations and alternatives to angiography with the patient and son.  They too also prefer conservative  management given his stage IV metastatic lung cancer. Continue IV heparin drip per ACS protocol for 48 hours. Start aspirin 81 mg p.o. daily and Plavix 75 mg p.o. daily. Start atorvastatin 40 mg p.o. nightly We will avoid beta-blockers given his second episode of cocaine use.  Start diltiazem 30 mg p.o. 3 times daily. Start Lasix 20 mg IV push twice daily. Sublingual nitroglycerin tablets to use on as needed basis. We will uptitrate GDMT as hemodynamics and laboratory values allow. Recommend telemetry. Should symptoms of chest pain continue despite 3 sublingual nitroglycerin tablets, the patient becomes hemodynamically unstable (hypotension, congestive heart failure), or develop electrical instability (VT/VF), please notify cardiology on call for reevaluation.  Aortic atherosclerosis/coronary artery calcification: Aspirin and statin  therapy.  Cocaine use: Patient refuses but UDS positive.  We will avoid beta-blockers.  Stage IV metastatic lung cancer: Undergoing palliative radiation and immunotherapy.  Shortness of breath (chronic and stable), coughing: Multifactorial but likely secondary to progressive lung cancer.  Clinically not euvolemic, echocardiogram also notes an estimated RAP of 3 mmHg, a trial of Lasix as discussed above.   HIV: Medical therapy, defer to primary  From a cardiovascular standpoint agree with palliative care consult to help with goals of care given his comorbidities and advancing stage IV metastatic lung cancer.  Total encounter time 86 minutes. *Total Encounter Time as defined by the Centers for Medicare and Medicaid Services includes, in addition to the face-to-face time of a patient visit (documented in the note above) non-face-to-face time: obtaining and reviewing outside history, ordering and reviewing medications, tests or procedures, addressing his NSTEMI, care coordination (communications with ED provider, attending physician, speaking to his son) and documentation in the medical record.  Patient's questions and concerns were addressed to his satisfaction. He voices understanding of the instructions provided during this encounter.   This note was created using a voice recognition software as a result there may be grammatical errors inadvertently enclosed that do not reflect the nature of this encounter. Every attempt is made to correct such errors.  [facility]  Pager: [phone] Office: [phone] 03/15/2022, 12:48 PM

## 2022-03-15 NOTE — Progress Notes (Signed)
[place] for heparin>>lovenox Indication: chest pain/ACS  No Known Allergies  Patient Measurements: Height: 5\' 10"  (177.8 cm) Weight: 53.7 kg (118 lb 6.2 oz) IBW/kg (Calculated) : 73  Vital Signs: Temp: 98.7 F (37.1 C) (10/14 1405) Temp Source: Oral (10/14 1405) BP: 108/76 (10/14 1405) Pulse Rate: 91 (10/14 1405)  Labs: Recent Labs    03/13/22 1158 03/15/22 0153 03/15/22 0153 03/15/22 0322 03/15/22 0805 03/15/22 1014 03/15/22 1019  HGB 11.7* 11.1*  --   --   --   --   --   HCT 37.7* 36.0*  --   --   --   --   --   PLT 656* 539*  --   --   --   --   --   HEPARINUNFRC  --   --   --   --   --   --  <0.10*  CREATININE 0.64 0.82  --   --   --   --   --   TROPONINIHS  --  1,212*   < > 2,469* 6,234* 3,377*  --    < > = values in this interval not displayed.     Estimated Creatinine Clearance: 71.9 mL/min (by C-G formula based on SCr of 0.82 mg/dL).   Medical History: Past Medical History:  Diagnosis Date   Hernia, inguinal, right    HIV (human immunodeficiency virus infection) ([place])     Assessment: 61yo male c/o central CP associated with SOB, initial troponin found to be elevated >> to start heparin.  Initial heparin level undetectable on 650 units/hr, rate confirmed and no infusion issues.  CT angio chest negative for PE  Addendum  Conservative management for ACS per onc. D/w Tamala Julian and we will convert heparin to Lovenox for now.  Goal of Therapy:  Anti-Xa - 0.6-1 Monitor platelets by anticoagulation protocol: Yes   Plan:  Dc heparin Lovenox 60mg  SQ BID  Onnie Boer, PharmD, BCIDP, AAHIVP, CPP Infectious Disease Pharmacist 03/15/2022 2:41 PM

## 2022-03-15 NOTE — Progress Notes (Signed)
DIAGNOSIS: Stage IVb (T3, N3, M1 C) non-small cell lung cancer, adenocarcinoma presented with large left upper lobe lung mass in addition to lymphangitic carcinomatosis, widespread nodal disease in the chest and abdomen, bony involvement in the right scapula and proximal femur with muscular involvement and cutaneous involvement, and metastatic disease to the brain. There is a spiculated nodule in the right chest without increased metabolic activity which could represent indolent synchronous primary. Diagnosed in September 2023.   [employer] Biomarkers: MET amplification and BRCA2 mutation.   PDL1 Expression: 93%    PRIOR THERAPY: None   CURRENT THERAPY: 1) palliative radiation to the painful metastatic scapular lesion under the care of Dr. Sondra Come.  Last day radiation scheduled for 03/12/2022 2) SRS to the metastatic brain lesions under the care of Dr. Sondra Come. TBD 3) Palliative systemic immunotherapy with Libtayo IV every 3 weeks. First dose on 03/13/22.  Status post 1 cycle  Subjective: The patient is seen and examined today.  He presented to the emergency department earlier today with chest pain and he was diagnosed with NSTEMI.  He received the first dose of his treatment with immunotherapy on 03/13/2022.  He tolerated the treatment well.  He is feeling a little bit better today.  He was treated with nitroglycerin and started IV heparin infusion and morphine for the severe pain.  He denied having any shortness of breath, cough or hemoptysis.  He has no nausea, vomiting, diarrhea or constipation.  He had CT angiogram of the chest performed earlier today that showed no evidence for pulmonary embolism but there was a progressive thoracic adenopathy with increasing masslike consolidation in the left upper lobe and progressive lymphangitic spread of tumor throughout the left lung with enlarging malignant left pleural effusion and enlarging metastatic pulmonary nodules in the right lung, and adenopathy in  the left supraclavicular, retroperitoneum, mediastinal and bilateral hilar nodal station as well as the osseous metastatic disease.  Objective: Vital signs in last 24 hours: Temp:  [98.1 F (36.7 C)-98.5 F (36.9 C)] 98.5 F (36.9 C) (10/14 0622) Pulse Rate:  [74-105] 87 (10/14 0845) Resp:  [18-39] 25 (10/14 0845) BP: (99-118)/(63-80) 107/70 (10/14 0845) SpO2:  [87 %-99 %] 97 % (10/14 0845) Weight:  [119 lb 4.3 oz (54.1 kg)] 119 lb 4.3 oz (54.1 kg) (10/14 0325)  Intake/Output from previous day: No intake/output data recorded. Intake/Output this shift: No intake/output data recorded.  General appearance: alert, cooperative, fatigued, and no distress Resp: clear to auscultation bilaterally Cardio: regular rate and rhythm, S1, S2 normal, no murmur, click, rub or gallop GI: soft, non-tender; bowel sounds normal; no masses,  no organomegaly Extremities: extremities normal, atraumatic, no cyanosis or edema  Lab Results:  Recent Labs    03/13/22 1158 03/15/22 0153  WBC 12.6* 12.4*  HGB 11.7* 11.1*  HCT 37.7* 36.0*  PLT 656* 539*   BMET Recent Labs    03/13/22 1158 03/15/22 0153  NA 141 135  K 4.2 3.9  CL 106 99  CO2 30 26  GLUCOSE 100* 121*  BUN 16 13  CREATININE 0.64 0.82  CALCIUM 8.4* 8.2*    Studies/Results: CT Angio Chest PE W and/or Wo Contrast  Result Date: 03/15/2022 CLINICAL DATA:  61 year old male with central chest pain and shortness of breath this evening. History of lung cancer, currently undergoing chemotherapy. EXAM: CT ANGIOGRAPHY CHEST WITH CONTRAST TECHNIQUE: Multidetector CT imaging of the chest was performed using the standard protocol during bolus administration of intravenous contrast. Multiplanar CT image reconstructions and MIPs were  obtained to evaluate the vascular anatomy. RADIATION DOSE REDUCTION: This exam was performed according to the departmental dose-optimization program which includes automated exposure control, adjustment of the mA  and/or kV according to patient size and/or use of iterative reconstruction technique. CONTRAST:  29m OMNIPAQUE IOHEXOL 350 MG/ML SOLN COMPARISON:  PET-CT 02/26/2022.  Chest CT 02/05/2022. FINDINGS: Cardiovascular: No filling defects within the pulmonary arterial tree to suggest pulmonary embolism. Heart size is normal. No definite pericardial fluid or thickening. No pericardial calcification. Atherosclerotic calcifications are noted in the left main, left anterior descending, left circumflex and right coronary arteries. Mediastinum/Nodes: Numerous borderline enlarged and enlarged mediastinal and bilateral hilar lymph nodes are noted measuring up to 1.7 cm in the low right paratracheal nodal station and 1.8 cm in the right hilar region. Bulky soft tissues also noted in the left hilar region, contiguous with adjacent abnormal soft tissue along, difficult to accurately measure. Esophagus is unremarkable in appearance. No axillary lymphadenopathy. Left-sided supraclavicular lymphadenopathy measuring up to 1.6 cm in short axis (axial image 26 of series 5). Lungs/Pleura: Increasingly apparent nodular opacities in the base of the right lung measuring up to 2.8 x 1.7 cm (axial image 149 of series 6), likely progressive metastatic lesions. Multiple other smaller pulmonary nodules scattered elsewhere in the right lung. Extensive mass-like consolidation throughout the left lung, most severe in the left upper lobe but with additional areas of nodularity and irregular nodular septal thickening throughout the left lower lobe, progressive compared to the prior examination. Much of this in the left upper lobe likely represents tumor with additional postobstructive changes, and evolving lymphangitic spread of disease throughout the entirety of the left lung. Small left pleural effusion predominantly subpulmonic and in the dependent portion of the base of the left hemithorax. No right pleural effusion. Upper Abdomen: Soft tissue  surrounding the aorta in the upper abdomen, presumably lymphadenopathy, poorly evaluated on today's arterial phase examination. Musculoskeletal: Large infiltrative lytic lesion in the right scapula centered in the region of the glenoid. Review of the MIP images confirms the above findings. IMPRESSION: 1. No evidence of pulmonary embolism. 2. Progressive thoracic malignancy with increasing mass-like consolidation of the left upper lobe, progressive lymphangitic spread of tumor throughout the left lung, enlarging malignant left pleural effusion, enlarging metastatic pulmonary nodules in the right lung, lymphadenopathy in the left supraclavicular, retroperitoneal, mediastinal and bilateral hilar nodal stations, and osseous metastatic disease in the right scapula, as above. 3. Aortic atherosclerosis, in addition to left main and three-vessel coronary artery disease. Aortic Atherosclerosis (ICD10-I70.0). Electronically Signed   By: DVinnie LangtonM.D.   On: 03/15/2022 05:32   DG Chest 2 View  Result Date: 03/15/2022 CLINICAL DATA:  Chest pain and increasing shortness of breath, history of lung carcinoma EXAM: CHEST - 2 VIEW COMPARISON:  02/26/2022 PET CT, plain film from 02/06/2022 FINDINGS: Persistent masslike consolidation is noted apex of the left lung with increasing pleural based density laterally when compared with the prior plain film examination. There is also likely increasing effusion present as well as neoplastic involvement in the non consolidated lung. Right lung is mildly hyperinflated. The erosive changes in the right scapula are again identified but better visualized on prior PET-CT. IMPRESSION: Progressive consolidation involving the left lung. The majority of this is related to the known neoplasm although a superimposed more acute process such as pneumonia may be present as well given the abrupt onset. Right lung remains clear. Electronically Signed   By: MInez CatalinaM.D.   On: 03/15/2022  02:26     Medications: I have reviewed the patient's current medications.   Assessment/Plan: This is a very pleasant 62 years old African-American male with history of HIV and recently diagnosed stage IV non-small cell lung cancer, adenocarcinoma in September 2023 with the molecular studies showed an MET amplification and BRCA2 mutation but he has PD-L1 expression of 93%.  The patient status post palliative radiotherapy to the metastatic bone lesions under the care of Dr. Sondra Come. He started the first cycle of treatment with single agent immunotherapy with Libtayo (Cempilimab) on March 13, 2022.  The patient presented to the emergency department with NSTEMI which could be secondary to multiple factors including the palliative radiotherapy and also the possibility of the recent treatment with immunotherapy. I do not think the patient will be a good candidate for any aggressive measurement taken into consideration all his comorbidities and the significant metastatic lung cancer. I would recommend conservative management for his NSTEMI. He had not progressed on the immunotherapy since it started only 2 days ago. I would recommend for the patient to continue future treatment with immunotherapy with close monitoring. It is reasonable to have a discussion with the palliative care team regarding the goals of care. Thank you for taking good care of Mr. Steven Johnston, I will continue to follow-up the patient with you and assist in his management on as-needed basis. Disclaimer: This note was dictated with voice recognition software. Similar sounding words can inadvertently be transcribed and may be missed upon review. Eilleen Kempf, MD   LOS: 0 days    Eilleen Kempf 03/15/2022

## 2022-03-15 NOTE — Progress Notes (Signed)
Asked regarding utility of thora trial for patient. Reviewed CT, most of his left lung is tumor tissue and unlikely to respond to evacuation. Unfortunately I think only option here is hospice. Please reach out if any questions or concerns.  Erskine Emery MD PCCM

## 2022-03-15 NOTE — ED Provider Notes (Signed)
[facility] EMERGENCY DEPARTMENT Provider Note   CSN: [id number] Arrival date & time: 03/15/22  0122     History  Chief Complaint  Patient presents with   Chest Pain    Steven Johnston is a 61 y.o. male.  The history is provided by the patient and medical records.  Chest Pain  61 year old male with history of HIV, nonsmall cell lung cancer currently undergoing palliative chemo and radiation (last treatment Thursday 03/13/22) presenting to the ED with chest pain and shortness of breath.  States he was lying in bed last night around 10:30 PM and developed pressure in his central chest.  States he toss and turn for about 2 hours and could not get comfortable so decided to call EMS.  He was given 325 aspirin and 1 sublingual nitroglycerin by EMS which helped with his pain.  He denies any prior cardiac history.  He has never seen cardiology.  He denies any recent fever, chills, increased weight loss, vomiting, or diarrhea.  Home Medications Prior to Admission medications   Medication Sig Start Date End Date Taking? Authorizing Provider  albuterol (VENTOLIN HFA) 108 (90 Base) MCG/ACT inhaler Inhale 2 puffs into the lungs every 6 (six) hours as needed for wheezing or shortness of breath. 02/25/22   Pickenpack-Cousar, Carlena Sax, NP  benzonatate (TESSALON) 100 MG capsule Take 2 capsules (200 mg total) by mouth 3 (three) times daily as needed for cough. 02/25/22   Pickenpack-Cousar, Carlena Sax, NP  bictegravir-emtricitabine-tenofovir AF (BIKTARVY) 50-200-25 MG TABS tablet Take 1 tablet by mouth daily. 01/17/22   Rosiland Oz, MD  diclofenac Sodium (VOLTAREN) 1 % GEL Apply 2 g topically 4 (four) times daily. 02/07/22   Elodia Florence., MD  mirtazapine (REMERON) 7.5 MG tablet Take 1 tablet (7.5 mg total) by mouth at bedtime. 03/06/22   Heilingoetter, Cassandra L, PA-C  nicotine (NICODERM CQ - DOSED IN MG/24 HOURS) 14 mg/24hr patch Place 1 patch (14 mg total) onto the skin  daily. 02/08/22   Elodia Florence., MD  oxyCODONE (ROXICODONE) 5 MG immediate release tablet Take 1 tablet (5 mg total) by mouth every 6 (six) hours as needed. 03/12/22 03/12/23  Pickenpack-Cousar, Carlena Sax, NP  prochlorperazine (COMPAZINE) 10 MG tablet Take 1 tablet (10 mg total) by mouth every 6 (six) hours as needed. 03/06/22   Heilingoetter, Cassandra L, PA-C      Allergies    Patient has no known allergies.    Review of Systems   Review of Systems  Cardiovascular:  Positive for chest pain.  All other systems reviewed and are negative.   Physical Exam Updated Vital Signs BP 105/73   Pulse 95   Temp 98.1 F (36.7 C) (Oral)   Resp (!) 24   SpO2 94%   Physical Exam Vitals and nursing note reviewed.  Constitutional:      Appearance: He is well-developed.     Comments: Thin, chronically ill appearing  HENT:     Head: Normocephalic and atraumatic.  Eyes:     Conjunctiva/sclera: Conjunctivae normal.     Pupils: Pupils are equal, round, and reactive to light.  Cardiovascular:     Rate and Rhythm: Normal rate and regular rhythm.     Heart sounds: Normal heart sounds.  Pulmonary:     Effort: Pulmonary effort is normal. No tachypnea.     Breath sounds: No stridor. Examination of the left-upper field reveals decreased breath sounds. Examination of the left-middle field reveals decreased breath sounds.  Examination of the left-lower field reveals decreased breath sounds. Decreased breath sounds present.     Comments: Decreased breath sounds on left Abdominal:     General: Bowel sounds are normal.     Palpations: Abdomen is soft.  Musculoskeletal:        General: Normal range of motion.     Cervical back: Normal range of motion.  Skin:    General: Skin is warm and dry.  Neurological:     Mental Status: He is alert and oriented to person, place, and time.     ED Results / Procedures / Treatments   Labs (all labs ordered are listed, but only abnormal results are  displayed) Labs Reviewed  BASIC METABOLIC PANEL - Abnormal; Notable for the following components:      Result Value   Glucose, Bld 121 (*)    Calcium 8.2 (*)    All other components within normal limits  CBC - Abnormal; Notable for the following components:   WBC 12.4 (*)    Hemoglobin 11.1 (*)    HCT 36.0 (*)    MCV 79.8 (*)    MCH 24.6 (*)    Platelets 539 (*)    All other components within normal limits  TROPONIN I (HIGH SENSITIVITY) - Abnormal; Notable for the following components:   Troponin I (High Sensitivity) 1,212 (*)    All other components within normal limits  TROPONIN I (HIGH SENSITIVITY) - Abnormal; Notable for the following components:   Troponin I (High Sensitivity) 2,469 (*)    All other components within normal limits  HEPARIN LEVEL (UNFRACTIONATED)    EKG None  Radiology CT Angio Chest PE W and/or Wo Contrast  Result Date: 03/15/2022 CLINICAL DATA:  61 year old male with central chest pain and shortness of breath this evening. History of lung cancer, currently undergoing chemotherapy. EXAM: CT ANGIOGRAPHY CHEST WITH CONTRAST TECHNIQUE: Multidetector CT imaging of the chest was performed using the standard protocol during bolus administration of intravenous contrast. Multiplanar CT image reconstructions and MIPs were obtained to evaluate the vascular anatomy. RADIATION DOSE REDUCTION: This exam was performed according to the departmental dose-optimization program which includes automated exposure control, adjustment of the mA and/or kV according to patient size and/or use of iterative reconstruction technique. CONTRAST:  53mL OMNIPAQUE IOHEXOL 350 MG/ML SOLN COMPARISON:  PET-CT 02/26/2022.  Chest CT 02/05/2022. FINDINGS: Cardiovascular: No filling defects within the pulmonary arterial tree to suggest pulmonary embolism. Heart size is normal. No definite pericardial fluid or thickening. No pericardial calcification. Atherosclerotic calcifications are noted in the left  main, left anterior descending, left circumflex and right coronary arteries. Mediastinum/Nodes: Numerous borderline enlarged and enlarged mediastinal and bilateral hilar lymph nodes are noted measuring up to 1.7 cm in the low right paratracheal nodal station and 1.8 cm in the right hilar region. Bulky soft tissues also noted in the left hilar region, contiguous with adjacent abnormal soft tissue along, difficult to accurately measure. Esophagus is unremarkable in appearance. No axillary lymphadenopathy. Left-sided supraclavicular lymphadenopathy measuring up to 1.6 cm in short axis (axial image 26 of series 5). Lungs/Pleura: Increasingly apparent nodular opacities in the base of the right lung measuring up to 2.8 x 1.7 cm (axial image 149 of series 6), likely progressive metastatic lesions. Multiple other smaller pulmonary nodules scattered elsewhere in the right lung. Extensive mass-like consolidation throughout the left lung, most severe in the left upper lobe but with additional areas of nodularity and irregular nodular septal thickening throughout the left lower lobe, progressive  compared to the prior examination. Much of this in the left upper lobe likely represents tumor with additional postobstructive changes, and evolving lymphangitic spread of disease throughout the entirety of the left lung. Small left pleural effusion predominantly subpulmonic and in the dependent portion of the base of the left hemithorax. No right pleural effusion. Upper Abdomen: Soft tissue surrounding the aorta in the upper abdomen, presumably lymphadenopathy, poorly evaluated on today's arterial phase examination. Musculoskeletal: Large infiltrative lytic lesion in the right scapula centered in the region of the glenoid. Review of the MIP images confirms the above findings. IMPRESSION: 1. No evidence of pulmonary embolism. 2. Progressive thoracic malignancy with increasing mass-like consolidation of the left upper lobe, progressive  lymphangitic spread of tumor throughout the left lung, enlarging malignant left pleural effusion, enlarging metastatic pulmonary nodules in the right lung, lymphadenopathy in the left supraclavicular, retroperitoneal, mediastinal and bilateral hilar nodal stations, and osseous metastatic disease in the right scapula, as above. 3. Aortic atherosclerosis, in addition to left main and three-vessel coronary artery disease. Aortic Atherosclerosis (ICD10-I70.0). Electronically Signed   By: Vinnie Langton M.D.   On: 03/15/2022 05:32   DG Chest 2 View  Result Date: 03/15/2022 CLINICAL DATA:  Chest pain and increasing shortness of breath, history of lung carcinoma EXAM: CHEST - 2 VIEW COMPARISON:  02/26/2022 PET CT, plain film from 02/06/2022 FINDINGS: Persistent masslike consolidation is noted apex of the left lung with increasing pleural based density laterally when compared with the prior plain film examination. There is also likely increasing effusion present as well as neoplastic involvement in the non consolidated lung. Right lung is mildly hyperinflated. The erosive changes in the right scapula are again identified but better visualized on prior PET-CT. IMPRESSION: Progressive consolidation involving the left lung. The majority of this is related to the known neoplasm although a superimposed more acute process such as pneumonia may be present as well given the abrupt onset. Right lung remains clear. Electronically Signed   By: Inez Catalina M.D.   On: 03/15/2022 02:26    Procedures Procedures    CRITICAL CARE Performed by: Larene Pickett   Total critical care time: 45 minutes  Critical care time was exclusive of separately billable procedures and treating other patients.  Critical care was necessary to treat or prevent imminent or life-threatening deterioration.  Critical care was time spent personally by me on the following activities: development of treatment plan with patient and/or surrogate  as well as nursing, discussions with consultants, evaluation of patient's response to treatment, examination of patient, obtaining history from patient or surrogate, ordering and performing treatments and interventions, ordering and review of laboratory studies, ordering and review of radiographic studies, pulse oximetry and re-evaluation of patient's condition.   Medications Ordered in ED Medications  heparin ADULT infusion 100 units/mL (25000 units/253mL) (650 Units/hr Intravenous New Bag/Given 03/15/22 0343)  heparin bolus via infusion 3,000 Units (3,000 Units Intravenous Bolus from Bag 03/15/22 0343)  iohexol (OMNIPAQUE) 350 MG/ML injection 75 mL (75 mLs Intravenous Contrast Given 03/15/22 0503)    ED Course/ Medical Decision Making/ A&P                           Medical Decision Making Amount and/or Complexity of Data Reviewed Labs: ordered. Radiology: ordered and independent interpretation performed. ECG/medicine tests: ordered and independent interpretation performed.  Risk Prescription drug management. Decision regarding hospitalization.   61 year old male presenting to the ED for chest pain and SOB  that began last night around 10:30PM-- central chest pressure with SOB.  Known hx of lung cancer, currently receiving palliative radiation and chemo (last treatment 03/13/22).  EKG with subtle changes but no ST elevation.  Initial troponin elevated at 1212.  Leukocytosis of 12,000.  Denies any fever or other infectious symptoms.  No significant electrolyte derangement.  Chest x-ray with worsening tumor burden vs possible superimposed process.  Given hx of CA, PE is consideration.  No pain in the back or abdomen concerning for dissection.  He is HD stable currently.  Will get CTA chest for further evaluation.  Will discuss with cardiology.  5:03 AM Delta troponin increased to 2469.   Spoke with cardiology, Dr. Terri Skains-- has reviewed chart.  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   He will consult and provide further recommendations.  CTA without findings of PE.  Does have worsening tumor burden as well as left pleural effusion. Patient reassessed-- he is resting comfortably at this time, denies pain.  VS remain stable on RA.  Will admit to medical service.  Discussed with Dr. Alcario Drought-- will admit for ongoing care.  Final Clinical Impression(s) / ED Diagnoses Final diagnoses:  NSTEMI (non-ST elevated myocardial infarction) [facility])    Rx / [place] Orders ED Discharge Orders     None         Larene Pickett, PA-C 03/15/22 [id number]    Orpah Greek, MD 03/15/22 [phone]

## 2022-03-15 NOTE — ED Provider Triage Note (Signed)
  Emergency Medicine Provider Triage Evaluation Note  MRN:  [id number]  Arrival date & time: 03/15/22    Medically screening exam initiated at 1:46 AM.   CC:   Chest Pain   HPI:  Steven Johnston is a 61 y.o. year-old male presents to the ED with chief complaint of chest pain, onset tonight around 9 or 10 PM.  Has history of HIV and lung cancer.  States that he is still having some pain now.  Denies fevers or chills.  Denies any other associated symptoms.  History provided by patient. ROS:  -As included in HPI PE:   Vitals:   03/15/22 0126  BP: 112/73  Pulse: (!) 101  Resp: 18  Temp: 98.1 F (36.7 C)  SpO2: 94%    Non-toxic appearing No respiratory distress Chronically ill appearing tachycardic MDM:  Based on signs and symptoms, ACS, PE, CAP is highest on my differential. I've ordered labs and imaging in triage to expedite lab/diagnostic workup.  Patient was informed that the remainder of the evaluation will be completed by another provider, this initial triage assessment does not replace that evaluation, and the importance of remaining in the ED until their evaluation is complete.    [name], PA-C 03/15/22 0147

## 2022-03-15 NOTE — Consult Note (Signed)
Consultation Note Date: 03/15/2022   Patient Name: Steven Johnston  DOB: 1961-05-19  MRN: [id number]  Age / Sex: 61 y.o., male  PCP: Patient, No Pcp Per Referring Physician: Norval Morton, MD  Reason for Consultation: Establishing goals of care  HPI/Patient Profile: 61 y.o. 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   Patient had just recently been hospitalized 9/6-9/8 with complaints of worsening cough and right shoulder pain.  Found to  metastatic disease with biopsy of the left supraclavicular noduleconglomeration bx showing non small cell carcinoma.  Patient now admitted with NSTEMI and CT angiogram of the chest obtained noting progressive malignancy, enlarging malignant left pleural effusion.  PMT has been consulted to assist with goals of care conversation.  Clinical Assessment and Goals of Care:  I have reviewed medical records including EPIC notes, labs and imaging, received report from RN, assessed the patient and met at the bedside to discuss diagnosis prognosis, GOC, EOL wishes, disposition and options.  I introduced Palliative Medicine as specialized medical care for people living with serious illness. It focuses on providing relief from the symptoms and stress of a serious illness. The goal is to improve quality of life for both the patient and the family.  We discussed a brief life review of the patient and then focused on their current illness.   I attempted to elicit values and goals of care important to the patient.    Medical History Review and Understanding:  Patient confirms he has just had his first dose of immunotherapy treatment a couple days ago.  He seems to understand the  severity of his illness, alluding to the fact that "there is not much you can do."  Social History: Patient lives with his mom.  He has a son who is 18 and a daughter who is 39.  Functional and Nutritional State: Patient reports he is independent with all ADLs.  Ambulates without assistive device although fatigues with short distances.  Palliative Symptoms: Dyspnea on exertion, pain  Code Status: Concepts specific to code status, artifical feeding and hydration, and rehospitalization were considered and discussed.   Discussion: Patient's family is waiting outside at the time of our discussion and he declines their participation.  He tells me only his mother and son are ever involved and he wishes to just share updates with them later.  We discussed his NSTEMI and conversations with specialists thus far including oncology and cardiology.  He understands that his "mild heart attack" is likely related to his cancer and I provided education on his increased risk of clots to the heart, lungs, brain to consider.  Explored his thoughts and feelings on how to proceed if he were to decline further or have additional complications arise.  He does not want invasive procedures such as dialysis or admission to the ICU, but he is agreeable to IV medications and reasonably wants to treat the treatable.  He feels his current quality of life is not great but adequate.  His main goal is to see his daughter for her 65th birthday, which is on October 17.  His daughter stays with her sister and he tells me she lost her mother to cancer around 7 years ago.  Explored his coping mechanisms through this difficult time.  He states "I feel okay, there is no point being  stressed about it."  He is spiritual but does not want to talk much about his beliefs.  He would like for the medical team to "keep me going for as long as they can."   Discussed the importance of continued conversation with family and the medical providers  regarding overall plan of care and treatment options, ensuring decisions are within the context of the patient's values and GOCs.   Questions and concerns were addressed.  The patient was encouraged to call with questions or concerns.  PMT will continue to support holistically.   SUMMARY OF RECOMMENDATIONS   -CODE STATUS changed to DNR -Continue current care, no further escalation as patient desires to avoid ICU and invasive procedures/interventions -Goal is for continued immunotherapy with Dr. Earlie Server for as long as he can tolerate this, patient feels his current quality of life is adequate and hopes for more time with his daughter -Patient will likely continue following with outpatient palliative care in the cancer center as he is not yet ready for hospice -Psychosocial emotional support provided -PMT will continue to follow and support  Prognosis:  < 6 months would not be surprising, hospice appropriate when aligned with patient goals of care  Discharge Planning: Home with Palliative Services      Primary Diagnoses: Present on Admission:  NSTEMI (non-ST elevated myocardial infarction) ([place])  HIV disease ([place])  Thrombocytosis  Microcytic hypochromic anemia  Leukocytosis  Non-small cell lung cancer ([place])  (Resolved) Metastasis to bone ([place])  Metastatic disease ([place])  Acute respiratory failure with hypoxia ([place])  Physical Exam Vitals and nursing note reviewed.  Constitutional:      General: He is not in acute distress.    Appearance: He is cachectic. He is ill-appearing.  Cardiovascular:     Rate and Rhythm: Normal rate.  Pulmonary:     Effort: Tachypnea present.  Skin:    General: Skin is warm and dry.  Neurological:     Mental Status: He is alert and oriented to person, place, and time.  Psychiatric:        Behavior: Behavior is cooperative.    Vital Signs: BP 111/73   Pulse 95   Temp 98.2 F (36.8 C) (Oral)   Resp (!) 27   Ht 5' 10"  (1.778 m)   Wt 54.1 kg    SpO2 95%   BMI 17.11 kg/m  Pain Scale: 0-10   Pain Score: 10-Worst pain ever   SpO2: SpO2: 95 % O2 Device:SpO2: 95 % O2 Flow Rate: .O2 Flow Rate (L/min): 6 L/min  Palliative Assessment/Data: 60%    MDM: High    Yamilex Borgwardt Johnnette Litter, PA-C  Palliative Medicine Team Team phone # [phone]  Thank you for allowing the Palliative Medicine Team to assist in the care of this patient. Please utilize secure chat with additional questions, if there is no response within 30 minutes please call the above phone number.  Palliative Medicine Team providers are available by phone from 7am to 7pm daily and can be reached through the team cell phone.  Should this patient require assistance outside of these hours, please call the patient's attending physician.

## 2022-03-15 NOTE — Progress Notes (Signed)
ANTICOAGULATION CONSULT NOTE - Initial Consult  Pharmacy Consult for heparin Indication: chest pain/ACS  No Known Allergies  Patient Measurements: Height: 5\' 10"  (177.8 cm) Weight: 54.1 kg (119 lb 4.3 oz) IBW/kg (Calculated) : 73  Vital Signs: Temp: 98.1 F (36.7 C) (10/14 0126) Temp Source: Oral (10/14 0126) BP: 105/73 (10/14 0315) Pulse Rate: 95 (10/14 0315)  Labs: Recent Labs    03/13/22 1158 03/15/22 0153  HGB 11.7* 11.1*  HCT 37.7* 36.0*  PLT 656* 539*  CREATININE 0.64 0.82  TROPONINIHS  --  1,212*    Estimated Creatinine Clearance: 72.4 mL/min (by C-G formula based on SCr of 0.82 mg/dL).   Medical History: Past Medical History:  Diagnosis Date   Hernia, inguinal, right    HIV (human immunodeficiency virus infection) ([place])     Assessment: 61yo male c/o central CP associated with SOB, initial troponin found to be elevated >> to start heparin.  Goal of Therapy:  Heparin level 0.3-0.7 units/ml Monitor platelets by anticoagulation protocol: Yes   Plan:  Heparin 3000 units IV bolus x1 followed by infusion at 650 units/hr. Monitor heparin levels and CBC.  Wynona Neat, PharmD, BCPS  03/15/2022,3:26 AM

## 2022-03-15 NOTE — ED Triage Notes (Signed)
Patient arrived with EMS from home reports central chest pain with SOB onset this evening , he received ASA 324 mg and 1 NTG sl by EMS prior to arrival . History of lung CA currently receiving chemotherapy .

## 2022-03-15 NOTE — Progress Notes (Signed)
ON-CALL CARDIOLOGY 03/15/22  Patient's name: Steven Johnston.   MRN: [id number].    DOB: 09-08-1960 Primary care provider: Patient, No Pcp Per. Primary cardiologist: NA  Interaction regarding this patient's care today: ED call to discuss his presentation / plan of care.  Comes in for CP starting at 1030 pm while in bed, central, pressure, non-exertional.  EMS called.  Pain improved after ASA and Nitro.  Initial EKG is not STEMI.  Troponin (first 2) are uptrending but no active pain.  Not on inotropic or vasopressor support.   Impression: NSTEMI Elevated Troponin Lung Cancer  HIV  Recommendations: Repeat EKG Recommended D-dimer, but patient is in que for CTA chest to rule out PE. Recommend dissection study as an alternative to rule acute pathology and may aid in knowing ? Cancer burden.  IV Heparin gtt per pharmacy for ACS protocol - if no contraindications,  Check UDS - hx of cocaine abuse  Check Echo  Admit to medicine & cards to consult (formal pending).  Due to complex hx of HIV and Lung Cancer on going treatment DDX also includes but not limited to ACS, acute vascular pathology, PE, myopericarditis, coronary vasospasm, or the possibility of opportunitist infection (given chest xray findings).  Will follow with you.    Rex Kras, [place], [facility]  Pager: [phone] Office: [phone]

## 2022-03-15 NOTE — H&P (Signed)
History and Physical    Patient: Steven Johnston XIP:[id number] DOB: 1960-09-17 DOA: 03/15/2022 DOS: the patient was seen and examined on 03/15/2022 PCP: Patient, No Pcp Per  Patient coming from: Via EMS  Chief Complaint:  Chief Complaint  Patient presents with   Chest Pain   HPI: Steven Johnston is a 61 y.o. 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  Pain was described as pressure.  He had just completed his last radiation therapy on 10/11 and had received his first infusion of Libtayo on 10/12.  He has had a chronic cough that he feels is unchange.  Denies having any significant fever or chills.  He has not had much of an appetite and feels like he is lost some more weight but has not recently checked.  Patient reports that even using the bathroom makes him extremely short of breath where he has to rest. Patient had been given full dose aspirin and 1 sublingual nitroglycerin EMS which helped some.   Patient had just recently been hospitalized 9/6-9/8 with complaints of worsening cough and right shoulder pain.  Found to  metastatic disease with biopsy of the left supraclavicular noduleconglomeration bx showing non small cell carcinoma.  He had initially been started on antibiotics for pneumonia, but these were discontinued as symptoms were thought to be noninfectious in nature.  On admission into the emergency department patient was noted to be afebrile with tachycardia, tachypnea, blood pressures maintained, and O2 saturations as low as 87% on 2 L of oxygen with improvement to greater than 92% once increased to 6 L.  Labs significant for WBC 12.4, hemoglobin 11.1, platelets 539, calcium 8.2, and high-sensitivity troponin 1212->2469.   Cardiology had been consulted and felt not a STEMI after reviewing EKG. CT angiogram of the chest was obtained noting progressive thoracic malignancy with increasing masslike consolidation in the left upper lobe, progressive lymphogenic spread of tumor throughout the left lung, enlarging malignant left pleural effusion, enlarging metastatic pulmonary nodules right lung, and osseous metastatic disease to the right scapula.  Dr. Terri Skains of cardiology had been consulted.  Patient was started on heparin drip.   Review of Systems: As mentioned in the history of present illness. All other systems reviewed and are negative. Past Medical History:  Diagnosis Date   Hernia, inguinal, right    HIV (human immunodeficiency virus infection) ([place])    Past Surgical History:  Procedure Laterality Date   BRONCHIAL WASHINGS  10/05/2020   Procedure: BRONCHIAL WASHINGS;  Surgeon: Julian Hy, DO;  Location: [facility];  Service: Endoscopy;;   INGUINAL HERNIA REPAIR Right 02/17/2018   Procedure: OPEN HERNIA REPAIR RIGHT INGUINAL INCARCERATED WITH DIAGONSTIC LAPAROSCOPY FOR INCARCERATED HERNIA;  Surgeon: Ileana Roup, MD;  Location: [facility];  Service: General;  Laterality: Right;   VIDEO BRONCHOSCOPY N/A 10/05/2020   Procedure: VIDEO BRONCHOSCOPY WITHOUT FLUORO;  Surgeon: Julian Hy, DO;  Location: [facility];  Service: Endoscopy;  Laterality: N/A;   Social History:  reports that he has quit smoking. His smoking use included cigarettes. He smoked an average of .3 packs per day. He has never used smokeless tobacco. He reports current alcohol use. He reports that he does not use drugs.  No Known Allergies  Family History  Problem Relation Age of Onset   Pancreatic cancer  Brother    Immunodeficiency Neg Hx     Prior to Admission medications   Medication Sig Start Date End Date Taking? Authorizing Provider  albuterol (VENTOLIN HFA) 108 (90 Base) MCG/ACT inhaler Inhale 2 puffs into the lungs every 6  (six) hours as needed for wheezing or shortness of breath. 02/25/22   Pickenpack-Cousar, Carlena Sax, NP  benzonatate (TESSALON) 100 MG capsule Take 2 capsules (200 mg total) by mouth 3 (three) times daily as needed for cough. 02/25/22   Pickenpack-Cousar, Carlena Sax, NP  bictegravir-emtricitabine-tenofovir AF (BIKTARVY) 50-200-25 MG TABS tablet Take 1 tablet by mouth daily. 01/17/22   Rosiland Oz, MD  diclofenac Sodium (VOLTAREN) 1 % GEL Apply 2 g topically 4 (four) times daily. 02/07/22   Elodia Florence., MD  mirtazapine (REMERON) 7.5 MG tablet Take 1 tablet (7.5 mg total) by mouth at bedtime. 03/06/22   Heilingoetter, Cassandra L, PA-C  nicotine (NICODERM CQ - DOSED IN MG/24 HOURS) 14 mg/24hr patch Place 1 patch (14 mg total) onto the skin daily. 02/08/22   Elodia Florence., MD  oxyCODONE (ROXICODONE) 5 MG immediate release tablet Take 1 tablet (5 mg total) by mouth every 6 (six) hours as needed. 03/12/22 03/12/23  Pickenpack-Cousar, Carlena Sax, NP  prochlorperazine (COMPAZINE) 10 MG tablet Take 1 tablet (10 mg total) by mouth every 6 (six) hours as needed. 03/06/22   Heilingoetter, Cassandra L, PA-C    Physical Exam: Vitals:   03/15/22 0645 03/15/22 0651 03/15/22 0715 03/15/22 0745  BP: 105/74  111/76 114/75  Pulse: 84 93 90 89  Resp: (!) 23 (!) 27 (!) 24 (!) 21  Temp:      TempSrc:      SpO2:  (!) 87% 92% 94%  Weight:      Height:       Constitutional: Cachectic older male who appears to be in some discomfort Eyes: PERRL, lids and conjunctivae normal ENMT: Mucous membranes are dry.  Poor dentition. Neck: normal, supple  Respiratory: Decreased over aeration most notably on the left lung field with intermittent scattered wheezes  Cardiovascular: Regular rate and rhythm, no murmurs / rubs / gallops. No extremity edema. 2+ pedal pulses.   Abdomen: no tenderness, no masses palpated.  Bowel sounds positive.  Musculoskeletal: no clubbing / cyanosis.  Muscle wasting noted of the upper  and lower extremities. Skin: no rashes, lesions, ulcers. No induration Neurologic: CN 2-12 grossly intact. S Strength 5/5 in all 4.  Psychiatric: Normal judgment and insight. Alert and oriented x 3. Normal mood.   Data Reviewed:  EKG revealed normal sinus rhythm at 99 bpm with incomplete right bundle branch block and nonspecific ST-T wave changes.  Assessment and Plan: NSTEMI Presents with complaints of centralized chest pain.  EKG was felt not to represent a STEMI, but high-sensitivity troponins 1212-> 2469-> 6234.  Patient had been placed on a heparin drip pharmacy. -Admit to a telemetry bed -Continue heparin drip per pharmacy -Morphine IV as needed for severe pain -Follow-up echocardiogram -Appreciate cardiology consultative services, we will follow-up for any further recommendations  Acute respiratory failure with hypoxia On admission O2 saturation noted to be as low as 80% with improvement on 6 L nasal cannula oxygen.  Related with enlarging left-sided pleural effusion. -Continuous pulse oximetry with nasal cannula oxygen maintain O2 saturation greater than 92%. -Breathing treatments as needed -Discussed pleural effusion with Dr. Erskine Emery of PCCM who felt there would be limited benefit with thoracentesis in the setting.  Metastatic non-small cell  lung cancer with involvement  bone  and brain with malignant pleural effusion Patient with stage IV non-small cell cancer that shows progressive disease on CT imaging with enlarging left pleural effusion.  He had been receiving palliative radiation and chemotherapy.  Patient was noted to have enhancing lesions on the left frontal lobe and right posterior frontal lobe with associated restricted diffusion and edema concerning for metastatic disease with other small foci concerning for additional small metastases. -Dr. Earlie Server of oncology consulted and recommended conservative management -Palliative care consulted for goals of  care  Leukocytosis Acute.  WBC elevated up to 12.4 similar to prior. -Check procalcitonin  Microcytic hypochromic anemia Chronic.  Hemoglobin 11.1 g/dL which appears notably similar to patient's baseline which ranges from 11-12 recently. -Continue to monitor  HIV Home medication regimen includes Biktarvy. -Continue Biktarvy   Thrombocytosis Chronic.  Platelet count 539 similar to priors. -Continue to monitor  Advance Care Planning:   Code Status: Full Code   Consults: Cardiology, oncology, and palliative care  Family Communication: Mother updated over the phone  Severity of Illness: The appropriate patient status for this patient is OBSERVATION. Observation status is judged to be reasonable and necessary in order to provide the required intensity of service to ensure the patient's safety. The patient's presenting symptoms, physical exam findings, and initial radiographic and laboratory data in the context of their medical condition is felt to place them at decreased risk for further clinical deterioration. Furthermore, it is anticipated that the patient will be medically stable for discharge from the hospital within 2 midnights of admission.   Author: Norval Morton, MD 03/15/2022 8:02 AM  For on call review [email or web address].

## 2022-03-15 NOTE — Progress Notes (Signed)
  Echocardiogram 2D Echocardiogram with contrast has been performed.  Merrie Roof F 03/15/2022, 9:59 AM

## 2022-03-16 DIAGNOSIS — J910 Malignant pleural effusion: Secondary | ICD-10-CM | POA: Diagnosis present

## 2022-03-16 DIAGNOSIS — C3492 Malignant neoplasm of unspecified part of left bronchus or lung: Secondary | ICD-10-CM | POA: Diagnosis not present

## 2022-03-16 DIAGNOSIS — R7989 Other specified abnormal findings of blood chemistry: Secondary | ICD-10-CM

## 2022-03-16 DIAGNOSIS — C3482 Malignant neoplasm of overlapping sites of left bronchus and lung: Secondary | ICD-10-CM | POA: Diagnosis present

## 2022-03-16 DIAGNOSIS — F1490 Cocaine use, unspecified, uncomplicated: Secondary | ICD-10-CM

## 2022-03-16 DIAGNOSIS — C772 Secondary and unspecified malignant neoplasm of intra-abdominal lymph nodes: Secondary | ICD-10-CM | POA: Diagnosis present

## 2022-03-16 DIAGNOSIS — I2119 ST elevation (STEMI) myocardial infarction involving other coronary artery of inferior wall: Secondary | ICD-10-CM | POA: Diagnosis present

## 2022-03-16 DIAGNOSIS — R64 Cachexia: Secondary | ICD-10-CM | POA: Diagnosis present

## 2022-03-16 DIAGNOSIS — J9601 Acute respiratory failure with hypoxia: Secondary | ICD-10-CM | POA: Diagnosis present

## 2022-03-16 DIAGNOSIS — D509 Iron deficiency anemia, unspecified: Secondary | ICD-10-CM | POA: Diagnosis present

## 2022-03-16 DIAGNOSIS — Z66 Do not resuscitate: Secondary | ICD-10-CM | POA: Diagnosis present

## 2022-03-16 DIAGNOSIS — R0602 Shortness of breath: Secondary | ICD-10-CM

## 2022-03-16 DIAGNOSIS — Z681 Body mass index (BMI) 19 or less, adult: Secondary | ICD-10-CM | POA: Diagnosis not present

## 2022-03-16 DIAGNOSIS — I2584 Coronary atherosclerosis due to calcified coronary lesion: Secondary | ICD-10-CM | POA: Diagnosis not present

## 2022-03-16 DIAGNOSIS — Z515 Encounter for palliative care: Secondary | ICD-10-CM | POA: Diagnosis not present

## 2022-03-16 DIAGNOSIS — J811 Chronic pulmonary edema: Secondary | ICD-10-CM | POA: Diagnosis present

## 2022-03-16 DIAGNOSIS — I10 Essential (primary) hypertension: Secondary | ICD-10-CM | POA: Diagnosis present

## 2022-03-16 DIAGNOSIS — I700 Atherosclerosis of aorta: Secondary | ICD-10-CM

## 2022-03-16 DIAGNOSIS — I3139 Other pericardial effusion (noninflammatory): Secondary | ICD-10-CM | POA: Diagnosis present

## 2022-03-16 DIAGNOSIS — R072 Precordial pain: Secondary | ICD-10-CM

## 2022-03-16 DIAGNOSIS — I2510 Atherosclerotic heart disease of native coronary artery without angina pectoris: Secondary | ICD-10-CM

## 2022-03-16 DIAGNOSIS — Z23 Encounter for immunization: Secondary | ICD-10-CM | POA: Diagnosis not present

## 2022-03-16 DIAGNOSIS — C771 Secondary and unspecified malignant neoplasm of intrathoracic lymph nodes: Secondary | ICD-10-CM | POA: Diagnosis present

## 2022-03-16 DIAGNOSIS — B1910 Unspecified viral hepatitis B without hepatic coma: Secondary | ICD-10-CM | POA: Diagnosis present

## 2022-03-16 DIAGNOSIS — L89152 Pressure ulcer of sacral region, stage 2: Secondary | ICD-10-CM | POA: Diagnosis present

## 2022-03-16 DIAGNOSIS — C7931 Secondary malignant neoplasm of brain: Secondary | ICD-10-CM | POA: Diagnosis present

## 2022-03-16 DIAGNOSIS — E43 Unspecified severe protein-calorie malnutrition: Secondary | ICD-10-CM | POA: Diagnosis present

## 2022-03-16 DIAGNOSIS — I959 Hypotension, unspecified: Secondary | ICD-10-CM | POA: Diagnosis not present

## 2022-03-16 DIAGNOSIS — C3490 Malignant neoplasm of unspecified part of unspecified bronchus or lung: Secondary | ICD-10-CM | POA: Diagnosis not present

## 2022-03-16 DIAGNOSIS — I214 Non-ST elevation (NSTEMI) myocardial infarction: Secondary | ICD-10-CM | POA: Diagnosis present

## 2022-03-16 DIAGNOSIS — C7951 Secondary malignant neoplasm of bone: Secondary | ICD-10-CM | POA: Diagnosis present

## 2022-03-16 DIAGNOSIS — B20 Human immunodeficiency virus [HIV] disease: Secondary | ICD-10-CM | POA: Diagnosis present

## 2022-03-16 DIAGNOSIS — L8990 Pressure ulcer of unspecified site, unspecified stage: Secondary | ICD-10-CM | POA: Insufficient documentation

## 2022-03-16 DIAGNOSIS — C7801 Secondary malignant neoplasm of right lung: Secondary | ICD-10-CM | POA: Diagnosis present

## 2022-03-16 LAB — BASIC METABOLIC PANEL
Anion gap: 9 (ref 5–15)
BUN: 11 mg/dL (ref 8–23)
CO2: 27 mmol/L (ref 22–32)
Calcium: 8 mg/dL — ABNORMAL LOW (ref 8.9–10.3)
Chloride: 102 mmol/L (ref 98–111)
Creatinine, Ser: 0.85 mg/dL (ref 0.61–1.24)
GFR, Estimated: >60 mL/min (ref >60)
Glucose, Bld: 124 mg/dL — ABNORMAL HIGH (ref 70–99)
Potassium: 3.6 mmol/L (ref 3.5–5.1)
Sodium: 138 mmol/L (ref 135–145)

## 2022-03-16 LAB — CBC
HCT: 32.1 % — ABNORMAL LOW (ref 39.0–52.0)
Hemoglobin: 10.3 g/dL — ABNORMAL LOW (ref 13.0–17.0)
MCH: 24.6 pg — ABNORMAL LOW (ref 26.0–34.0)
MCHC: 32.1 g/dL (ref 30.0–36.0)
MCV: 76.8 fL — ABNORMAL LOW (ref 80.0–100.0)
Platelets: 543 10*3/uL — ABNORMAL HIGH (ref 150–400)
RBC: 4.18 MIL/uL — ABNORMAL LOW (ref 4.22–5.81)
RDW: 14.5 % (ref 11.5–15.5)
WBC: 10.7 10*3/uL — ABNORMAL HIGH (ref 4.0–10.5)
nRBC: 0 % (ref 0.0–0.2)

## 2022-03-16 MED ORDER — ENOXAPARIN SODIUM 60 MG/0.6ML IJ SOSY
50.0000 mg | PREFILLED_SYRINGE | Freq: Two times a day (BID) | INTRAMUSCULAR | Status: DC
  Administered 2022-03-16 – 2022-03-17 (×2): 50 mg via SUBCUTANEOUS
  Filled 2022-03-16 (×2): qty 0.6

## 2022-03-16 MED ORDER — ENOXAPARIN SODIUM 40 MG/0.4ML IJ SOSY
40.0000 mg | PREFILLED_SYRINGE | INTRAMUSCULAR | Status: DC
  Administered 2022-03-18 – 2022-03-27 (×10): 40 mg via SUBCUTANEOUS
  Filled 2022-03-16 (×10): qty 0.4

## 2022-03-16 MED ORDER — TORSEMIDE 20 MG PO TABS
10.0000 mg | ORAL_TABLET | ORAL | Status: DC
  Administered 2022-03-16 – 2022-03-22 (×4): 10 mg via ORAL
  Filled 2022-03-16 (×5): qty 1

## 2022-03-16 MED ORDER — DILTIAZEM HCL 30 MG PO TABS
30.0000 mg | ORAL_TABLET | Freq: Four times a day (QID) | ORAL | Status: DC
  Administered 2022-03-16 – 2022-03-21 (×14): 30 mg via ORAL
  Filled 2022-03-16 (×17): qty 1

## 2022-03-16 NOTE — Progress Notes (Addendum)
ANTICOAGULATION CONSULT NOTE   Pharmacy Consult for heparin>>lovenox Indication: chest pain/ACS  No Known Allergies  Patient Measurements: Height: 5\' 10"  (177.8 cm) Weight: 50.8 kg (112 lb) IBW/kg (Calculated) : 73  Vital Signs: Temp: 97.9 F (36.6 C) (10/15 1620) Temp Source: Oral (10/15 1620) BP: 108/72 (10/15 1620) Pulse Rate: 101 (10/15 1620)  Labs: Recent Labs    03/15/22 0153 03/15/22 0322 03/15/22 0805 03/15/22 1014 03/15/22 1019 03/16/22 0044  HGB 11.1*  --   --   --   --  10.3*  HCT 36.0*  --   --   --   --  32.1*  PLT 539*  --   --   --   --  543*  HEPARINUNFRC  --   --   --   --  <0.10*  --   CREATININE 0.82  --   --   --   --  0.85  TROPONINIHS 1,212* 2,469* 6,234* 3,377*  --   --      Estimated Creatinine Clearance: 65.6 mL/min (by C-G formula based on SCr of 0.85 mg/dL).   Medical History: Past Medical History:  Diagnosis Date   Hernia, inguinal, right    HIV (human immunodeficiency virus infection) ([place])     Assessment: 61yo male c/o central CP associated with SOB, initial troponin found to be elevated >> to start heparin.  Initial heparin level undetectable on 650 units/hr, rate confirmed and no infusion issues.  CT angio chest negative for PE  Conservative management for ACS per onc. Converted to SQ Lovenox yesterday. Wt decreased to ~51kg. We will adjust lovenox today.   Addendum  Ok to stop full dose Lovenox after 48 hrs and change to DVT prophylaxis dosing per Dr Terri Skains.  Goal of Therapy:  Anti-Xa - 0.6-1 Monitor platelets by anticoagulation protocol: Yes   Plan:  Decrease Lovenox 50mg  SQ BID - stop 10/16 AM Lovenox 40mg  SQ qday start 10/17 Rx signs off  Onnie Boer, PharmD, [place], AAHIVP, CPP Infectious Disease Pharmacist 03/16/2022 4:50 PM

## 2022-03-16 NOTE — Progress Notes (Signed)
Progress Note  Patient Name: Steven Johnston Date of Encounter: 03/16/2022  Attending physician: Mercy Riding, MD Primary care provider: Patient, No Pcp Per  Subjective: Steven Johnston is a 61 y.o. male who was seen and examined at bedside  Sitting upright eating lunch. Denies chest pain. Shortness of breath and coughing persist-but not progressive Case discussed and reviewed with his nurse.  Objective: Vital Signs in the last 24 hours: Temp:  [97.7 F (36.5 C)-98.2 F (36.8 C)] 97.9 F (36.6 C) (10/15 1620) Pulse Rate:  [42-100] 100 (10/15 1620) Resp:  [18-26] 26 (10/15 1620) BP: (103-112)/(62-72) 108/72 (10/15 1620) SpO2:  [90 %-97 %] 94 % (10/15 1620) Weight:  [50.8 kg] 50.8 kg (10/15 0213)  Intake/Output:  Intake/Output Summary (Last 24 hours) at 03/16/2022 1728 Last data filed at 03/16/2022 1300 Gross per 24 hour  Intake 240 ml  Output 2100 ml  Net -1860 ml    Net IO Since Admission: -2,693.76 mL [03/16/22 1728]  Weights:  Filed Weights   03/15/22 0325 03/15/22 1405 03/16/22 0213  Weight: 54.1 kg 53.7 kg 50.8 kg    Telemetry: Personally reviewed.  Sinus and Sinus Tachycardia.   Physical examination: PHYSICAL EXAM:    03/16/2022    4:20 PM 03/16/2022   12:02 PM 03/16/2022    8:08 AM  Vitals with BMI  Systolic 124 580 998  Diastolic 72 67 68  Pulse 338 93 87    CONSTITUTIONAL: Appears older than stated age, cachectic, hemodynamically stable, no acute distress SKIN: Skin is warm and dry. No rash noted. No cyanosis. No pallor. No jaundice HEAD: Normocephalic and atraumatic.  EYES: No scleral icterus MOUTH/THROAT: Dry mucous membrane, poor oral dentition NECK: No JVD present. No thyromegaly noted. No carotid bruits  CHEST Normal respiratory effort. No intercostal retractions  LUNGS: Decreased breath sounds bilaterally left greater than right, diffuse rhonchi CARDIOVASCULAR: Regular, tachycardic, no murmurs rubs or gallops appreciated secondary to  tachycardia.   ABDOMINAL: Soft, cachectic, positive bowel sounds in all 4 quadrants,  EXTREMITIES: No pitting edema, warm to touch.  HEMATOLOGIC: No significant bruising NEUROLOGIC: Oriented to person, place, and time. Nonfocal. Normal muscle tone.  PSYCHIATRIC: Normal mood and affect. Normal behavior. Cooperative. No significant change in physical examination since yesterday  Lab Results: Hematology Recent Labs  Lab 03/13/22 1158 03/15/22 0153 03/16/22 0044  WBC 12.6* 12.4* 10.7*  RBC 4.78 4.51 4.18*  HGB 11.7* 11.1* 10.3*  HCT 37.7* 36.0* 32.1*  MCV 78.9* 79.8* 76.8*  MCH 24.5* 24.6* 24.6*  MCHC 31.0 30.8 32.1  RDW 14.7 14.6 14.5  PLT 656* 539* 543*    Chemistry Recent Labs  Lab 03/13/22 1158 03/15/22 0153 03/16/22 0044  NA 141 135 138  K 4.2 3.9 3.6  CL 106 99 102  CO2 30 26 27   GLUCOSE 100* 121* 124*  BUN 16 13 11   CREATININE 0.64 0.82 0.85  CALCIUM 8.4* 8.2* 8.0*  PROT 7.0  --   --   ALBUMIN 2.6*  --   --   AST 23  --   --   ALT 14  --   --   ALKPHOS 77  --   --   BILITOT 0.3  --   --   GFRNONAA >60 >60 >60  ANIONGAP 5 10 9      Cardiac Enzymes: Cardiac Panel (last 3 results) Recent Labs    03/15/22 0322 03/15/22 0805 03/15/22 1014  TROPONINIHS 2,469* 6,234* 3,377*    BNP (last 3 results) Recent Labs  03/15/22 1437  BNP 38.4    ProBNP (last 3 results) No results for input(s): "PROBNP" in the last 8760 hours.   DDimer No results for input(s): "DDIMER" in the last 168 hours.   Hemoglobin A1c:  Lab Results  Component Value Date   HGBA1C 5.5 01/21/2021   MPG 111 01/21/2021    TSH  Recent Labs    03/13/22 1158  TSH 1.041    Lipid Panel     Component Value Date/Time   CHOL 132 01/17/2022 1634   TRIG 64 01/17/2022 1634   HDL 34 (L) 01/17/2022 1634   CHOLHDL 3.9 01/17/2022 1634   LDLCALC 84 01/17/2022 1634    Imaging: ECHOCARDIOGRAM COMPLETE  Result Date: 03/15/2022    ECHOCARDIOGRAM REPORT   Patient Name:   Steven Johnston Date of Exam: 03/15/2022 Medical Rec #:  [id number]       Height:       70.0 in Accession #:    [id number]      Weight:       119.3 lb Date of Birth:  [birth year]        BSA:          1.676 m Patient Age:    73 years        BP:           107/70 mmHg Patient Gender: M               HR:           84 bpm. Exam Location:  Inpatient Procedure: 2D Echo, 3D Echo, Cardiac Doppler, Color Doppler, Intracardiac            Opacification Agent and Strain Analysis Indications:    NSTEMI  History:        Patient has no prior history of Echocardiogram examinations.                 Signs/Symptoms:Chest Pain. HIV. Lung cancer.  Sonographer:    Merrie Roof RDCS Referring Phys: [id number] [place]  1. Left ventricular ejection fraction, by estimation, is 60 to 65%. The left ventricle has normal function. The left ventricle has no regional wall motion abnormalities. Left ventricular diastolic parameters were normal.  2. Right ventricular systolic function is normal. The right ventricular size is visually dilated (mild).  3. Left atrial size was moderately dilated.  4. Small to moderate size pericardial effusion. The pericardial effusion is posterior to the left ventricle. There is no evidence of cardiac tamponade. Large pleural effusion in the left lateral region.  5. The mitral valve is degenerative. Trivial mitral valve regurgitation. No evidence of mitral stenosis.  6. The aortic valve is grossly normal. Aortic valve regurgitation is not visualized. No aortic stenosis is present.  7. Aortic Proximal ascending aorta normal in size. There is borderline dilatation of the aortic root, measuring 38 mm.  8. The inferior vena cava is normal in size with greater than 50% respiratory variability, suggesting right atrial pressure of 3 mmHg. Comparison(s): No prior Echocardiogram. Conclusion(s)/Recommendation(s): No left ventricular mural or apical thrombus/thrombi. FINDINGS  Left Ventricle: Left ventricular ejection fraction,  by estimation, is 60 to 65%. The left ventricle has normal function. The left ventricle has no regional wall motion abnormalities. Definity contrast agent was given IV to delineate the left ventricular  endocardial borders. Global longitudinal strain performed but not reported based on interpreter judgement due to suboptimal tracking. The left ventricular internal cavity size was normal in size. There is  no left ventricular hypertrophy. Left ventricular diastolic parameters were normal. Right Ventricle: The right ventricular size is visually dilated (mild). No increase in right ventricular wall thickness. Right ventricular systolic function is normal. Left Atrium: Left atrial size was moderately dilated. Right Atrium: Right atrial size was not well visualized. Pericardium: Small to moderate size pericardial effusion. The pericardial effusion is posterior to the left ventricle. There is no evidence of cardiac tamponade. Mitral Valve: The mitral valve is degenerative in appearance. There is moderate thickening of the mitral valve leaflet(s). Normal mobility of the mitral valve leaflets. Trivial mitral valve regurgitation. No evidence of mitral valve stenosis. Tricuspid Valve: The tricuspid valve is grossly normal. Tricuspid valve regurgitation is mild . No evidence of tricuspid stenosis. Aortic Valve: The aortic valve is grossly normal. Aortic valve regurgitation is not visualized. No aortic stenosis is present. Aortic valve mean gradient measures 2.0 mmHg. Aortic valve peak gradient measures 4.5 mmHg. Aortic valve area, by VTI measures 3.07 cm. Pulmonic Valve: The pulmonic valve was normal in structure. Pulmonic valve regurgitation is not visualized. No evidence of pulmonic stenosis. Aorta: Proximal ascending aorta normal in size. There is borderline dilatation of the aortic root, measuring 38 mm. Venous: The inferior vena cava is normal in size with greater than 50% respiratory variability, suggesting right atrial  pressure of 3 mmHg. IAS/Shunts: The interatrial septum was not well visualized. Additional Comments: There is a large pleural effusion in the left lateral region.  LEFT VENTRICLE PLAX 2D LVIDd:         3.70 cm     Diastology LVIDs:         2.50 cm     LV e' medial:    11.60 cm/s LV PW:         1.00 cm     LV E/e' medial:  8.2 LV IVS:        1.10 cm     LV e' lateral:   11.85 cm/s LVOT diam:     2.20 cm     LV E/e' lateral: 8.1 LV SV:         60 LV SV Index:   36 LVOT Area:     3.80 cm                             3D Volume EF: LV Volumes (MOD)           3D EF:        71 % LV vol d, MOD A2C: 92.7 ml LV EDV:       162 ml LV vol d, MOD A4C: 62.4 ml LV ESV:       47 ml LV vol s, MOD A2C: 23.4 ml LV SV:        115 ml LV vol s, MOD A4C: 20.0 ml LV SV MOD A2C:     69.3 ml LV SV MOD A4C:     62.4 ml LV SV MOD BP:      60.4 ml RIGHT VENTRICLE             IVC RV Basal diam:  4.60 cm     IVC diam: 1.60 cm RV S prime:     12.30 cm/s TAPSE (M-mode): 2.1 cm LEFT ATRIUM             Index LA diam:        2.10 cm 1.25 cm/m LA Vol (A2C):   82.0 ml  48.94 ml/m LA Vol (A4C):   86.2 ml 51.44 ml/m LA Biplane Vol: 85.6 ml 51.09 ml/m  AORTIC VALVE AV Area (Vmax):    3.16 cm AV Area (Vmean):   3.16 cm AV Area (VTI):     3.07 cm AV Vmax:           106.00 cm/s AV Vmean:          71.900 cm/s AV VTI:            0.197 m AV Peak Grad:      4.5 mmHg AV Mean Grad:      2.0 mmHg LVOT Vmax:         88.00 cm/s LVOT Vmean:        59.700 cm/s LVOT VTI:          0.159 m LVOT/AV VTI ratio: 0.81  AORTA Ao Root diam: 3.80 cm Ao Asc diam:  2.90 cm MITRAL VALVE MV Area (PHT): 5.66 cm    SHUNTS MV Decel Time: 134 msec    Systemic VTI:  0.16 m MV E velocity: 95.40 cm/s  Systemic Diam: 2.20 cm MV A velocity: 68.10 cm/s MV E/A ratio:  1.40 Steven Oberhaus DO Electronically signed by Rex Kras DO Signature Date/Time: 03/15/2022/12:45:23 PM    Final    CT Angio Chest PE W and/or Wo Contrast  Result Date: 03/15/2022 CLINICAL DATA:  61 year old male with  central chest pain and shortness of breath this evening. History of lung cancer, currently undergoing chemotherapy. EXAM: CT ANGIOGRAPHY CHEST WITH CONTRAST TECHNIQUE: Multidetector CT imaging of the chest was performed using the standard protocol during bolus administration of intravenous contrast. Multiplanar CT image reconstructions and MIPs were obtained to evaluate the vascular anatomy. RADIATION DOSE REDUCTION: This exam was performed according to the departmental dose-optimization program which includes automated exposure control, adjustment of the mA and/or kV according to patient size and/or use of iterative reconstruction technique. CONTRAST:  26mL OMNIPAQUE IOHEXOL 350 MG/ML SOLN COMPARISON:  PET-CT 02/26/2022.  Chest CT 02/05/2022. FINDINGS: Cardiovascular: No filling defects within the pulmonary arterial tree to suggest pulmonary embolism. Heart size is normal. No definite pericardial fluid or thickening. No pericardial calcification. Atherosclerotic calcifications are noted in the left main, left anterior descending, left circumflex and right coronary arteries. Mediastinum/Nodes: Numerous borderline enlarged and enlarged mediastinal and bilateral hilar lymph nodes are noted measuring up to 1.7 cm in the low right paratracheal nodal station and 1.8 cm in the right hilar region. Bulky soft tissues also noted in the left hilar region, contiguous with adjacent abnormal soft tissue along, difficult to accurately measure. Esophagus is unremarkable in appearance. No axillary lymphadenopathy. Left-sided supraclavicular lymphadenopathy measuring up to 1.6 cm in short axis (axial image 26 of series 5). Lungs/Pleura: Increasingly apparent nodular opacities in the base of the right lung measuring up to 2.8 x 1.7 cm (axial image 149 of series 6), likely progressive metastatic lesions. Multiple other smaller pulmonary nodules scattered elsewhere in the right lung. Extensive mass-like consolidation throughout the  left lung, most severe in the left upper lobe but with additional areas of nodularity and irregular nodular septal thickening throughout the left lower lobe, progressive compared to the prior examination. Much of this in the left upper lobe likely represents tumor with additional postobstructive changes, and evolving lymphangitic spread of disease throughout the entirety of the left lung. Small left pleural effusion predominantly subpulmonic and in the dependent portion of the base of the left hemithorax. No right pleural effusion. Upper Abdomen:  Soft tissue surrounding the aorta in the upper abdomen, presumably lymphadenopathy, poorly evaluated on today's arterial phase examination. Musculoskeletal: Large infiltrative lytic lesion in the right scapula centered in the region of the glenoid. Review of the MIP images confirms the above findings. IMPRESSION: 1. No evidence of pulmonary embolism. 2. Progressive thoracic malignancy with increasing mass-like consolidation of the left upper lobe, progressive lymphangitic spread of tumor throughout the left lung, enlarging malignant left pleural effusion, enlarging metastatic pulmonary nodules in the right lung, lymphadenopathy in the left supraclavicular, retroperitoneal, mediastinal and bilateral hilar nodal stations, and osseous metastatic disease in the right scapula, as above. 3. Aortic atherosclerosis, in addition to left main and three-vessel coronary artery disease. Aortic Atherosclerosis (ICD10-I70.0). Electronically Signed   By: Vinnie Langton M.D.   On: 03/15/2022 05:32   DG Chest 2 View  Result Date: 03/15/2022 CLINICAL DATA:  Chest pain and increasing shortness of breath, history of lung carcinoma EXAM: CHEST - 2 VIEW COMPARISON:  02/26/2022 PET CT, plain film from 02/06/2022 FINDINGS: Persistent masslike consolidation is noted apex of the left lung with increasing pleural based density laterally when compared with the prior plain film examination. There  is also likely increasing effusion present as well as neoplastic involvement in the non consolidated lung. Right lung is mildly hyperinflated. The erosive changes in the right scapula are again identified but better visualized on prior PET-CT. IMPRESSION: Progressive consolidation involving the left lung. The majority of this is related to the known neoplasm although a superimposed more acute process such as pneumonia may be present as well given the abrupt onset. Right lung remains clear. Electronically Signed   By: Inez Catalina M.D.   On: 03/15/2022 02:26    CARDIAC DATABASE: EKG: 03/15/2022 Sinus rhythm, 99 bpm, incomplete right bundle branch block, without underlying injury pattern.   Sinus tachycardia, 104 bpm, without underlying ischemia or injury pattern.   Sinus rhythm, 81 bpm, without underlying ischemia or injury pattern.  Echocardiogram: 03/15/2022 1. Left ventricular ejection fraction, by estimation, is 60 to 65%. The  left ventricle has normal function. The left ventricle has no regional  wall motion abnormalities. Left ventricular diastolic parameters were  normal.   2. Right ventricular systolic function is normal. The right ventricular  size is visually dilated (mild).   3. Left atrial size was moderately dilated.   4. Small to moderate size pericardial effusion. The pericardial effusion  is posterior to the left ventricle. There is no evidence of cardiac  tamponade. Large pleural effusion in the left lateral region.   5. The mitral valve is degenerative. Trivial mitral valve regurgitation.  No evidence of mitral stenosis.   6. The aortic valve is grossly normal. Aortic valve regurgitation is not  visualized. No aortic stenosis is present.   7. Aortic Proximal ascending aorta normal in size. There is borderline  dilatation of the aortic root, measuring 38 mm.   8. The inferior vena cava is normal in size with greater than 50%  respiratory variability, suggesting right  atrial pressure of 3 mmHg.   Scheduled Meds:  aspirin  81 mg Oral Daily   atorvastatin  40 mg Oral QHS   bictegravir-emtricitabine-tenofovir AF  1 tablet Oral Daily   clopidogrel  75 mg Oral Daily   diltiazem  30 mg Oral Q6H   [START ON 03/18/2022] enoxaparin (LOVENOX) injection  40 mg Subcutaneous Q24H   enoxaparin (LOVENOX) injection  50 mg Subcutaneous BID   sodium chloride flush  3 mL Intravenous Q12H  torsemide  10 mg Oral QODAY    Continuous Infusions:   PRN Meds: acetaminophen **OR** acetaminophen, albuterol, benzonatate, HYDROcodone bit-homatropine, morphine injection, ondansetron **OR** ondansetron (ZOFRAN) IV, oxyCODONE   IMPRESSION & RECOMMENDATIONS: Kitai Purdom is a 61 y.o. African-American male whose past medical history and cardiac risk factors include: HIV, hepatitis, stage IVb non small cell lung cancer with widespread nodal disease in the chest/abdomen/skeletal system/cutaneous involvement/brain metastases undergoing palliative radiation and immunotherapy, former smoker, cocaine use.   Impression: NSTEMI. Precordial pain-resolved. Shortness of breath likely secondary to underlying stage IV metastatic lung cancer Aortic atherosclerosis. Coronary artery calcification-noted on nongated CT study. Cocaine use. 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 High sensitive troponins peaked at 6234. EKG essentially nonischemic. Echocardiogram notes preserved LVEF, no regional wall motion abnormalities, no significant valvular heart disease. Urine drug screen positive for cocaine. His NSTEMI likely precipitated by recent cocaine use leading to coronary vasospasm, palliative radiation/immunotherapy (see oncology consult). Given his progressive stage IV metastatic lung cancer and other comorbid conditions patient would like to continue with  conservative management and this was echoed by oncology & palliative team as well. Currently on Lovenox per ACS protocol-for 48 hours and then transition to DVT prophylaxis dosing We will avoid beta-blocker therapy given his cocaine use. Uptitrate Cardizem to 30 mg p.o. qid, transition to long-acting prior to discharge. Continue dual antiplatelet therapy for 6 months. Continue Lipitor. May use sublingual nitroglycerin tablets on as needed basis.   Telemetry reviewed-no significant dysrhythmia.  Shortness of breath:  Likely secondary to his progressive lung cancer.   Diuresing well on IV Lasix. Will transition to oral torsemide 10mg  every other day. Monitor BUN and creatinine. May need pulmonary evaluation for symptom management-will defer to primary team.  Aortic atherosclerosis/coronary artery calcification: Continue aspirin and statin therapy.  Cocaine use: Reemphasized importance of complete cessation.  We will avoid beta-blockers.  No additional cardiovascular testing warranted at this time.  We will follow the patient peripherally.  Please reach out if any questions or concerns arise.  Patient's questions and concerns were addressed to his satisfaction. He voices understanding of the instructions provided during this encounter.   This note was created using a voice recognition software as a result there may be grammatical errors inadvertently enclosed that do not reflect the nature of this encounter. Every attempt is made to correct such errors.  Total time spent: 25 minutes.  Mechele Claude [facility]  Pager: [phone] Office: [phone] 03/16/2022, 5:28 PM

## 2022-03-16 NOTE — Progress Notes (Signed)
PROGRESS NOTE  Steven Johnston BSJ:[id number] DOB: [birth year]   PCP: Patient, No Pcp Per  Patient is from: Home.  DOA: 03/15/2022 LOS: 0  Chief complaints Chief Complaint  Patient presents with   Chest Pain     Brief Narrative / Interim history: 61 year old M with PMH of HIV, hep B, NSCLC with widespread mets s/p palliative radiation and chemo now on immunotherapy, prior tobacco use and cocaine use presenting with chest pain and DOE, and admitted for non-STEMI and acute respiratory failure with hypoxia.  Desaturated to 80s requiring 6 L to recover to 90s.  Troponin trended from 1200-6400.  UDS positive for cocaine.  CTA chest showed progressive thoracic malignancy with increased masslike consolidation in left upper lobe, progressive lymphogenic spread of tumor throughout the left lung, a large malignant left pleural effusion, enlarged metastatic pulmonary nodules in right lung and osseous metastasis to the right scapula.  Steven Johnston cardiology consulted for non-STEMI and recommended conservative management.  Oncology consulted for lung cancer.  Pulmonology consulted and did not feel there is utility to thoracocentesis since most of his left lung is tumor tissue, I recommended hospice.  Palliative medicine consulted as well.  Subjective: Seen and examined earlier this morning.  Increased oxygen requirement overnight.  He is currently on 9 L by HFNC.  He denies shortness of breath at rest but significant DOE with minimal exertion.  Reports back pain but no chest pain.  He reports dry cough.  Denies hemoptysis.  Denies GI or UTI issues.  Objective: Vitals:   03/16/22 0213 03/16/22 0336 03/16/22 0808 03/16/22 1202  BP:  112/70 108/68 103/67  Pulse:  (!) 42 87 93  Resp:  18 (!) 22 20  Temp:  97.7 F (36.5 C) 97.8 F (36.6 C)   TempSrc:  Oral Oral   SpO2:  90% 95% 97%  Weight: 50.8 kg     Height:        Examination:  GENERAL: Appears frail and chronically ill HEENT: MMM.  Vision and  hearing grossly intact.  NECK: Supple.  No apparent JVD.  RESP:  No IWOB.  Rhonchi bilaterally.  Somewhat diminished aeration on the left CVS:  RRR. Heart sounds normal.  ABD/GI/GU: BS+. Abd soft, NTND.  MSK/EXT: Significant muscle mass and subcu fat loss. SKIN: Sore over mid back/vertebral NEURO: Awake, alert and oriented appropriately.  No apparent focal neuro deficit. PSYCH: Calm. Normal affect.       Procedures:  None  Microbiology summarized: None  Assessment and plan: Principal Problem:   NSTEMI (non-ST elevated myocardial infarction) ([place]) Active Problems:   Acute respiratory failure with hypoxia (HCC)   Non-small cell lung cancer (HCC)   Leukocytosis   HIV disease (HCC)   Microcytic hypochromic anemia   Thrombocytosis   Protein-calorie malnutrition, severe (HCC)   Elevated troponin I level   Precordial pain   Aortic atherosclerosis (HCC)   Coronary atherosclerosis due to calcified coronary lesion   Cocaine use   Metastatic non-small cell lung cancer (HCC)   Pressure injury of skin  Acute respiratory failure with hypoxia likely due to lung cancer: Chest x-ray and CT reviewed as above.  Unfortunately, this is going to be a progressive issue.  He is currently on 9 L with HFNC.  I do not know if there is any utility to steroid here.  Low suspicion for infection with negative procalcitonin. -Appreciate input by PCCM-recommended considering hospice.  -Continue supportive care with supplemental oxygen -Appreciate help by palliative medicine.  I agree with  DNR/DNI.  Non-STEMI/elevated troponin/chest pain: In the setting of cocaine use and hypoxemia.  Troponin peaked at 6234 and trended down.  BNP normal.  TTE with LVEF of 60 to 65%, G1 DD, moderate LAE, small to moderate pericardial effusion.  CTA chest as above.  Chest pain could partly be due to malignancy. -Cardiology following-recommended medical management  NSCLC with widespread metastasis s/p palliative radiation,  chemo and currently on immunotherapy. -Appreciate input by oncology -Palliative medicine following.  HIV/AIDS -Continue home Biktarvy  Cocaine use: UDS positive but patient denies use. -Encouraged to refrain  Goal of care counseling: Palliative met with patient.  CODE STATUS changed to DNR/DNI.  Leukocytosis/thrombocytosis: Reactive.  Severe malnutrition: As evidenced by low BMI and significant muscle mass and subcu fat loss. Body mass index is 16.07 kg/m.  Pressure skin injury: POA Pressure Injury 03/15/22 Sacrum Right;Left Stage 2 -  Partial thickness loss of dermis presenting as a shallow open injury with a red, pink wound bed without slough. (Active)  03/15/22 1628  Location: Sacrum  Location Orientation: Right;Left  Staging: Stage 2 -  Partial thickness loss of dermis presenting as a shallow open injury with a red, pink wound bed without slough.  Wound Description (Comments):   Present on Admission: Yes  Dressing Type Foam - Lift dressing to assess site every shift 03/16/22 0752   DVT prophylaxis:  Patient is on IV heparin.   Code Status: DNR/DNI Family Communication: None at bedside Level of care: Telemetry Medical Status is: Inpatient Remains inpatient appropriate because: Acute respiratory failure with hypoxia and NSTEMI   Final disposition: TBD Consultants:  Oncology PCCM Palliative medicine Cardiology  Sch Meds:  Scheduled Meds:  aspirin  81 mg Oral Daily   atorvastatin  40 mg Oral QHS   bictegravir-emtricitabine-tenofovir AF  1 tablet Oral Daily   clopidogrel  75 mg Oral Daily   diltiazem  30 mg Oral Q8H   enoxaparin (LOVENOX) injection  60 mg Subcutaneous BID   furosemide  20 mg Intravenous BID   sodium chloride flush  3 mL Intravenous Q12H   Continuous Infusions: PRN Meds:.acetaminophen **OR** acetaminophen, albuterol, benzonatate, HYDROcodone bit-homatropine, morphine injection, ondansetron **OR** ondansetron (ZOFRAN) IV,  oxyCODONE  Antimicrobials: Anti-infectives (From admission, onward)    Start     Dose/Rate Route Frequency Ordered Stop   03/15/22 2345  bictegravir-emtricitabine-tenofovir AF (BIKTARVY) 50-200-25 MG per tablet 1 tablet        1 tablet Oral Daily 03/15/22 2247          I have personally reviewed the following labs and images: CBC: Recent Labs  Lab 03/13/22 1158 03/15/22 0153 03/16/22 0044  WBC 12.6* 12.4* 10.7*  NEUTROABS 9.6*  --   --   HGB 11.7* 11.1* 10.3*  HCT 37.7* 36.0* 32.1*  MCV 78.9* 79.8* 76.8*  PLT 656* 539* 543*   BMP &GFR Recent Labs  Lab 03/13/22 1158 03/15/22 0153 03/16/22 0044  NA 141 135 138  K 4.2 3.9 3.6  CL 106 99 102  CO2 _0 GLUCOSE 100* 121* 124*  BUN _1 CREATININE 0.64 0.82 0.85  CALCIUM 8.4* 8.2* 8.0*   Estimated Creatinine Clearance: 65.6 mL/min (by C-G formula based on SCr of 0.85 mg/dL). Liver & Pancreas: Recent Labs  Lab 03/13/22 1158  AST 23  ALT 14  ALKPHOS 77  BILITOT 0.3  PROT 7.0  ALBUMIN 2.6*   No results for input(s): "LIPASE", "AMYLASE" in the last 168 hours. No results for input(s): "AMMONIA"  in the last 168 hours. Diabetic: No results for input(s): "HGBA1C" in the last 72 hours. No results for input(s): "GLUCAP" in the last 168 hours. Cardiac Enzymes: No results for input(s): "CKTOTAL", "CKMB", "CKMBINDEX", "TROPONINI" in the last 168 hours. No results for input(s): "PROBNP" in the last 8760 hours. Coagulation Profile: No results for input(s): "INR", "PROTIME" in the last 168 hours. Thyroid Function Tests: No results for input(s): "TSH", "T4TOTAL", "FREET4", "T3FREE", "THYROIDAB" in the last 72 hours. Lipid Profile: No results for input(s): "CHOL", "HDL", "LDLCALC", "TRIG", "CHOLHDL", "LDLDIRECT" in the last 72 hours. Anemia Panel: No results for input(s): "VITAMINB12", "FOLATE", "FERRITIN", "TIBC", "IRON", "RETICCTPCT" in the last 72 hours. Urine analysis:    Component Value Date/Time    COLORURINE YELLOW 10/13/2020 2351   APPEARANCEUR CLEAR 10/13/2020 2351   LABSPEC 1.018 10/13/2020 2351   PHURINE 6.0 10/13/2020 2351   GLUCOSEU NEGATIVE 10/13/2020 2351   HGBUR NEGATIVE 10/13/2020 2351   BILIRUBINUR NEGATIVE 10/13/2020 2351   [place] 10/13/2020 2351   PROTEINUR NEGATIVE 10/13/2020 2351   UROBILINOGEN 0.2 07/06/2013 0453   NITRITE NEGATIVE 10/13/2020 2351   LEUKOCYTESUR TRACE (A) 10/13/2020 2351   Sepsis Labs: Invalid input(s): "PROCALCITONIN", "LACTICIDVEN"  Microbiology: No results found for this or any previous visit (from the past 240 hour(s)).  Radiology Studies: No results found.    Shakeem Stern T. [facility]  If 7PM-7AM, please contact night-coverage [email or web address] 03/16/2022, 12:34 PM

## 2022-03-16 NOTE — Progress Notes (Signed)
Supplemental O2 increased to 9 LPM via HFNC. Pt denied any hycodan and morphine for SOB or dyspnea. He agreed to allow this RN to increase O2 only. RR, and work of breathing improve instantly with 9 LPM. Sats 85-90 with coughing episode.

## 2022-03-16 NOTE — Progress Notes (Signed)
Daily Progress Note   Patient Name: Steven Johnston       Date: 03/16/2022 DOB: November 19, 1960  Age: 61 y.o. MRN#: [id number] Attending Physician: Mercy Riding, MD Primary Care Physician: Patient, No Pcp Per Admit Date: 03/15/2022  Reason for Consultation/Follow-up: Establishing goals of care  Subjective: Medical records reviewed including progress notes, labs. Patient assessed at the bedside.  He reports feeling better today.  No family present during my visit.  Breathing comfortably on 8 L nasal cannula.  Created space and opportunity for patient's thoughts and feelings on his current illness after our initial conversation yesterday.  He states that he had several visits from friends and family throughout the evening and he was very happy that his daughter was able to come as well.  He clarifies that her birthday is on the 17th of next month and surviving until then is his first goal.  He was unable to discuss his recent decisions with his mother and son given the presence of other family members.  Recommended completion of a MOST form to reflect his desire for DNR and limited medical interventions, as well as other care preferences. An extensive conversation was had, covering concepts specific to code status, artifical feeding and hydration, continued IV antibiotics and rehospitalization. He would like to review further independently and then possibly complete the form tomorrow. Of note, he also states he "may change his mind" on his decisions. Hard Choices booklet provided for review and patient was encouraged to review as well.  Questions and concerns addressed. PMT will continue to support holistically.   Length of Stay: 0   Physical Exam Vitals and nursing note reviewed.  Constitutional:       Interventions: Nasal cannula in place.     Comments: 8L  Cardiovascular:     Rate and Rhythm: Normal rate.  Pulmonary:     Effort: Pulmonary effort is normal. No respiratory distress.  Neurological:     Mental Status: He is alert and oriented to person, place, and time.  Psychiatric:        Mood and Affect: Mood normal.        Behavior: Behavior normal.            Vital Signs: BP 108/68 (BP Location: Left Arm)   Pulse 87   Temp 97.8 F (36.6  C) (Oral)   Resp (!) 22   Ht 5\' 10"  (1.778 m)   Wt 50.8 kg   SpO2 95%   BMI 16.07 kg/m  SpO2: SpO2: 95 % O2 Device: O2 Device: Nasal Cannula O2 Flow Rate: O2 Flow Rate (L/min): 9 L/min      Palliative Assessment/Data: 50%    Palliative Care Assessment & Plan   Patient Profile: 61 y.o. 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    Patient had just recently been hospitalized 9/6-9/8 with complaints of worsening cough and right shoulder pain.  Found to  metastatic disease with biopsy of the left supraclavicular noduleconglomeration bx showing non small cell carcinoma.   Patient now admitted with NSTEMI and CT angiogram of the chest obtained noting progressive malignancy, enlarging malignant left pleural effusion.  PMT has been consulted to assist with goals of care conversation.    Assessment: Goals of care conversation NSTEMI Stage IV metastatic lung cancer HIV  Recommendations/Plan: Continue DNR Continue current care without further escalation MOST form introduced today Psychosocial and emotional support provided PMT will continue to follow and support   Prognosis: < 6 months would not be surprising, hospice appropriate when aligned with patient goals of care   Discharge Planning: To Be  Determined   Care plan was discussed with patient   MDM high         [place] [facility]fund Johnnette Litter, PA-C  Palliative Medicine Team Team phone # [phone]  Thank you for allowing the Palliative Medicine Team to assist in the care of this patient. Please utilize secure chat with additional questions, if there is no response within 30 minutes please call the above phone number.  Palliative Medicine Team providers are available by phone from 7am to 7pm daily and can be reached through the team cell phone.  Should this patient require assistance outside of these hours, please call the patient's attending physician.

## 2022-03-17 ENCOUNTER — Encounter: Payer: Self-pay | Admitting: Internal Medicine

## 2022-03-17 DIAGNOSIS — I214 Non-ST elevation (NSTEMI) myocardial infarction: Secondary | ICD-10-CM | POA: Diagnosis not present

## 2022-03-17 DIAGNOSIS — E43 Unspecified severe protein-calorie malnutrition: Secondary | ICD-10-CM | POA: Diagnosis not present

## 2022-03-17 DIAGNOSIS — C3492 Malignant neoplasm of unspecified part of left bronchus or lung: Secondary | ICD-10-CM | POA: Diagnosis not present

## 2022-03-17 DIAGNOSIS — J9601 Acute respiratory failure with hypoxia: Secondary | ICD-10-CM | POA: Diagnosis not present

## 2022-03-17 LAB — RENAL FUNCTION PANEL
Albumin: 1.6 g/dL — ABNORMAL LOW (ref 3.5–5.0)
Anion gap: 7 (ref 5–15)
BUN: 12 mg/dL (ref 8–23)
CO2: 30 mmol/L (ref 22–32)
Calcium: 7.8 mg/dL — ABNORMAL LOW (ref 8.9–10.3)
Chloride: 99 mmol/L (ref 98–111)
Creatinine, Ser: 0.8 mg/dL (ref 0.61–1.24)
GFR, Estimated: >60 mL/min (ref >60)
Glucose, Bld: 99 mg/dL (ref 70–99)
Phosphorus: 2.8 mg/dL (ref 2.5–4.6)
Potassium: 3.8 mmol/L (ref 3.5–5.1)
Sodium: 136 mmol/L (ref 135–145)

## 2022-03-17 LAB — CBC
HCT: 35.1 % — ABNORMAL LOW (ref 39.0–52.0)
Hemoglobin: 10.8 g/dL — ABNORMAL LOW (ref 13.0–17.0)
MCH: 24 pg — ABNORMAL LOW (ref 26.0–34.0)
MCHC: 30.8 g/dL (ref 30.0–36.0)
MCV: 78 fL — ABNORMAL LOW (ref 80.0–100.0)
Platelets: 604 10*3/uL — ABNORMAL HIGH (ref 150–400)
RBC: 4.5 MIL/uL (ref 4.22–5.81)
RDW: 14.3 % (ref 11.5–15.5)
WBC: 11.7 10*3/uL — ABNORMAL HIGH (ref 4.0–10.5)
nRBC: 0 % (ref 0.0–0.2)

## 2022-03-17 LAB — MAGNESIUM: Magnesium: 1.8 mg/dL (ref 1.7–2.4)

## 2022-03-17 LAB — CALCIUM, IONIZED: Calcium, Ionized, Serum: 4.9 mg/dL (ref 4.5–5.6)

## 2022-03-17 MED ORDER — ENOXAPARIN SODIUM 60 MG/0.6ML IJ SOSY: 50.0000 mg | PREFILLED_SYRINGE | Freq: Two times a day (BID) | INTRAMUSCULAR | Status: DC

## 2022-03-17 MED ORDER — ENSURE ENLIVE PO LIQD
237.0000 mL | Freq: Three times a day (TID) | ORAL | Status: DC
  Administered 2022-03-17 – 2022-03-26 (×19): 237 mL via ORAL

## 2022-03-17 MED ORDER — PROSOURCE PLUS PO LIQD
30.0000 mL | Freq: Three times a day (TID) | ORAL | Status: DC
  Administered 2022-03-17 – 2022-03-23 (×4): 30 mL via ORAL
  Filled 2022-03-17 (×22): qty 30

## 2022-03-17 MED ORDER — ADULT MULTIVITAMIN W/MINERALS CH
1.0000 | ORAL_TABLET | Freq: Every day | ORAL | Status: DC
  Administered 2022-03-17 – 2022-03-28 (×11): 1 via ORAL
  Filled 2022-03-17 (×11): qty 1

## 2022-03-17 NOTE — Progress Notes (Signed)
PROGRESS NOTE  Steven Johnston HYI:[id number] DOB: 1961/03/29   PCP: Patient, No Pcp Per  Patient is from: Home.  DOA: 03/15/2022 LOS: 1  Chief complaints Chief Complaint  Patient presents with   Chest Pain     Brief Narrative / Interim history: 61 year old M with PMH of HIV, hep B, NSCLC with widespread mets s/p palliative radiation and chemo now on immunotherapy, prior tobacco use and cocaine use presenting with chest pain and DOE, and admitted for non-STEMI and acute respiratory failure with hypoxia.  Desaturated to 80s requiring 6 L to recover to 90s.  Troponin trended from 1200-6400.  UDS positive for cocaine.  CTA chest showed progressive thoracic malignancy with increased masslike consolidation in left upper lobe, progressive lymphogenic spread of tumor throughout the left lung, a large malignant left pleural effusion, enlarged metastatic pulmonary nodules in right lung and osseous metastasis to the right scapula.  [facility] cardiology consulted for non-STEMI and recommended conservative management.  Oncology consulted for lung cancer.  Pulmonology consulted and did not feel there is utility to thoracocentesis since most of his left lung is tumor tissue, and recommended hospice.  Palliative medicine recommended DNR/DNI without escalation of care. Currently requiring 6 L by HFNC.   Subjective: Seen and examined earlier this morning.  No major events overnight of this morning.  Feels winded with minimal exertion.  Currently on 6 L to maintain saturation in low 90s.  No further chest pain.  Denies GI or UTI symptoms.  Patient's father at bedside.  Objective: Vitals:   03/16/22 1919 03/17/22 0555 03/17/22 0927 03/17/22 1403  BP: 98/64 112/66 (!) 104/91 117/72  Pulse: (!) 108 93 100   Resp: 18 (!) 23    Temp: 98.2 F (36.8 C) 98.4 F (36.9 C) 98 F (36.7 C)   TempSrc: Oral Oral Oral   SpO2: 92% 94%    Weight:  49.1 kg    Height:        Examination:  GENERAL: Appears frail and  chronically ill. HEENT: MMM.  Vision and hearing grossly intact.  NECK: Supple.  No apparent JVD.  RESP:  No IWOB.  Fair aeration but very rhonchorous. CVS:  RRR. Heart sounds normal.  ABD/GI/GU: BS+. Abd soft, NTND.  MSK/EXT: Significant muscle mass and subcu fat loss. SKIN: no apparent skin lesion or wound NEURO: Awake and alert. Oriented appropriately.  No apparent focal neuro deficit. PSYCH: Calm. Normal affect.       Procedures:  None  Microbiology summarized: None  Assessment and plan: Principal Problem:   NSTEMI (non-ST elevated myocardial infarction) ([place]) Active Problems:   Acute respiratory failure with hypoxia (HCC)   Non-small cell lung cancer (HCC)   Leukocytosis   HIV disease (HCC)   Microcytic hypochromic anemia   Thrombocytosis   Protein-calorie malnutrition, severe (HCC)   Elevated troponin I level   Precordial pain   Aortic atherosclerosis (HCC)   Coronary atherosclerosis due to calcified coronary lesion   Cocaine use   Metastatic non-small cell lung cancer (HCC)   Pressure injury of skin   Shortness of breath  Acute respiratory failure with hypoxia likely due to lung cancer: Chest x-ray and CT reviewed as above.  Unfortunately, this is going to be a progressive issue.  He is currently on 9 L with HFNC.  I do not know if there is any utility to steroid here.  Low suspicion for infection with negative procalcitonin. -Appreciate input by PCCM-recommended considering hospice.  -On Demadex 10 mg daily per cardiology -  Incentive spirometry, OOB and wean off oxygen as able. -Appreciate palliative care recommendation-DNR/DNI and no escalation of care  Non-STEMI/elevated troponin/chest pain: In the setting of cocaine use and hypoxemia.  Troponin peaked at 6234 and trended down.  BNP normal.  TTE with LVEF of 60 to 65%, G1 DD, moderate LAE, small to moderate pericardial effusion.  CTA chest as above.  Chest pain could partly be due to malignancy. -Cardiology  following-recommended medical management  NSCLC with widespread metastasis s/p palliative radiation, chemo and currently on immunotherapy. -Appreciate input by oncology -Palliative medicine following.  HIV/AIDS -Continue home Biktarvy  Cocaine use: UDS positive but patient denies use. -Encouraged to refrain  Goal of care counseling: Palliative met with patient.  CODE STATUS changed to DNR/DNI.  Physical deconditioning/generalized weakness -PT/OT evaluation  Leukocytosis/thrombocytosis: Reactive.  Severe malnutrition: As evidenced by low BMI and significant muscle mass and subcu fat loss. Body mass index is 15.54 kg/m.  Pressure skin injury: POA Pressure Injury 03/15/22 Sacrum Right;Left Stage 2 -  Partial thickness loss of dermis presenting as a shallow open injury with a red, pink wound bed without slough. (Active)  03/15/22 1628  Location: Sacrum  Location Orientation: Right;Left  Staging: Stage 2 -  Partial thickness loss of dermis presenting as a shallow open injury with a red, pink wound bed without slough.  Wound Description (Comments):   Present on Admission: Yes  Dressing Type Foam - Lift dressing to assess site every shift 03/17/22 1027   DVT prophylaxis:  enoxaparin (LOVENOX) injection 40 mg Start: 03/18/22 1000  Code Status: DNR/DNI Family Communication: Updated patient's father at bedside after I obtained patient's permission with father outside.  Level of care: Telemetry Medical Status is: Inpatient Remains inpatient appropriate because: Acute respiratory failure with hypoxia   Final disposition: TBD Consultants:  Oncology PCCM Palliative medicine Cardiology  Sch Meds:  Scheduled Meds:  (feeding supplement) PROSource Plus  30 mL Oral TID BM   aspirin  81 mg Oral Daily   atorvastatin  40 mg Oral QHS   bictegravir-emtricitabine-tenofovir AF  1 tablet Oral Daily   clopidogrel  75 mg Oral Daily   diltiazem  30 mg Oral Q6H   [START ON 03/18/2022]  enoxaparin (LOVENOX) injection  40 mg Subcutaneous Q24H   feeding supplement  237 mL Oral TID BM   multivitamin with minerals  1 tablet Oral Daily   sodium chloride flush  3 mL Intravenous Q12H   torsemide  10 mg Oral QODAY   Continuous Infusions: PRN Meds:.acetaminophen **OR** acetaminophen, albuterol, benzonatate, HYDROcodone bit-homatropine, morphine injection, ondansetron **OR** ondansetron (ZOFRAN) IV, oxyCODONE  Antimicrobials: Anti-infectives (From admission, onward)    Start     Dose/Rate Route Frequency Ordered Stop   03/15/22 2345  bictegravir-emtricitabine-tenofovir AF (BIKTARVY) 50-200-25 MG per tablet 1 tablet        1 tablet Oral Daily 03/15/22 2247          I have personally reviewed the following labs and images: CBC: Recent Labs  Lab 03/13/22 1158 03/15/22 0153 03/16/22 0044 03/17/22 0015  WBC 12.6* 12.4* 10.7* 11.7*  NEUTROABS 9.6*  --   --   --   HGB 11.7* 11.1* 10.3* 10.8*  HCT 37.7* 36.0* 32.1* 35.1*  MCV 78.9* 79.8* 76.8* 78.0*  PLT 656* 539* 543* 604*   BMP &GFR Recent Labs  Lab 03/13/22 1158 03/15/22 0153 03/16/22 0044 03/17/22 0015  NA 141 135 138 136  K 4.2 3.9 3.6 3.8  CL 106 99 102 99  CO2  _0 GLUCOSE 100* 121* 124* 99  BUN _1 CREATININE 0.64 0.82 0.85 0.80  CALCIUM 8.4* 8.2* 8.0* 7.8*  MG  --   --   --  1.8  PHOS  --   --   --  2.8   Estimated Creatinine Clearance: 67.3 mL/min (by C-G formula based on SCr of 0.8 mg/dL). Liver & Pancreas: Recent Labs  Lab 03/13/22 1158 03/17/22 0015  AST 23  --   ALT 14  --   ALKPHOS 77  --   BILITOT 0.3  --   PROT 7.0  --   ALBUMIN 2.6* 1.6*   No results for input(s): "LIPASE", "AMYLASE" in the last 168 hours. No results for input(s): "AMMONIA" in the last 168 hours. Diabetic: No results for input(s): "HGBA1C" in the last 72 hours. No results for input(s): "GLUCAP" in the last 168 hours. Cardiac Enzymes: No results for input(s): "CKTOTAL", "CKMB", "CKMBINDEX",  "TROPONINI" in the last 168 hours. No results for input(s): "PROBNP" in the last 8760 hours. Coagulation Profile: No results for input(s): "INR", "PROTIME" in the last 168 hours. Thyroid Function Tests: No results for input(s): "TSH", "T4TOTAL", "FREET4", "T3FREE", "THYROIDAB" in the last 72 hours. Lipid Profile: No results for input(s): "CHOL", "HDL", "LDLCALC", "TRIG", "CHOLHDL", "LDLDIRECT" in the last 72 hours. Anemia Panel: No results for input(s): "VITAMINB12", "FOLATE", "FERRITIN", "TIBC", "IRON", "RETICCTPCT" in the last 72 hours. Urine analysis:    Component Value Date/Time   COLORURINE YELLOW 10/13/2020 2351   APPEARANCEUR CLEAR 10/13/2020 2351   LABSPEC 1.018 10/13/2020 2351   PHURINE 6.0 10/13/2020 2351   GLUCOSEU NEGATIVE 10/13/2020 2351   HGBUR NEGATIVE 10/13/2020 2351   BILIRUBINUR NEGATIVE 10/13/2020 2351   [place] 10/13/2020 2351   PROTEINUR NEGATIVE 10/13/2020 2351   UROBILINOGEN 0.2 07/06/2013 0453   NITRITE NEGATIVE 10/13/2020 2351   LEUKOCYTESUR TRACE (A) 10/13/2020 2351   Sepsis Labs: Invalid input(s): "PROCALCITONIN", "LACTICIDVEN"  Microbiology: No results found for this or any previous visit (from the past 240 hour(s)).  Radiology Studies: No results found.    Brindley Madarang T. [place]  If 7PM-7AM, please contact night-coverage [email or web address] 03/17/2022, 4:10 PM

## 2022-03-17 NOTE — Progress Notes (Signed)
   03/17/22 1500  Mobility  Activity Refused mobility   Pt refused mobility d/t "Being up all day". Will f/u as able.

## 2022-03-17 NOTE — Progress Notes (Signed)
Nutrition Follow-up  DOCUMENTATION CODES:   Severe malnutrition in context of chronic illness, Underweight  INTERVENTION:  Liberalize diet from a heart healthy to a regular diet to provide widest variety of menu options to enhance nutritional adequacy Ensure Enlive po TID, each supplement provides 350 kcal and 20 grams of protein. 30 ml ProSource Plus TID, each supplement provides 100 kcals and 15 grams protein.  MVI with minerals daily  NUTRITION DIAGNOSIS:   Severe Malnutrition related to chronic illness (HIV, metastatic NSCLC) as evidenced by percent weight loss, severe fat depletion, severe muscle depletion.  GOAL:   Patient will meet greater than or equal to 90% of their needs  MONITOR:   PO intake, Supplement acceptance, Labs, Weight trends, Diet advancement  REASON FOR ASSESSMENT:   Malnutrition Screening Tool    ASSESSMENT:   Pt admitted with chest pain, found to have NSTEMI. 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  PMT following for GOC.   Pt reports consistent PO intake up until admission. He does not enjoy hospital food. Observed 2 breakfast plates on bedside table with no food remaining. Pt reports that he threw it all in the trash. He has family coming to visit today and plans to ask them to bring food. He states that at home he was eating about 2-3 meals per day. He recalls eating [employer] sandwiches and fried fish sandwiches, as well as cheese puffs for snacks. He has been drinking 3 Ensure daily and enjoys strawberry or vanilla flavor.  Meal completions: 10/14: 80% dinner 10/15: 100% x3 recorded meals  10/16: 0% breakfast  Pt recalls having a 50 lb weight loss since April. He states that his weight around that time was 158 lbs. He is uncertain as to what has caused this weight loss as he reports that  his PO intake has been unchanged.   Noted a 25.8% weight loss from 4/11-10/16 this is clinically significant for time frame.   Medications: biktarvy, torsemide  Labs reviewed  UOP: 3L x24 hours I/O's: -3.6L since admission  NUTRITION - FOCUSED PHYSICAL EXAM:  Flowsheet Row Most Recent Value  Orbital Region Severe depletion  Upper Arm Region Severe depletion  Thoracic and Lumbar Region Severe depletion  Buccal Region Severe depletion  Temple Region Severe depletion  Clavicle Bone Region Severe depletion  Clavicle and Acromion Bone Region Severe depletion  Scapular Bone Region Severe depletion  Dorsal Hand Severe depletion  Patellar Region Severe depletion  Anterior Thigh Region Severe depletion  Posterior Calf Region Severe depletion  Edema (RD Assessment) None  Hair Reviewed  Eyes Reviewed  Mouth Reviewed  Skin Reviewed  Nails Reviewed       Diet Order:   Diet Order             Diet regular Room service appropriate? Yes; Fluid consistency: Thin  Diet effective now                   EDUCATION NEEDS:   Education needs have been addressed  Skin:  Skin Assessment: Skin Integrity Issues: Skin Integrity Issues:: Stage II Stage II: sacrum  Last BM:  10/13  Height:   Ht Readings from Last 1 Encounters:  03/15/22 5\' 10"  (1.778 m)    Weight:   Wt Readings from Last 1 Encounters:  03/17/22 49.1 kg   BMI:  Body mass index is 15.54 kg/m.  Estimated Nutritional Needs:   Kcal:  1800-2000  Protein:  90-105g  Fluid:  >/=1.8L  Clayborne Dana, RDN, LDN Clinical Nutrition

## 2022-03-17 NOTE — Progress Notes (Signed)
Daily Progress Note   Patient Name: Steven Johnston       Date: 03/17/2022 DOB: 09-01-1960  Age: 61 y.o. MRN#: [id number] Attending Physician: Mercy Riding, MD Primary Care Physician: Patient, No Pcp Per Admit Date: 03/15/2022  Reason for Consultation/Follow-up: Establishing goals of care  Subjective: Medical records reviewed including progress notes, labs. Patient assessed at the bedside. He reports ongoing dyspnea. No family present during my visit. He has not eaten much, states he doesn't like the food.  Followed up on patient's review of MOST form and overall goals of care. He states he was unable to look at the form much, but his girlfriend did review it yesterday. He plans to go over it with his mother when she comes to visit later today and is agreeable to this PA checking in again tomorrow. Encouraged him to call if support is needed today during the conversation when his mother arrives.  Questions and concerns addressed. PMT will continue to support holistically.   Length of Stay: 1   Physical Exam Vitals and nursing note reviewed.  Constitutional:      Interventions: Nasal cannula in place.     Comments: 8L  Cardiovascular:     Rate and Rhythm: Normal rate.  Pulmonary:     Effort: Pulmonary effort is normal. No respiratory distress.  Neurological:     Mental Status: He is alert and oriented to person, place, and time.  Psychiatric:        Mood and Affect: Mood normal.        Behavior: Behavior normal.            Vital Signs: BP (!) 104/91   Pulse 100   Temp 98 F (36.7 C) (Oral)   Resp (!) 23   Ht 5\' 10"  (1.778 m)   Wt 49.1 kg   SpO2 94%   BMI 15.54 kg/m  SpO2: SpO2: 94 % O2 Device: O2 Device: High Flow Nasal Cannula O2 Flow Rate: O2 Flow Rate (L/min): 6  L/min      Palliative Assessment/Data: 50%    Palliative Care Assessment & Plan   Patient Profile: 61 y.o. 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    Patient had just recently been hospitalized 9/6-9/8 with complaints of worsening cough and right shoulder pain.  Found to  metastatic disease with biopsy of the left supraclavicular noduleconglomeration bx showing non small cell carcinoma.   Patient now admitted with NSTEMI and CT angiogram of the chest obtained noting progressive malignancy, enlarging malignant left pleural effusion.  PMT has been consulted to assist with goals of care conversation.    Assessment: Goals of care conversation NSTEMI Stage IV metastatic lung cancer HIV  Recommendations/Plan: Continue DNR Continue current care without further escalation Patient will continue to review MOST form Psychosocial and emotional support provided PMT will continue to follow and support   Prognosis: < 6 months would not be surprising, hospice appropriate when aligned with patient goals of care   Discharge Planning: To Be Determined   Care plan was discussed with patient   MDM high         Ulus Hazen Johnnette Litter, PA-C  Palliative Medicine Team Team phone # [phone]  Thank you for allowing the Palliative Medicine Team to assist in the care of this patient. Please utilize secure chat with additional questions, if there is no response within 30 minutes please call the above phone number.  Palliative Medicine Team providers are available by phone from 7am to 7pm daily and can be reached through the team cell phone.  Should this patient require assistance outside of these hours, please call the patient's attending physician.

## 2022-03-18 DIAGNOSIS — I214 Non-ST elevation (NSTEMI) myocardial infarction: Secondary | ICD-10-CM | POA: Diagnosis not present

## 2022-03-18 DIAGNOSIS — J9601 Acute respiratory failure with hypoxia: Secondary | ICD-10-CM | POA: Diagnosis not present

## 2022-03-18 DIAGNOSIS — C3492 Malignant neoplasm of unspecified part of left bronchus or lung: Secondary | ICD-10-CM | POA: Diagnosis not present

## 2022-03-18 DIAGNOSIS — E43 Unspecified severe protein-calorie malnutrition: Secondary | ICD-10-CM | POA: Diagnosis not present

## 2022-03-18 DIAGNOSIS — R5381 Other malaise: Secondary | ICD-10-CM

## 2022-03-18 LAB — GLUCOSE, CAPILLARY: Glucose-Capillary: 125 mg/dL — ABNORMAL HIGH (ref 70–99)

## 2022-03-18 LAB — CBC
HCT: 35.3 % — ABNORMAL LOW (ref 39.0–52.0)
Hemoglobin: 11.1 g/dL — ABNORMAL LOW (ref 13.0–17.0)
MCH: 24.3 pg — ABNORMAL LOW (ref 26.0–34.0)
MCHC: 31.4 g/dL (ref 30.0–36.0)
MCV: 77.4 fL — ABNORMAL LOW (ref 80.0–100.0)
Platelets: 588 10*3/uL — ABNORMAL HIGH (ref 150–400)
RBC: 4.56 MIL/uL (ref 4.22–5.81)
RDW: 14.3 % (ref 11.5–15.5)
WBC: 11.5 10*3/uL — ABNORMAL HIGH (ref 4.0–10.5)
nRBC: 0 % (ref 0.0–0.2)

## 2022-03-18 MED ORDER — SODIUM CHLORIDE 0.9 % IV BOLUS
250.0000 mL | Freq: Once | INTRAVENOUS | Status: AC
  Administered 2022-03-18: 250 mL via INTRAVENOUS

## 2022-03-18 MED ORDER — NITROGLYCERIN 2 % TD OINT
1.0000 [in_us] | TOPICAL_OINTMENT | Freq: Four times a day (QID) | TRANSDERMAL | Status: DC
  Administered 2022-03-19 – 2022-03-26 (×13): 1 [in_us] via TOPICAL
  Filled 2022-03-18: qty 30

## 2022-03-18 MED ORDER — MORPHINE SULFATE (PF) 2 MG/ML IV SOLN
2.0000 mg | INTRAVENOUS | Status: DC | PRN
  Administered 2022-03-21 – 2022-03-28 (×12): 2 mg via INTRAVENOUS
  Filled 2022-03-18 (×12): qty 1

## 2022-03-18 MED ORDER — LABETALOL HCL 100 MG PO TABS
100.0000 mg | ORAL_TABLET | Freq: Two times a day (BID) | ORAL | Status: DC
  Administered 2022-03-19: 100 mg via ORAL
  Filled 2022-03-18 (×2): qty 1

## 2022-03-18 MED ORDER — MORPHINE SULFATE (PF) 4 MG/ML IV SOLN
4.0000 mg | Freq: Once | INTRAVENOUS | Status: AC
  Administered 2022-03-18: 4 mg via INTRAVENOUS
  Filled 2022-03-18: qty 1

## 2022-03-18 MED ORDER — NITROGLYCERIN 0.4 MG SL SUBL
SUBLINGUAL_TABLET | SUBLINGUAL | Status: AC
  Filled 2022-03-18: qty 1

## 2022-03-18 NOTE — Progress Notes (Signed)
STEMI was activated today for chest pain and abnormal EKG suggestive of inferior STEMI.  Patient with metastatic lung cancer, substance use, being sent home on palliative/hospice care.  I reviewed the chart, patient last use of cocaine was 3 days ago.  This could still represent cocaine related coronary spasm, versus underlying coronary artery disease.  However in view of his underlying medical comorbidity, would not recommend proceeding with cardiac catheterization unless he is not significant chest pain and is not controlled by medical therapy.  Patient has been on morphine sulfate, continue the same, I will also add labetalol 100 mg p.o. twice daily for hypertension and also beta-blockade, labetalol being more of an alpha-blocker than a beta-blocker.  If he continues to be in severe pain, I will certainly take him back to the Cath Lab for cardiac catheterization and possible angioplasty from palliative care progress.  Discussed with Dr. Bridgett Larsson.

## 2022-03-18 NOTE — Progress Notes (Signed)
Additional PT Note:   SATURATION QUALIFICATIONS: (This note is used to comply with regulatory documentation for home oxygen)  Patient Saturations on Room Air at Rest = 92%  Patient Saturations on Room Air while Ambulating = 80%  Patient Saturations on 8 Liters of oxygen while Ambulating = 89%  Please briefly explain why patient needs home oxygen:Pt with significant desaturation on RA. Pt needing 8L supplemental O2 to maintain sats >88%.   Leighton Roach, PT  Acute Rehab Services Secure chat preferred Office [phone]

## 2022-03-18 NOTE — Plan of Care (Signed)
  Problem: Pain Managment: Goal: General experience of comfort will improve Outcome: Progressing   Problem: Safety: Goal: Ability to remain free from injury will improve Outcome: Progressing   

## 2022-03-18 NOTE — Progress Notes (Signed)
EKG completed by primary RN. Critical Code STEMI. On call for [facility] paged to make aware.

## 2022-03-18 NOTE — Progress Notes (Addendum)
  [facility] responded to the page. The patient is being attended to by the medical team. RN confirmed there is no family present. CH remains available for follow up support as needed. This note was prepared by Jeanine Luz, M.Div..  For questions please contact by phone [phone].

## 2022-03-18 NOTE — TOC Progression Note (Signed)
Transition of Care [facility]) - Progression Note    Patient Details  Name: Steven Johnston MRN: [id number] Date of Birth: 1961-04-10  Transition of [facility]) CM/SW Contact  Zenon Mayo, RN Phone Number: 03/18/2022, 6:46 PM  Clinical Narrative:    From home with mom, NSTEMI, acute resp failure, lung cancer, 042, PCM, aortic atherosclerosis, metastatic lung cancer. Positive drug screen.  TOC following.        Expected Discharge Plan and Services                                                 Social Determinants of Health (SDOH) Interventions    Readmission Risk Interventions     No data to display

## 2022-03-18 NOTE — Evaluation (Signed)
Physical Therapy Evaluation Patient Details Name: Steven Johnston MRN: [id number] DOB: Nov 25, 1960 Today's Date: 03/18/2022  History of Present Illness  Pt is a 61 y/o M presenting to ED on 10/14 with chest pain, admitted for NSTEMI and acute respiratory failure with hypoxia. 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 Pt from home with family where he was independent with mobility. Pt was not on home O2 PTA. On eval pt's SPO2 98% on 7L HFNC. He ambulated 20' on RA with SPO2 dropping to 80% and pt began coughing. Pt ambulated 50' x2 on 8L O2 with sats dropping to 89% and no coughing. HR range 97-106 bpm. Will follow acutely but do not anticipate pt needing PT at d/c. Pt without balance deficits or gait abnormality, largest limitation to mobility at this point is cardiopulmonary status.  Pt currently with functional limitations due to the deficits listed below (see PT Problem List). Pt will benefit from skilled PT to increase their independence and safety with mobility to allow discharge to the venue listed below.          Recommendations for follow up therapy are one component of a multi-disciplinary discharge planning process, led by the attending physician.  Recommendations may be updated based on patient status, additional functional criteria and insurance authorization.  Follow Up Recommendations No PT follow up      Assistance Recommended at Discharge PRN  Patient can return home with the following  Help with stairs or ramp for entrance;Assistance with cooking/housework;Assist for transportation    Equipment Recommendations None recommended by PT  Recommendations for Other Services       Functional Status Assessment Patient has had a recent decline in their functional status and  demonstrates the ability to make significant improvements in function in a reasonable and predictable amount of time.     Precautions / Restrictions Precautions Precautions: Fall Precaution Comments: watch O2 and BP Restrictions Weight Bearing Restrictions: No      Mobility  Bed Mobility Overal bed mobility: Needs Assistance Bed Mobility: Supine to Sit, Sit to Supine     Supine to sit: Supervision Sit to supine: Supervision   General bed mobility comments: increased time    Transfers Overall transfer level: Needs assistance Equipment used: None Transfers: Sit to/from Stand Sit to Stand: Min guard           General transfer comment: min guard A for safety    Ambulation/Gait Ambulation/Gait assistance: Min guard Gait Distance (Feet): 120 Feet (20', 50', 50') Assistive device: None Gait Pattern/deviations: Step-through pattern Gait velocity: decreased Gait velocity interpretation: 1.31 - 2.62 ft/sec, indicative of limited community ambulator   General Gait Details: pt ambulated 20' on RA with O2 sats dropping to 80%, HR 106 bpm. Pt then ambulated on 8L O2 with sats dropping to 89%, HR 101 bpm with 1 seated rest break at 50'. No gait abnormalities noted. Pt able to change directions quickly and navigate obstacles. Has difficulty changing pace due to O2 demands  Stairs            Wheelchair Mobility    Modified Rankin (Stroke Patients Only)       Balance Overall balance assessment: Modified Independent  Pertinent Vitals/Pain Pain Assessment Pain Assessment: Faces Faces Pain Scale: Hurts even more Pain Location: RUE Pain Descriptors / Indicators: Stabbing Pain Intervention(s): Limited activity within patient's tolerance, Monitored during session    Home Living Family/patient expects to be discharged to:: Private residence Living Arrangements: Parent Available Help at Discharge:  Family;Available 24 hours/day Type of Home: House Home Access: Stairs to enter   [employer] of Steps: 2   Home Layout: One level Home Equipment: None      Prior Function Prior Level of Function : Independent/Modified Independent             Mobility Comments: no AD use. Does not drive but goes out to eat and run errands with father       Hand Dominance        Extremity/Trunk Assessment   Upper Extremity Assessment Upper Extremity Assessment: Defer to OT evaluation    Lower Extremity Assessment Lower Extremity Assessment: Generalized weakness    Cervical / Trunk Assessment Cervical / Trunk Assessment: Normal  Communication   Communication: No difficulties  Cognition Arousal/Alertness: Awake/alert Behavior During Therapy: WFL for tasks assessed/performed Overall Cognitive Status: Within Functional Limits for tasks assessed                                          General Comments General comments (skin integrity, edema, etc.): Pt on 7 L HFNC with SPO2 98% before session    Exercises     Assessment/Plan    PT Assessment Patient needs continued PT services  PT Problem List Cardiopulmonary status limiting activity;Decreased mobility;Decreased strength;Pain       PT Treatment Interventions Gait training;Stair training;Functional mobility training;Therapeutic activities;Therapeutic exercise;Patient/family education    PT Goals (Current goals can be found in the Care Plan section)  Acute Rehab PT Goals Patient Stated Goal: be able to get breathing under control PT Goal Formulation: With patient Time For Goal Achievement: 04/01/22 Potential to Achieve Goals: Fair    Frequency Min 3X/week     Co-evaluation               AM-PAC PT "6 Clicks" Mobility  Outcome Measure Help needed turning from your back to your side while in a flat bed without using bedrails?: None Help needed moving from lying on your back to sitting  on the side of a flat bed without using bedrails?: None Help needed moving to and from a bed to a chair (including a wheelchair)?: None Help needed standing up from a chair using your arms (e.g., wheelchair or bedside chair)?: None Help needed to walk in hospital room?: A Little Help needed climbing 3-5 steps with a railing? : A Little 6 Click Score: 22    End of Session Equipment Utilized During Treatment: Oxygen Activity Tolerance: Treatment limited secondary to medical complications (Comment) (SPO2 dropping) Patient left: in bed;with call bell/phone within reach;with family/visitor present Nurse Communication: Mobility status PT Visit Diagnosis: Difficulty in walking, not elsewhere classified (R26.2)    Time: 2119-4174 PT Time Calculation (min) (ACUTE ONLY): 27 min   Charges:   PT Evaluation $PT Eval Moderate Complexity: 1 Mod PT Treatments $Gait Training: 8-22 mins        Leighton Roach, PT  Acute Rehab Services Secure chat preferred Office [place] 03/18/2022, 4:04 PM

## 2022-03-18 NOTE — Progress Notes (Signed)
PROGRESS NOTE  Thang Flett CNO:[id number] DOB: [birth year]   PCP: Patient, No Pcp Per  Patient is from: Home.  DOA: 03/15/2022 LOS: 2  Chief complaints Chief Complaint  Patient presents with   Chest Pain     Brief Narrative / Interim history: 61 year old M with PMH of HIV, hep B, NSCLC with widespread mets s/p palliative radiation and chemo now on immunotherapy, prior tobacco use and cocaine use presenting with chest pain and DOE, and admitted for non-STEMI and acute respiratory failure with hypoxia.  Desaturated to 80s requiring 6 L to recover to 90s.  Troponin trended from 1200-6400.  UDS positive for cocaine.  CTA chest showed progressive thoracic malignancy with increased masslike consolidation in left upper lobe, progressive lymphogenic spread of tumor throughout the left lung, a large malignant left pleural effusion, enlarged metastatic pulmonary nodules in right lung and osseous metastasis to the right scapula.  [facility] cardiology consulted for non-STEMI and recommended conservative management.  Oncology consulted for lung cancer.  Pulmonology consulted and did not feel there is utility to thoracocentesis since most of his left lung is tumor tissue, and recommended hospice.  Palliative medicine recommended DNR/DNI without escalation of care. Currently requiring 6 L by HFNC.   Subjective: Seen and examined earlier this morning.  No major events overnight of this morning.  Still with significant dyspnea on exertion.  Requiring several liters to maintain saturation in low 90s.  Denies chest pain, GI or UTI symptoms.  Objective: Vitals:   03/18/22 1000 03/18/22 1148 03/18/22 1300 03/18/22 1510  BP:  106/63  104/73  Pulse:  98  96  Resp: 20 (!) 22 19 (!) 24  Temp:  98.2 F (36.8 C)  98.5 F (36.9 C)  TempSrc:  Oral  Oral  SpO2:  96%  93%  Weight:      Height:        Examination:  GENERAL: Appears frail and chronically ill. HEENT: MMM.  Vision and hearing grossly intact.   NECK: Supple.  No apparent JVD.  RESP:  No IWOB.  Fair aeration bilaterally but very rhonchorous. CVS:  RRR. Heart sounds normal.  ABD/GI/GU: BS+. Abd soft, NTND.  MSK/EXT:  Moves extremities.  Significant muscle mass and subcu fat loss. SKIN: no apparent skin lesion or wound NEURO: Awake and alert. Oriented appropriately.  No apparent focal neuro deficit. PSYCH: Calm. Normal affect.       Procedures:  None  Microbiology summarized: None  Assessment and plan: Principal Problem:   NSTEMI (non-ST elevated myocardial infarction) ([place]) Active Problems:   Acute respiratory failure with hypoxia (HCC)   Non-small cell lung cancer (HCC)   Leukocytosis   HIV disease (HCC)   Microcytic hypochromic anemia   Thrombocytosis   Protein-calorie malnutrition, severe (HCC)   Elevated troponin I level   Precordial pain   Aortic atherosclerosis (HCC)   Coronary atherosclerosis due to calcified coronary lesion   Cocaine use   Metastatic non-small cell lung cancer (HCC)   Pressure injury of skin   Shortness of breath  Acute respiratory failure with hypoxia likely due to lung cancer: Chest x-ray and CT reviewed as above.  Unfortunately, this is going to be a progressive issue.  Low suspicion for infection with negative procalcitonin.  Currently requiring 7 L. -Appreciate input by PCCM-recommended considering hospice.  -On Demadex 10 mg daily per cardiology -Incentive spirometry, OOB and wean off oxygen as able. -Appreciate palliative care recommendation-DNR/DNI and no escalation of care  Non-STEMI/elevated troponin/chest pain: In the setting  of cocaine use and hypoxemia.  Troponin peaked at 6234 and trended down.  BNP normal.  TTE with LVEF of 60 to 65%, G1 DD, moderate LAE, small to moderate pericardial effusion.  CTA chest as above.  Chest pain could partly be due to malignancy. -Cardiology following-recommended medical management  NSCLC with widespread metastasis s/p palliative  radiation, chemo and currently on immunotherapy. -Appreciate input by oncology -Palliative medicine following.  HIV/AIDS -Continue home Biktarvy  Cocaine use: UDS positive but patient denies use. -Encouraged to refrain  Goal of care counseling: Palliative met with patient.  CODE STATUS changed to DNR/DNI.  Physical deconditioning/generalized weakness -PT/OT  Leukocytosis/thrombocytosis: Reactive.  Severe malnutrition: As evidenced by low BMI and significant muscle mass and subcu fat loss. Body mass index is 16.07 kg/m.  Pressure skin injury: POA Pressure Injury 03/15/22 Sacrum Right;Left Stage 2 -  Partial thickness loss of dermis presenting as a shallow open injury with a red, pink wound bed without slough. (Active)  03/15/22 1628  Location: Sacrum  Location Orientation: Right;Left  Staging: Stage 2 -  Partial thickness loss of dermis presenting as a shallow open injury with a red, pink wound bed without slough.  Wound Description (Comments):   Present on Admission: Yes  Dressing Type Foam - Lift dressing to assess site every shift 03/18/22 0755   DVT prophylaxis:  enoxaparin (LOVENOX) injection 40 mg Start: 03/18/22 1000  Code Status: DNR/DNI Family Communication: None at bedside. Level of care: Telemetry Medical Status is: Inpatient Remains inpatient appropriate because: Acute respiratory failure with hypoxia   Final disposition: Home with home health? Consultants:  Oncology-signed off PCCM-signed off Cardiology-signed off Palliative medicine following  Sch Meds:  Scheduled Meds:  (feeding supplement) PROSource Plus  30 mL Oral TID BM   aspirin  81 mg Oral Daily   atorvastatin  40 mg Oral QHS   bictegravir-emtricitabine-tenofovir AF  1 tablet Oral Daily   clopidogrel  75 mg Oral Daily   diltiazem  30 mg Oral Q6H   enoxaparin (LOVENOX) injection  40 mg Subcutaneous Q24H   feeding supplement  237 mL Oral TID BM   multivitamin with minerals  1 tablet Oral  Daily   sodium chloride flush  3 mL Intravenous Q12H   torsemide  10 mg Oral QODAY   Continuous Infusions: PRN Meds:.acetaminophen **OR** acetaminophen, albuterol, benzonatate, HYDROcodone bit-homatropine, morphine injection, ondansetron **OR** ondansetron (ZOFRAN) IV, oxyCODONE  Antimicrobials: Anti-infectives (From admission, onward)    Start     Dose/Rate Route Frequency Ordered Stop   03/15/22 2345  bictegravir-emtricitabine-tenofovir AF (BIKTARVY) 50-200-25 MG per tablet 1 tablet        1 tablet Oral Daily 03/15/22 2247          I have personally reviewed the following labs and images: CBC: Recent Labs  Lab 03/13/22 1158 03/15/22 0153 03/16/22 0044 03/17/22 0015 03/18/22 0046  WBC 12.6* 12.4* 10.7* 11.7* 11.5*  NEUTROABS 9.6*  --   --   --   --   HGB 11.7* 11.1* 10.3* 10.8* 11.1*  HCT 37.7* 36.0* 32.1* 35.1* 35.3*  MCV 78.9* 79.8* 76.8* 78.0* 77.4*  PLT 656* 539* 543* 604* 588*   BMP &GFR Recent Labs  Lab 03/13/22 1158 03/15/22 0153 03/16/22 0044 03/17/22 0015  NA 141 135 138 136  K 4.2 3.9 3.6 3.8  CL 106 99 102 99  CO2 30 26 27 30   GLUCOSE 100* 121* 124* 99  BUN 16 13 11 12   CREATININE 0.64 0.82 0.85 0.80  CALCIUM 8.4* 8.2* 8.0* 7.8*  MG  --   --   --  1.8  PHOS  --   --   --  2.8   Estimated Creatinine Clearance: 69.7 mL/min (by C-G formula based on SCr of 0.8 mg/dL). Liver & Pancreas: Recent Labs  Lab 03/13/22 1158 03/17/22 0015  AST 23  --   ALT 14  --   ALKPHOS 77  --   BILITOT 0.3  --   PROT 7.0  --   ALBUMIN 2.6* 1.6*   No results for input(s): "LIPASE", "AMYLASE" in the last 168 hours. No results for input(s): "AMMONIA" in the last 168 hours. Diabetic: No results for input(s): "HGBA1C" in the last 72 hours. No results for input(s): "GLUCAP" in the last 168 hours. Cardiac Enzymes: No results for input(s): "CKTOTAL", "CKMB", "CKMBINDEX", "TROPONINI" in the last 168 hours. No results for input(s): "PROBNP" in the last 8760  hours. Coagulation Profile: No results for input(s): "INR", "PROTIME" in the last 168 hours. Thyroid Function Tests: No results for input(s): "TSH", "T4TOTAL", "FREET4", "T3FREE", "THYROIDAB" in the last 72 hours. Lipid Profile: No results for input(s): "CHOL", "HDL", "LDLCALC", "TRIG", "CHOLHDL", "LDLDIRECT" in the last 72 hours. Anemia Panel: No results for input(s): "VITAMINB12", "FOLATE", "FERRITIN", "TIBC", "IRON", "RETICCTPCT" in the last 72 hours. Urine analysis:    Component Value Date/Time   COLORURINE YELLOW 10/13/2020 2351   APPEARANCEUR CLEAR 10/13/2020 2351   LABSPEC 1.018 10/13/2020 2351   PHURINE 6.0 10/13/2020 2351   GLUCOSEU NEGATIVE 10/13/2020 2351   HGBUR NEGATIVE 10/13/2020 2351   BILIRUBINUR NEGATIVE 10/13/2020 2351   [place] 10/13/2020 2351   PROTEINUR NEGATIVE 10/13/2020 2351   UROBILINOGEN 0.2 07/06/2013 0453   NITRITE NEGATIVE 10/13/2020 2351   LEUKOCYTESUR TRACE (A) 10/13/2020 2351   Sepsis Labs: Invalid input(s): "PROCALCITONIN", "LACTICIDVEN"  Microbiology: No results found for this or any previous visit (from the past 240 hour(s)).  Radiology Studies: No results found.    Filemon Breton T. [place]  If 7PM-7AM, please contact night-coverage [email or web address] 03/18/2022, 3:59 PM

## 2022-03-18 NOTE — Progress Notes (Signed)
2307: CP 11/10, BP106/67 HR 102, nitro given x1 at 2311  2316: CP 9/10 BP 61/44 HR 102, no nitor given

## 2022-03-18 NOTE — Evaluation (Signed)
Occupational Therapy Evaluation Patient Details Name: Steven Johnston MRN: [id number] DOB: [birth year] Today's Date: 03/18/2022   History of Present Illness Pt is a 61 y/o M presenting to ED on 10/14 with chest pain, admitted for NSTEMI and acute respiratory failure with hypoxia. PMH includes HIV, Hep B, poorly differentiated metastatic non-small cell lung cancer with bone and soft tissue involvement s/p palliative radiation and chemotherapy, prior tobacco and cocaine use   Clinical Impression   Pt reports independence at baseline with ADLs and functional mobility, lives with family who can assist at d/c. Pt needing min guard-min A for ADLs, min guard for bed mobility and transfers without AD. VSS on 8L O2 throughout session as tank does not have 7L option. Pt presenting with impairments listed below, will follow acutely. Recommend HHOT at d/c.      Recommendations for follow up therapy are one component of a multi-disciplinary discharge planning process, led by the attending physician.  Recommendations may be updated based on patient status, additional functional criteria and insurance authorization.   Follow Up Recommendations  Home health OT    Assistance Recommended at Discharge Set up Supervision/Assistance  Patient can return home with the following A little help with walking and/or transfers;A little help with bathing/dressing/bathroom;Assistance with cooking/housework;Assist for transportation;Help with stairs or ramp for entrance    Functional Status Assessment  Patient has had a recent decline in their functional status and demonstrates the ability to make significant improvements in function in a reasonable and predictable amount of time.  Equipment Recommendations  BSC/3in1    Recommendations for Other Services PT consult     Precautions / Restrictions Precautions Precautions: Fall Precaution Comments: watch O2 and BP Restrictions Weight Bearing Restrictions: No       Mobility Bed Mobility Overal bed mobility: Needs Assistance Bed Mobility: Supine to Sit     Supine to sit: Min guard     General bed mobility comments: increased time    Transfers Overall transfer level: Needs assistance Equipment used: None Transfers: Sit to/from Stand Sit to Stand: Min guard           General transfer comment: min guard A for in room ambulation, pt managing O2 tank      Balance Overall balance assessment: Mild deficits observed, not formally tested                                         ADL either performed or assessed with clinical judgement   ADL Overall ADL's : Needs assistance/impaired Eating/Feeding: Modified independent   Grooming: Min guard   Upper Body Bathing: Minimal assistance   Lower Body Bathing: Minimal assistance   Upper Body Dressing : Min guard   Lower Body Dressing: Min guard;Sitting/lateral leans   Toilet Transfer: Nature conservation officer;Ambulation   Toileting- Clothing Manipulation and Hygiene: Min guard       Functional mobility during ADLs: Min guard       Vision   Vision Assessment?: No apparent visual deficits     Perception     Praxis      Pertinent Vitals/Pain Pain Assessment Pain Assessment: No/denies pain     Hand Dominance     Extremity/Trunk Assessment Upper Extremity Assessment Upper Extremity Assessment: Generalized weakness   Lower Extremity Assessment Lower Extremity Assessment: Defer to PT evaluation   Cervical / Trunk Assessment Cervical / Trunk Assessment: Normal   Communication Communication  Communication: No difficulties   Cognition Arousal/Alertness: Awake/alert Behavior During Therapy: WFL for tasks assessed/performed Overall Cognitive Status: Within Functional Limits for tasks assessed                                       General Comments  VSS on 8L O2 (O2 tank does not have 7L O2 option)    Exercises     Shoulder  Instructions      Home Living Family/patient expects to be discharged to:: Private residence Living Arrangements: Parent Available Help at Discharge: Family;Available 24 hours/day Type of Home: House Home Access: Stairs to enter [employer] of Steps: 2   Home Layout: One level     Bathroom Shower/Tub: Tub/shower unit         Home Equipment: None          Prior Functioning/Environment Prior Level of Function : Independent/Modified Independent             Mobility Comments: no AD use          OT Problem List: Decreased strength;Decreased range of motion;Decreased activity tolerance;Impaired balance (sitting and/or standing)      OT Treatment/Interventions: Self-care/ADL training;Therapeutic exercise;DME and/or AE instruction;Energy conservation;Therapeutic activities;Patient/family education;Balance training    OT Goals(Current goals can be found in the care plan section) Acute Rehab OT Goals Patient Stated Goal: none stated OT Goal Formulation: With patient Time For Goal Achievement: 04/01/22 Potential to Achieve Goals: Good ADL Goals Pt Will Perform Upper Body Dressing: Independently;sitting Pt Will Perform Lower Body Dressing: Independently;sit to/from stand Pt Will Transfer to Toilet: Independently;ambulating;regular height toilet Pt Will Perform Tub/Shower Transfer: Tub transfer;Shower transfer;Independently;ambulating Additional ADL Goal #1: pt will verbalize 3 energy conservation strategies in prep for ADLs  OT Frequency: Min 2X/week    Co-evaluation              AM-PAC OT "6 Clicks" Daily Activity     Outcome Measure Help from another person eating meals?: None Help from another person taking care of personal grooming?: A Little Help from another person toileting, which includes using toliet, bedpan, or urinal?: A Little Help from another person bathing (including washing, rinsing, drying)?: A Little Help from another person to put  on and taking off regular upper body clothing?: A Little Help from another person to put on and taking off regular lower body clothing?: A Little 6 Click Score: 19   End of Session Equipment Utilized During Treatment: Gait belt;Oxygen Nurse Communication: Mobility status  Activity Tolerance: Patient tolerated treatment well Patient left: in chair;with call bell/phone within reach;with chair alarm set  OT Visit Diagnosis: Unsteadiness on feet (R26.81);Other abnormalities of gait and mobility (R26.89);Muscle weakness (generalized) (M62.81)                Time: 3094-0768 OT Time Calculation (min): 30 min Charges:  OT General Charges $OT Visit: 1 Visit OT Evaluation $OT Eval Moderate Complexity: 1 Mod OT Treatments $Self Care/Home Management : 8-22 mins  Lynnda Child, OTD, OTR/L Acute Rehab [phone] - [place] 03/18/2022, 12:33 PM

## 2022-03-18 NOTE — Progress Notes (Signed)
Daily Progress Note   Patient Name: Steven Johnston       Date: 03/18/2022 DOB: 1960/09/01  Age: 61 y.o. MRN#: [id number] Attending Physician: Mercy Riding, MD Primary Care Physician: Patient, No Pcp Per Admit Date: 03/15/2022  Reason for Consultation/Follow-up: Establishing goals of care  Subjective: Medical records reviewed including progress notes, labs. Patient assessed at the bedside. He is feeling about the same, disappointed he can't go home yet and glad to have a regular diet. His brother and mother are present visiting.  Followed up on patient's review of MOST form and overall goals of care.  He tells me he will be ready for sure by tomorrow.  I informed him that I would not be back on service until Saturday 10/21 and offered to ask a colleague to follow-up in the meantime to assist him with completion.  He states in that case, he will wait until I return and continue reviewing the form on his own.  Provided hard choices for loving people booklet for his mother.  Questions and concerns addressed. PMT will continue to support holistically.   Length of Stay: 2   Physical Exam Vitals and nursing note reviewed.  Constitutional:      Interventions: Nasal cannula in place.     Comments: 8L  Cardiovascular:     Rate and Rhythm: Normal rate.  Pulmonary:     Effort: Pulmonary effort is normal. No respiratory distress.  Neurological:     Mental Status: He is alert and oriented to person, place, and time.  Psychiatric:        Mood and Affect: Mood normal.        Behavior: Behavior normal.            Vital Signs: BP 106/63 (BP Location: Left Arm)   Pulse 98   Temp 98.2 F (36.8 C) (Oral)   Resp (!) 22   Ht 5\' 10"  (1.778 m)   Wt 50.8 kg   SpO2 96%   BMI 16.07 kg/m  SpO2:  SpO2: 96 % O2 Device: O2 Device: High Flow Nasal Cannula O2 Flow Rate: O2 Flow Rate (L/min): 6 L/min      Palliative Assessment/Data: 50%   Palliative Care Assessment & Plan   Patient Profile: 61 y.o. male  with past medical history of HIV, hepatitis B,  poorly differentiated metastatic non-small cell lung cancer with bone and soft tissue involvement s/p palliative radiation and chemotherapy, and prior tobacco abuse admitted on 03/15/2022 with chest pain.    Patient had just recently been hospitalized 9/6-9/8 with complaints of worsening cough and right shoulder pain.  Found to  metastatic disease with biopsy of the left supraclavicular noduleconglomeration bx showing non small cell carcinoma.   Patient now admitted with NSTEMI and CT angiogram of the chest obtained noting progressive malignancy, enlarging malignant left pleural effusion.  PMT has been consulted to assist with goals of care conversation.    Assessment: Goals of care conversation NSTEMI Stage IV metastatic lung cancer HIV  Recommendations/Plan: Continue DNR Continue current care without further escalation Patient will continue to review MOST form Psychosocial and emotional support provided PMT will follow up on 10/21 if still admitted. Patient is also followed by outpatient palliative care at [facility]   Prognosis: < 6 months would not be surprising, hospice appropriate when aligned with patient goals of care   Discharge Planning: To Be Determined   Care plan was discussed with patient, patient's mother, and patient's brother   MDM high         Elaina Cara Johnnette Litter, PA-C  Palliative Medicine Team Team phone # [phone]  Thank you for allowing the Palliative Medicine Team to assist in the care of this patient. Please utilize secure chat with additional questions, if there is no response within 30 minutes please call the above phone number.  Palliative Medicine Team providers are available by phone from  7am to 7pm daily and can be reached through the team cell phone.  Should this patient require assistance outside of these hours, please call the patient's attending physician.

## 2022-03-18 NOTE — Code Documentation (Signed)
I was notified by nursing staff of pt with CP 10/10 and STE in inferior leads. Dr. Bridgett Larsson and Dr. Einar Gip notified. Code stemi activated, then cancelled by Dr. Einar Gip. Pt given SL NTG and Morphine IV for pain. NS 250cc bolus given for a brief episode of hypotension in the 60s. Dr. Alfred Levins came to bedside.   2324-98.36F, 98 SR, 98/64, RR 18 with sats 92% on 5L [place].

## 2022-03-18 NOTE — Progress Notes (Signed)
  X-cover Note: Called by RN regarding EKG. Computer read out "STEMI". Pt with chest pain. Given IV morphine. Still having chest pain. RN reports that pt wants his heart attack treated.  Was admitted on 03-15-2022 for NSTEMI. He is a DNR but not comfort care. Pt with metastatic lung cancer.  IV morphine 4 mg ordered.  RN is paging STEMI cardiologist.  Today's EKG    Admission EKG   Steven Oppenheim, DO [facility]

## 2022-03-18 NOTE — Progress Notes (Signed)
   03/18/22 2316  Clinical Encounter Type  Visited With Patient not available  Visit Type Initial;Critical Care  Referral From Nurse  Consult/Referral To Chaplain   [place] responded to the page. The patient is being attended to by the medical team. RN confirmed there is no family present. CH remains available for follow up support as needed. This note was prepared by Jeanine Luz, M.Div..  For questions please contact by phone [phone].

## 2022-03-19 DIAGNOSIS — I214 Non-ST elevation (NSTEMI) myocardial infarction: Secondary | ICD-10-CM | POA: Diagnosis not present

## 2022-03-19 DIAGNOSIS — I2584 Coronary atherosclerosis due to calcified coronary lesion: Secondary | ICD-10-CM | POA: Diagnosis not present

## 2022-03-19 DIAGNOSIS — I2510 Atherosclerotic heart disease of native coronary artery without angina pectoris: Secondary | ICD-10-CM | POA: Diagnosis not present

## 2022-03-19 DIAGNOSIS — I700 Atherosclerosis of aorta: Secondary | ICD-10-CM | POA: Diagnosis not present

## 2022-03-19 LAB — CBC
HCT: 33.9 % — ABNORMAL LOW (ref 39.0–52.0)
Hemoglobin: 10.3 g/dL — ABNORMAL LOW (ref 13.0–17.0)
MCH: 24 pg — ABNORMAL LOW (ref 26.0–34.0)
MCHC: 30.4 g/dL (ref 30.0–36.0)
MCV: 78.8 fL — ABNORMAL LOW (ref 80.0–100.0)
Platelets: 617 10*3/uL — ABNORMAL HIGH (ref 150–400)
RBC: 4.3 MIL/uL (ref 4.22–5.81)
RDW: 14.4 % (ref 11.5–15.5)
WBC: 14.1 10*3/uL — ABNORMAL HIGH (ref 4.0–10.5)
nRBC: 0 % (ref 0.0–0.2)

## 2022-03-19 NOTE — Progress Notes (Signed)
Progress Note   Patient: Steven Johnston WEX:[id number] DOB: 07-11-1960 DOA: 03/15/2022     3 DOS: the patient was seen and examined on 03/19/2022   Brief hospital course: 61 year old M with PMH of HIV, hep B, NSCLC with widespread mets s/p palliative radiation and chemo now on immunotherapy, prior tobacco use and cocaine use presenting with chest pain and DOE, and admitted for non-STEMI and acute respiratory failure with hypoxia.  Desaturated to 80s requiring 6 L to recover to 90s.  Troponin trended from 1200-6400.  UDS positive for cocaine.  CTA chest showed progressive thoracic malignancy with increased masslike consolidation in left upper lobe, progressive lymphogenic spread of tumor throughout the left lung, a large malignant left pleural effusion, enlarged metastatic pulmonary nodules in right lung and osseous metastasis to the right scapula.  [facility] cardiology consulted for non-STEMI and recommended conservative management.  Oncology consulted for lung cancer.  Pulmonology consulted and did not feel there is utility to thoracocentesis since most of his left lung is tumor tissue, and recommended hospice.  Palliative medicine recommended DNR/DNI without escalation of care. Currently requiring 6 L by HFNC.    Assessment and Plan: Acute respiratory failure with hypoxia likely due to lung cancer: Chest x-ray and CT reviewed as above.  Unfortunately, this is going to be a progressive issue.  Low suspicion for infection with negative procalcitonin.  -Appreciate input by PCCM-recommended considering hospice.  -On Demadex 10 mg daily per cardiology -Incentive spirometry, OOB and wean off oxygen as able, weaned down to 3L this afternoon -Appreciate palliative care recommendation-DNR/DNI and no escalation of care   Initial Non-STEMI/elevated troponin/chest pain, later STEMI: In the setting of cocaine use and hypoxemia.  Troponin peaked at 6234 and trended down.  BNP normal.  TTE with LVEF of 60 to 65%,  G1 DD, moderate LAE, small to moderate pericardial effusion.  CTA chest as above.  Chest pain could partly be due to malignancy. -Cardiology following. See overnight events. Pt later ruled in for STEMI on evening of 10/17, seen by Cardiology. Recs for cont medical mgt. If pt continues to be in severe pain, then may consider cath at that time -Pt denies further chest pains this AM   NSCLC with widespread metastasis s/p palliative radiation, chemo and currently on immunotherapy. -Appreciate input by oncology, recs for continued immunotherapy -Palliative medicine following.   HIV/AIDS -Continue home Biktarvy   Cocaine use: UDS positive but patient denies use. -Encouraged to refrain   Goal of care counseling: Palliative met with patient.  CODE STATUS changed to DNR/DNI.   Physical deconditioning/generalized weakness -PT/OT   Leukocytosis/thrombocytosis: Reactive.   Severe malnutrition: As evidenced by low BMI and significant muscle mass and subcu fat loss. Body mass index is 16.07 kg/m.   Pressure skin injury: POA     Pressure Injury 03/15/22 Sacrum Right;Left Stage 2 -  Partial thickness loss of dermis presenting as a shallow open injury with a red, pink wound bed without slough. (Active)  03/15/22 1628  Location: Sacrum  Location Orientation: Right;Left  Staging: Stage 2 -  Partial thickness loss of dermis presenting as a shallow open injury with a red, pink wound bed without slough.  Wound Description (Comments):   Present on Admission: Yes  Dressing Type Foam - Lift dressing to assess site every shift 03/18/22 0755       Subjective: Chest pains noted overnight, none currently. Reports still having sob  Physical Exam: Vitals:   03/19/22 0715 03/19/22 0838 03/19/22 1137 03/19/22 1230  BP: 106/65  96/69 95/62  Pulse: 92  83   Resp: 20  20   Temp: (!) 97.4 F (36.3 C)  97.7 F (36.5 C)   TempSrc: Oral  Oral   SpO2: 94% 96% 93%   Weight:      Height:       General  exam: Awake, laying in bed, in nad Respiratory system: Normal respiratory effort, no wheezing Cardiovascular system: regular rate, s1, s2 Gastrointestinal system: Soft, nondistended, positive BS Central nervous system: CN2-12 grossly intact, strength intact Extremities: Perfused, no clubbing Skin: Normal skin turgor, no notable skin lesions seen Psychiatry: Mood normal // no visual hallucinations   Data Reviewed:  Labs reviewed: hgb 10.3, WBC 14.1, Plts 617   Family Communication: Pt in room, family not at bedside  Disposition: Status is: Inpatient Remains inpatient appropriate because: Severity of illness  Planned Discharge Destination: Home    Author: Marylu Lund, MD 03/19/2022 2:06 PM  For on call review [email or web address].

## 2022-03-20 DIAGNOSIS — F1490 Cocaine use, unspecified, uncomplicated: Secondary | ICD-10-CM | POA: Diagnosis not present

## 2022-03-20 DIAGNOSIS — I2510 Atherosclerotic heart disease of native coronary artery without angina pectoris: Secondary | ICD-10-CM | POA: Diagnosis not present

## 2022-03-20 DIAGNOSIS — J9601 Acute respiratory failure with hypoxia: Secondary | ICD-10-CM | POA: Diagnosis not present

## 2022-03-20 DIAGNOSIS — I214 Non-ST elevation (NSTEMI) myocardial infarction: Secondary | ICD-10-CM | POA: Diagnosis not present

## 2022-03-20 LAB — COMPREHENSIVE METABOLIC PANEL
ALT: 18 U/L (ref 0–44)
AST: 52 U/L — ABNORMAL HIGH (ref 15–41)
Albumin: <1.5 g/dL — ABNORMAL LOW (ref 3.5–5.0)
Alkaline Phosphatase: 92 U/L (ref 38–126)
Anion gap: 7 (ref 5–15)
BUN: 16 mg/dL (ref 8–23)
CO2: 29 mmol/L (ref 22–32)
Calcium: 7.9 mg/dL — ABNORMAL LOW (ref 8.9–10.3)
Chloride: 100 mmol/L (ref 98–111)
Creatinine, Ser: 0.84 mg/dL (ref 0.61–1.24)
GFR, Estimated: >60 mL/min (ref >60)
Glucose, Bld: 118 mg/dL — ABNORMAL HIGH (ref 70–99)
Potassium: 4.3 mmol/L (ref 3.5–5.1)
Sodium: 136 mmol/L (ref 135–145)
Total Bilirubin: 0.5 mg/dL (ref 0.3–1.2)
Total Protein: 5.6 g/dL — ABNORMAL LOW (ref 6.5–8.1)

## 2022-03-20 LAB — CBC
HCT: 31.7 % — ABNORMAL LOW (ref 39.0–52.0)
Hemoglobin: 9.7 g/dL — ABNORMAL LOW (ref 13.0–17.0)
MCH: 24.1 pg — ABNORMAL LOW (ref 26.0–34.0)
MCHC: 30.6 g/dL (ref 30.0–36.0)
MCV: 78.7 fL — ABNORMAL LOW (ref 80.0–100.0)
Platelets: 660 10*3/uL — ABNORMAL HIGH (ref 150–400)
RBC: 4.03 MIL/uL — ABNORMAL LOW (ref 4.22–5.81)
RDW: 14.3 % (ref 11.5–15.5)
WBC: 11.8 10*3/uL — ABNORMAL HIGH (ref 4.0–10.5)
nRBC: 0 % (ref 0.0–0.2)

## 2022-03-20 MED ORDER — LABETALOL HCL 100 MG PO TABS
50.0000 mg | ORAL_TABLET | Freq: Two times a day (BID) | ORAL | Status: DC
  Filled 2022-03-20 (×2): qty 1

## 2022-03-20 NOTE — Progress Notes (Signed)
Occupational Therapy Treatment Patient Details Name: Steven Johnston MRN: [id number] DOB: Apr 24, 1961 Today's Date: 03/20/2022   History of present illness Pt is a 61 y/o male admitted 03/15/21 with chest pain, admitted for NSTEMI and acute respiratory failure with hypoxia. Code STEMI called 10/17; per cardiology, do not currently recommend cardiac cath at this time due to medical comorbidities. 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   OT comments  Pt is making progress in session as was able to complete activity on 3L with lowest reading at 86% with seated rest breaks and cues on breathing education. Pt declined to complete standing ADLS at this time as reported "I did that yesterday". Pt currently with functional limitations due to the deficits listed below (see OT Problem List).  Pt will benefit from skilled OT to increase their safety and independence with ADL and functional mobility for ADL to facilitate discharge to venue listed below.     Recommendations for follow up therapy are one component of a multi-disciplinary discharge planning process, led by the attending physician.  Recommendations may be updated based on patient status, additional functional criteria and insurance authorization.    Follow Up Recommendations  No OT follow up    Assistance Recommended at Discharge Set up Supervision/Assistance  Patient can return home with the following  A little help with walking and/or transfers;A little help with bathing/dressing/bathroom;Assistance with cooking/housework;Assist for transportation;Help with stairs or ramp for entrance   Equipment Recommendations  BSC/3in1    Recommendations for Other Services      Precautions / Restrictions Precautions Precautions: Fall Precaution Comments: watch O2 and BP Restrictions Weight  Bearing Restrictions: No       Mobility Bed Mobility Overal bed mobility:  (Present OOB)                  Transfers Overall transfer level: Needs assistance Equipment used: None Transfers: Sit to/from Stand Sit to Stand: Min guard                 Balance Overall balance assessment: Modified Independent                                         ADL either performed or assessed with clinical judgement   ADL Overall ADL's : Needs assistance/impaired Eating/Feeding: Modified independent   Grooming: Min guard   Upper Body Bathing: Set up;Sitting   Lower Body Bathing: Min guard;Sit to/from stand   Upper Body Dressing : Set up;Sitting   Lower Body Dressing: Min guard;Sit to/from stand;Cueing for safety;Cueing for sequencing   Toilet Transfer: Min guard;Cueing for safety;Cueing for sequencing   Toileting- Water quality scientist and Hygiene: Min guard;Sit to/from stand       Functional mobility during ADLs: Min guard      Extremity/Trunk Assessment Upper Extremity Assessment Upper Extremity Assessment: RUE deficits/detail RUE Deficits / Details: Per pt chronic issues due hx ca limited strength/ROM RUE: Unable to fully assess due to pain   Lower Extremity Assessment Lower Extremity Assessment: Defer to PT evaluation        Vision   Vision Assessment?: No apparent visual deficits   Perception     Praxis      Cognition Arousal/Alertness: Awake/alert Behavior During Therapy: WFL for tasks assessed/performed Overall Cognitive Status: Within  Functional Limits for tasks assessed                                          Exercises      Shoulder Instructions       General Comments Pt on 3L and lowest o2 reading at 85% with activity and needed cues on breathing techniques    Pertinent Vitals/ Pain       Pain Assessment Pain Assessment: Faces Faces Pain Scale: Hurts even more Pain Location: RUE Pain Descriptors  / Indicators: Grimacing, Guarding Pain Intervention(s): Limited activity within patient's tolerance, Monitored during session, Repositioned  Home Living                                          Prior Functioning/Environment              Frequency  Min 2X/week        Progress Toward Goals  OT Goals(current goals can now be found in the care plan section)  Progress towards OT goals: Progressing toward goals  Acute Rehab OT Goals Patient Stated Goal: to breath better OT Goal Formulation: With patient Time For Goal Achievement: 04/01/22 Potential to Achieve Goals: Good ADL Goals Pt Will Perform Upper Body Dressing: Independently;sitting Pt Will Perform Lower Body Dressing: Independently;sit to/from stand Pt Will Transfer to Toilet: Independently;ambulating;regular height toilet Pt Will Perform Tub/Shower Transfer: Tub transfer;Shower transfer;Independently;ambulating Additional ADL Goal #1: pt will verbalize 3 energy conservation strategies in prep for ADLs  Plan Discharge plan remains appropriate    Co-evaluation                 AM-PAC OT "6 Clicks" Daily Activity     Outcome Measure   Help from another person eating meals?: None Help from another person taking care of personal grooming?: A Little Help from another person toileting, which includes using toliet, bedpan, or urinal?: A Little Help from another person bathing (including washing, rinsing, drying)?: A Little Help from another person to put on and taking off regular upper body clothing?: A Little Help from another person to put on and taking off regular lower body clothing?: A Little 6 Click Score: 19    End of Session Equipment Utilized During Treatment: Gait belt;Oxygen  OT Visit Diagnosis: Unsteadiness on feet (R26.81);Other abnormalities of gait and mobility (R26.89);Muscle weakness (generalized) (M62.81)   Activity Tolerance Patient tolerated treatment well   Patient Left  in bed;with call bell/phone within reach   Nurse Communication          Time: 2671-2458 OT Time Calculation (min): 24 min  Charges: OT General Charges $OT Visit: 1 Visit OT Treatments $Self Care/Home Management : 23-37 mins  Joeseph Amor OTR/L  Acute Rehab Services  [phone] office number [phone] pager number   Joeseph Amor 03/20/2022, 11:53 AM

## 2022-03-20 NOTE — Progress Notes (Signed)
Progress Note   Patient: Steven Johnston TIR:[id number] DOB: 03-05-1961 DOA: 03/15/2022     4 DOS: the patient was seen and examined on 03/20/2022   Brief hospital course: 61 year old M with PMH of HIV, hep B, NSCLC with widespread mets s/p palliative radiation and chemo now on immunotherapy, prior tobacco use and cocaine use presenting with chest pain and DOE, and admitted for non-STEMI and acute respiratory failure with hypoxia.  Desaturated to 80s requiring 6 L to recover to 90s.  Troponin trended from 1200-6400.  UDS positive for cocaine.  CTA chest showed progressive thoracic malignancy with increased masslike consolidation in left upper lobe, progressive lymphogenic spread of tumor throughout the left lung, a large malignant left pleural effusion, enlarged metastatic pulmonary nodules in right lung and osseous metastasis to the right scapula.  [facility] cardiology consulted for non-STEMI and recommended conservative management.  Oncology consulted for lung cancer.  Pulmonology consulted and did not feel there is utility to thoracocentesis since most of his left lung is tumor tissue, and recommended hospice.  Palliative medicine recommended DNR/DNI without escalation of care. Currently requiring 6 L by HFNC.    Assessment and Plan: Acute respiratory failure with hypoxia likely due to lung cancer: Chest x-ray and CT reviewed as above.  Unfortunately, this is going to be a progressive issue.  Low suspicion for infection with negative procalcitonin.  -Appreciate input by PCCM-recommended considering hospice.  -On Demadex 10 mg daily per cardiology -Incentive spirometry, OOB and wean off oxygen as able, weaned down to [facility] thus far. Possible will need home o2 closer to d/c, would eval at that time -Appreciate palliative care recommendation-DNR/DNI and no escalation of care   Initial Non-STEMI/elevated troponin/chest pain, later STEMI: In the setting of cocaine use and hypoxemia.  Troponin peaked at  6234 and trended down.  BNP normal.  TTE with LVEF of 60 to 65%, G1 DD, moderate LAE, small to moderate pericardial effusion.  CTA chest as above.  Chest pain could partly be due to malignancy. -Cardiology following. See overnight events. Pt later ruled in for STEMI on evening of 10/17, seen by Cardiology. Recs for cont medical mgt. If pt continues to be in severe pain, then may consider cath at that time -Pt denies further chest pains this AM. Pt did report feeling better today   NSCLC with widespread metastasis s/p palliative radiation, chemo and currently on immunotherapy. -Appreciate input by oncology, recs for continued immunotherapy -Palliative medicine following.   HIV/AIDS -Continue home Biktarvy   Cocaine use: UDS positive but patient denies use. -Encouraged to refrain   Goal of care counseling: Palliative met with patient.  CODE STATUS changed to DNR/DNI.   Physical deconditioning/generalized weakness -PT/OT seen and evaluated. No PT/OT follow up was recommended   Leukocytosis/thrombocytosis: Reactive.   Severe malnutrition: As evidenced by low BMI and significant muscle mass and subcu fat loss. Body mass index is 16.07 kg/m.   Pressure skin injury: POA     Pressure Injury 03/15/22 Sacrum Right;Left Stage 2 -  Partial thickness loss of dermis presenting as a shallow open injury with a red, pink wound bed without slough. (Active)  03/15/22 1628  Location: Sacrum  Location Orientation: Right;Left  Staging: Stage 2 -  Partial thickness loss of dermis presenting as a shallow open injury with a red, pink wound bed without slough.  Wound Description (Comments):   Present on Admission: Yes  Dressing Type Foam - Lift dressing to assess site every shift 03/18/22 0755  Subjective: Chest pains noted overnight, none currently. Reports still having sob  Physical Exam: Vitals:   03/20/22 0033 03/20/22 0400 03/20/22 0815 03/20/22 1235  BP: (!) 93/56 (!) 76/55 91/64 97/68    Pulse: 94 92    Resp: 20 20    Temp: 98.4 F (36.9 C) 98.4 F (36.9 C)    TempSrc: Oral Oral    SpO2: 92% 91% 91% 92%  Weight:  54.1 kg    Height:       General exam: Conversant, in no acute distress Respiratory system: normal chest rise, clear, no audible wheezing Cardiovascular system: regular rhythm, s1-s2 Gastrointestinal system: Nondistended, nontender, pos BS Central nervous system: No seizures, no tremors Extremities: No cyanosis, no joint deformities Skin: No rashes, no pallor Psychiatry: Affect normal // no auditory hallucinations   Data Reviewed:  Labs reviewed: Na 136, K 4.3, Cr 0.84, WBC 11.8, Hgb 9.7   Family Communication: Pt in room, family not at bedside  Disposition: Status is: Inpatient Remains inpatient appropriate because: Severity of illness  Planned Discharge Destination: Home    Author: Marylu Lund, MD 03/20/2022 4:41 PM  For on call review [email or web address].

## 2022-03-20 NOTE — Progress Notes (Signed)
Physical Therapy Treatment Patient Details Name: Steven Johnston MRN: [id number] DOB: [birth year] Today's Date: 03/20/2022   History of Present Illness Pt is a 61 y/o male admitted 03/15/21 with chest pain, admitted for NSTEMI and acute respiratory failure with hypoxia. Code STEMI called 10/17; per cardiology, do not currently recommend cardiac cath at this time due to medical comorbidities. 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   PT Comments    Pt progressing with mobility. Today's session focused on ambulation for improving strength and activity tolerance; pt moving well without DME, but requires ~5-min sitting rest break to recover after walking 175' in hallway. Educ re: activity recommendations, activity pacing, energy conservation strategies. Will continue to follow acutely to address established goals.  SpO2 86-92% on RA sitting EOB SpO2 89-91% on 3L O2 [place] with ambulation    Recommendations for follow up therapy are one component of a multi-disciplinary discharge planning process, led by the attending physician.  Recommendations may be updated based on patient status, additional functional criteria and insurance authorization.  Follow Up Recommendations  No PT follow up     Assistance Recommended at Discharge PRN  Patient can return home with the following Help with stairs or ramp for entrance;Assistance with cooking/housework;Assist for transportation   Equipment Recommendations  None recommended by PT    Recommendations for Other Services       Precautions / Restrictions Precautions Precautions: Fall;Other (comment) Precaution Comments: Watch SpO2 (does not wear baseline) Restrictions Weight Bearing Restrictions: No     Mobility  Bed Mobility Overal bed mobility: Modified Independent             General bed  mobility comments: HOB elevated    Transfers Overall transfer level: Independent Equipment used: None Transfers: Sit to/from Stand                  Ambulation/Gait Ambulation/Gait assistance: Supervision Gait Distance (Feet): 175 Feet (+ 50') Assistive device: None Gait Pattern/deviations: Step-through pattern, Decreased stride length, Trunk flexed Gait velocity: Decreased     General Gait Details: slow, fatigued gait with supervision for safety/lines; pt walking 175' then requesting seated rest break, requiring ~5-min rest before walking additional 50' back to room; intermittent cues to remind pt of importance of activity pacing   Stairs             Wheelchair Mobility    Modified Rankin (Stroke Patients Only)       Balance Overall balance assessment: Modified Independent   Sitting balance-Leahy Scale: Good     Standing balance support: No upper extremity supported, During functional activity Standing balance-Leahy Scale: Good               High level balance activites: Side stepping, Direction changes, Turns, Sudden stops High Level Balance Comments: no overt instability or LOB observed with higher level balance tasks; pt generally guarded with activity, reports related to fatigue            Cognition Arousal/Alertness: Awake/alert Behavior During Therapy: WFL for tasks assessed/performed, Flat affect Overall Cognitive Status: Within Functional Limits for tasks assessed                                          Exercises      General Comments  General comments (skin integrity, edema, etc.): SpO2 down to 89% on 3L O2 [place]      Pertinent Vitals/Pain Pain Assessment Pain Assessment: Faces Faces Pain Scale: Hurts a little bit Pain Location: abdomen from medication shot Pain Descriptors / Indicators: Grimacing Pain Intervention(s): Monitored during session    Home Living                          Prior  Function            PT Goals (current goals can now be found in the care plan section) Progress towards PT goals: Progressing toward goals    Frequency    Min 3X/week      PT Plan Current plan remains appropriate    Co-evaluation              AM-PAC PT "6 Clicks" Mobility   Outcome Measure  Help needed turning from your back to your side while in a flat bed without using bedrails?: None Help needed moving from lying on your back to sitting on the side of a flat bed without using bedrails?: None Help needed moving to and from a bed to a chair (including a wheelchair)?: None Help needed standing up from a chair using your arms (e.g., wheelchair or bedside chair)?: None Help needed to walk in hospital room?: A Little Help needed climbing 3-5 steps with a railing? : A Little 6 Click Score: 22    End of Session Equipment Utilized During Treatment: Gait belt;Oxygen Activity Tolerance: Patient tolerated treatment well Patient left: in chair;with call bell/phone within reach Nurse Communication: Mobility status PT Visit Diagnosis: Difficulty in walking, not elsewhere classified (R26.2)     Time: 8916-9450 PT Time Calculation (min) (ACUTE ONLY): 27 min  Charges:  $Therapeutic Exercise: 23-37 mins                     Mabeline Caras, PT, DPT Acute Rehabilitation Services  Personal: [facility] Office: [place] 03/20/2022, 12:26 PM

## 2022-03-21 DIAGNOSIS — I214 Non-ST elevation (NSTEMI) myocardial infarction: Secondary | ICD-10-CM | POA: Diagnosis not present

## 2022-03-21 DIAGNOSIS — I2510 Atherosclerotic heart disease of native coronary artery without angina pectoris: Secondary | ICD-10-CM | POA: Diagnosis not present

## 2022-03-21 DIAGNOSIS — I700 Atherosclerosis of aorta: Secondary | ICD-10-CM | POA: Diagnosis not present

## 2022-03-21 DIAGNOSIS — I2584 Coronary atherosclerosis due to calcified coronary lesion: Secondary | ICD-10-CM | POA: Diagnosis not present

## 2022-03-21 LAB — CBC
HCT: 37.1 % — ABNORMAL LOW (ref 39.0–52.0)
Hemoglobin: 11.3 g/dL — ABNORMAL LOW (ref 13.0–17.0)
MCH: 23.8 pg — ABNORMAL LOW (ref 26.0–34.0)
MCHC: 30.5 g/dL (ref 30.0–36.0)
MCV: 78.3 fL — ABNORMAL LOW (ref 80.0–100.0)
Platelets: 779 10*3/uL — ABNORMAL HIGH (ref 150–400)
RBC: 4.74 MIL/uL (ref 4.22–5.81)
RDW: 14.6 % (ref 11.5–15.5)
WBC: 14.3 10*3/uL — ABNORMAL HIGH (ref 4.0–10.5)
nRBC: 0 % (ref 0.0–0.2)

## 2022-03-21 LAB — COMPREHENSIVE METABOLIC PANEL
ALT: 22 U/L (ref 0–44)
AST: 50 U/L — ABNORMAL HIGH (ref 15–41)
Albumin: 1.7 g/dL — ABNORMAL LOW (ref 3.5–5.0)
Alkaline Phosphatase: 109 U/L (ref 38–126)
Anion gap: 11 (ref 5–15)
BUN: 15 mg/dL (ref 8–23)
CO2: 29 mmol/L (ref 22–32)
Calcium: 8.4 mg/dL — ABNORMAL LOW (ref 8.9–10.3)
Chloride: 96 mmol/L — ABNORMAL LOW (ref 98–111)
Creatinine, Ser: 0.84 mg/dL (ref 0.61–1.24)
GFR, Estimated: >60 mL/min (ref >60)
Glucose, Bld: 127 mg/dL — ABNORMAL HIGH (ref 70–99)
Potassium: 4.5 mmol/L (ref 3.5–5.1)
Sodium: 136 mmol/L (ref 135–145)
Total Bilirubin: 0.5 mg/dL (ref 0.3–1.2)
Total Protein: 7.1 g/dL (ref 6.5–8.1)

## 2022-03-21 MED ORDER — AMIODARONE HCL 200 MG PO TABS
200.0000 mg | ORAL_TABLET | Freq: Every day | ORAL | Status: DC
  Administered 2022-03-21 – 2022-03-28 (×8): 200 mg via ORAL
  Filled 2022-03-21 (×8): qty 1

## 2022-03-21 NOTE — Progress Notes (Signed)
   03/21/22 1057  Mobility  Activity Ambulated with assistance to bathroom  Level of Assistance Standby assist, set-up cues, supervision of patient - no hands on  Assistive Device None  Distance Ambulated (ft) 10 ft  Activity Response Tolerated well  Mobility Referral Yes  $Mobility charge 1 Mobility   Mobility Specialist Progress Note  Received pt in bed having no complaints and agreeable to mobility. Left in BR w/ all needs met. RN notified.   Steven Johnston Mobility Specialist

## 2022-03-21 NOTE — Progress Notes (Signed)
Progress Note   Patient: Steven Johnston [id number]:[id number] DOB: February 05, 1961 DOA: 03/15/2022     5 DOS: the patient was seen and examined on 03/21/2022   Brief hospital course: 61 year old M with PMH of HIV, hep B, NSCLC with widespread mets s/p palliative radiation and chemo now on immunotherapy, prior tobacco use and cocaine use presenting with chest pain and DOE, and admitted for non-STEMI and acute respiratory failure with hypoxia.  Desaturated to 80s requiring 6 L to recover to 90s.  Troponin trended from 1200-6400.  UDS positive for cocaine.  CTA chest showed progressive thoracic malignancy with increased masslike consolidation in left upper lobe, progressive lymphogenic spread of tumor throughout the left lung, a large malignant left pleural effusion, enlarged metastatic pulmonary nodules in right lung and osseous metastasis to the right scapula.  [facility] cardiology consulted for non-STEMI and recommended conservative management.  Oncology consulted for lung cancer.  Pulmonology consulted and did not feel there is utility to thoracocentesis since most of his left lung is tumor tissue, and recommended hospice.  Palliative medicine recommended DNR/DNI without escalation of care. Currently requiring 6 L by HFNC.    Assessment and Plan: Acute respiratory failure with hypoxia likely due to lung cancer: Chest x-ray and CT reviewed as above.  Unfortunately, this is going to be a progressive issue.  Low suspicion for infection with negative procalcitonin.  -Appreciate input by PCCM-recommended considering hospice.  -On Demadex 10 mg daily per cardiology -Incentive spirometry, OOB and wean off oxygen as able, weaned down to [facility] thus far. Possible will need home o2 closer to d/c, would eval at that time -Appreciate palliative care recommendation-DNR/DNI and no escalation of care   Initial Non-STEMI/elevated troponin/chest pain, later STEMI: In the setting of cocaine use and hypoxemia.  Troponin peaked at  6234 and trended down.  BNP normal.  TTE with LVEF of 60 to 65%, G1 DD, moderate LAE, small to moderate pericardial effusion.  CTA chest as above.  Chest pain could partly be due to malignancy. -Cardiology following. See overnight events. Pt later ruled in for STEMI on evening of 10/17, seen by Cardiology. Recs for cont medical mgt.  -Pt denies further chest pains this AM. Pt did report feeling better today -Pt had been continued on cardizem and labetalol, however pt became more hypotensive and HR now suboptimally controlled -Have reached out to cardiology. Recs to try on amiodarone 283m instead   NSCLC with widespread metastasis s/p palliative radiation, chemo and currently on immunotherapy. -Appreciate input by oncology, recs for continued immunotherapy -Palliative medicine following.   HIV/AIDS -Continue home Biktarvy   Cocaine use: UDS positive but patient denies use. -Encouraged to refrain   Goal of care counseling: Palliative met with patient.  CODE STATUS changed to DNR/DNI.   Physical deconditioning/generalized weakness -PT/OT seen and evaluated. No PT/OT follow up was recommended   Leukocytosis/thrombocytosis: Reactive.   Severe malnutrition: As evidenced by low BMI and significant muscle mass and subcu fat loss. Body mass index is 16.07 kg/m.   Pressure skin injury: POA     Pressure Injury 03/15/22 Sacrum Right;Left Stage 2 -  Partial thickness loss of dermis presenting as a shallow open injury with a red, pink wound bed without slough. (Active)  03/15/22 1628  Location: Sacrum  Location Orientation: Right;Left  Staging: Stage 2 -  Partial thickness loss of dermis presenting as a shallow open injury with a red, pink wound bed without slough.  Wound Description (Comments):   Present on Admission: Yes  Dressing  Type Foam - Lift dressing to assess site every shift 03/18/22 0755       Subjective: Reports breathing somewhat better today. Strongly declines any kind of  rehab stay  Physical Exam: Vitals:   03/21/22 0615 03/21/22 0800 03/21/22 0849 03/21/22 1116  BP: 91/62 106/72 97/65 111/84  Pulse:      Resp: (!) 24 20    Temp:      TempSrc: Oral     SpO2: 92% 93%    Weight:      Height:       General exam: Awake, laying in bed, in nad Respiratory system: Normal respiratory effort, no wheezing Cardiovascular system: regular rate, s1, s2 Gastrointestinal system: Soft, nondistended, positive BS Central nervous system: CN2-12 grossly intact, strength intact Extremities: Perfused, no clubbing Skin: Normal skin turgor, no notable skin lesions seen Psychiatry: Mood normal // no visual hallucinations     Data Reviewed:  Labs reviewed: Na 136, K 4.5, Cr 0.84, WBC 14.3, Hgb 11.3   Family Communication: Pt in room, family not at bedside  Disposition: Status is: Inpatient Remains inpatient appropriate because: Severity of illness  Planned Discharge Destination: Home    Author: Marylu Lund, MD 03/21/2022 12:09 PM  For on call review [email or web address].

## 2022-03-22 DIAGNOSIS — I2510 Atherosclerotic heart disease of native coronary artery without angina pectoris: Secondary | ICD-10-CM | POA: Diagnosis not present

## 2022-03-22 DIAGNOSIS — I214 Non-ST elevation (NSTEMI) myocardial infarction: Secondary | ICD-10-CM | POA: Diagnosis not present

## 2022-03-22 DIAGNOSIS — I2584 Coronary atherosclerosis due to calcified coronary lesion: Secondary | ICD-10-CM | POA: Diagnosis not present

## 2022-03-22 DIAGNOSIS — E43 Unspecified severe protein-calorie malnutrition: Secondary | ICD-10-CM | POA: Diagnosis not present

## 2022-03-22 DIAGNOSIS — I700 Atherosclerosis of aorta: Secondary | ICD-10-CM | POA: Diagnosis not present

## 2022-03-22 DIAGNOSIS — J9601 Acute respiratory failure with hypoxia: Secondary | ICD-10-CM | POA: Diagnosis not present

## 2022-03-22 DIAGNOSIS — C3492 Malignant neoplasm of unspecified part of left bronchus or lung: Secondary | ICD-10-CM | POA: Diagnosis not present

## 2022-03-22 LAB — CBC
HCT: 32.8 % — ABNORMAL LOW (ref 39.0–52.0)
Hemoglobin: 9.9 g/dL — ABNORMAL LOW (ref 13.0–17.0)
MCH: 23.7 pg — ABNORMAL LOW (ref 26.0–34.0)
MCHC: 30.2 g/dL (ref 30.0–36.0)
MCV: 78.5 fL — ABNORMAL LOW (ref 80.0–100.0)
Platelets: 660 10*3/uL — ABNORMAL HIGH (ref 150–400)
RBC: 4.18 MIL/uL — ABNORMAL LOW (ref 4.22–5.81)
RDW: 14.6 % (ref 11.5–15.5)
WBC: 12.7 10*3/uL — ABNORMAL HIGH (ref 4.0–10.5)
nRBC: 0 % (ref 0.0–0.2)

## 2022-03-22 LAB — COMPREHENSIVE METABOLIC PANEL
ALT: 18 U/L (ref 0–44)
AST: 31 U/L (ref 15–41)
Albumin: 1.5 g/dL — ABNORMAL LOW (ref 3.5–5.0)
Alkaline Phosphatase: 96 U/L (ref 38–126)
Anion gap: 13 (ref 5–15)
BUN: 13 mg/dL (ref 8–23)
CO2: 27 mmol/L (ref 22–32)
Calcium: 8.3 mg/dL — ABNORMAL LOW (ref 8.9–10.3)
Chloride: 96 mmol/L — ABNORMAL LOW (ref 98–111)
Creatinine, Ser: 0.82 mg/dL (ref 0.61–1.24)
GFR, Estimated: >60 mL/min (ref >60)
Glucose, Bld: 96 mg/dL (ref 70–99)
Potassium: 4.5 mmol/L (ref 3.5–5.1)
Sodium: 136 mmol/L (ref 135–145)
Total Bilirubin: 0.4 mg/dL (ref 0.3–1.2)
Total Protein: 6 g/dL — ABNORMAL LOW (ref 6.5–8.1)

## 2022-03-22 NOTE — Progress Notes (Signed)
SATURATION QUALIFICATIONS: (This note is used to comply with regulatory documentation for home oxygen)  Patient Saturations on Room Air at Rest = 82%  Patient Saturations on 4 Liters of oxygen while Ambulating = 88%  Please briefly explain why patient needs home oxygen: Patient's oxygen saturation drops to an unsafe level on room air.

## 2022-03-22 NOTE — Progress Notes (Signed)
Patient coughing and unable to catch breath. Patient HR has been 90-110s today, but upon receiving reports HR sustaining in 120s. Oxygen increased from 3 to 5 L HFNC. O2 saturation now 96%. HR 105-110. Will continue to monitor. PRN and scheduled medication to be administered.   03/22/22 1933  Assess: MEWS Score  Temp 98.8 F (37.1 C)  BP 102/66  MAP (mmHg) 76  Pulse Rate (!) 121  ECG Heart Rate (!) 121  Resp (!) 25  SpO2 (!) 89 %  Assess: MEWS Score  MEWS Temp 0  MEWS Systolic 0  MEWS Pulse 2  MEWS RR 1  MEWS LOC 0  MEWS Score 3  MEWS Score Color Yellow  Assess: if the MEWS score is Yellow or Red  Were vital signs taken at a resting state? Yes  Focused Assessment No change from prior assessment  Does the patient meet 2 or more of the SIRS criteria? Yes  Does the patient have a confirmed or suspected source of infection? No  MEWS guidelines implemented *See Row Information* No, previously yellow, continue vital signs every 4 hours  Treat  MEWS Interventions Administered prn meds/treatments;Administered scheduled meds/treatments  Pain Scale 0-10  Pain Score 0  Take Vital Signs  Increase Vital Sign Frequency   (Previously yellow mews, continue Q4 VS)  Escalate  MEWS: Escalate  (No, previously yellow, continue Q4 VS)  Document  Patient Outcome Other (Comment) (Stable, oxygen increased)  Progress note created (see row info) Yes  Assess: SIRS CRITERIA  SIRS Temperature  0  SIRS Pulse 1  SIRS Respirations  1  SIRS WBC 1  SIRS Score Sum  3

## 2022-03-22 NOTE — Progress Notes (Signed)
Daily Progress Note   Patient Name: Steven Johnston       Date: 03/22/2022 DOB: 18-Oct-1960  Age: 61 y.o. MRN#: [id number] Attending Physician: Donne Hazel, MD Primary Care Physician: Patient, No Pcp Per Admit Date: 03/15/2022  Reason for Consultation/Follow-up: Establishing goals of care  Subjective: Medical records reviewed including progress notes, labs. Patient assessed at the bedside. He states that his breathing is better, appears uncomfortable.  No family present during my visit.  We discussed interval history since our last conversation including STEMI, oxygen requirements symptom management needs.  Emotional support and therapeutic listening was provided.  A MOST form was reviewed and extensive conversation was had, covering concepts specific to code status, artifical feeding and hydration, continued IV antibiotics and rehospitalization.   A detailed discussion regarding advanced directives was had.  Education was provided and assisted patient with completion of the form to his satisfaction.  He wishes to name his son Steven Johnston as primary HCPOA and secondary HCPOA as Steven Johnston.  Informed patient that we can finalize documentation on Monday at the earliest given need for notary/witnesses and he verbalizes understanding.   Cardiopulmonary Resuscitation: Do Not Attempt Resuscitation (DNR)  Medical Interventions: Limited additional interventions: Use medical treatment, IV fluids and cardiac monitoring as indicated.  Do not use intubation or mechanical ventilation.  May consider use of less invasive airway support such as BiPAP or CPAP.  Also provide comfort measures.  Transfer to hospital if indicated.  Avoid intensive care.  Antibiotics: Determine use of limitation of antibiotics when  infection occurs  IV Fluids: IV fluids for a defined trial period  Feeding Tube: No feeding tube    Questions and concerns addressed. PMT will continue to support holistically.   Length of Stay: 6   Physical Exam Vitals and nursing note reviewed.  Constitutional:      Interventions: Nasal cannula in place.     Comments: 3L  Cardiovascular:     Rate and Rhythm: Normal rate.  Pulmonary:     Effort: Pulmonary effort is normal. No respiratory distress.  Neurological:     Mental Status: He is alert and oriented to person, place, and time.  Psychiatric:        Mood and Affect: Mood normal.        Behavior: Behavior normal.  Vital Signs: BP 106/70   Pulse (!) 102   Temp 98.8 F (37.1 C) (Oral)   Resp 20   Ht 5\' 10"  (1.778 m)   Wt 54.1 kg   SpO2 92%   BMI 17.11 kg/m  SpO2: SpO2: 92 % O2 Device: O2 Device: Nasal Cannula O2 Flow Rate: O2 Flow Rate (L/min): 3 L/min      Palliative Assessment/Data: 50%   Palliative Care Assessment & Plan   Patient Profile: 61 y.o. 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    Patient had just recently been hospitalized 9/6-9/8 with complaints of worsening cough and right shoulder pain.  Found to  metastatic disease with biopsy of the left supraclavicular noduleconglomeration bx showing non small cell carcinoma.   Patient now admitted with NSTEMI and CT angiogram of the chest obtained noting progressive malignancy, enlarging malignant left pleural effusion.  PMT has been consulted to assist with goals of care conversation.    Assessment: Goals of care conversation NSTEMI Stage IV metastatic lung cancer HIV  Recommendations/Plan: Continue DNR Continue current care without further escalation MOST form completed today  and patient provided with a copy. Original form placed on hard chart and will also scan copy into [facility]yn[employer]/EMR Spiritual care consult for completion of advance directives.  AD packet has been filled out and is on his bedside table awaiting notarization  Psychosocial and emotional support provided PMT will continue to follow and support holistically   Prognosis: < 6 months would not be surprising, hospice appropriate when aligned with patient goals of care   Discharge Planning: To Be Determined   Care plan was discussed with patient   MDM high         Lakishia Bourassa Johnnette Litter, PA-C  Palliative Medicine Team Team phone # [phone]  Thank you for allowing the Palliative Medicine Team to assist in the care of this patient. Please utilize secure chat with additional questions, if there is no response within 30 minutes please call the above phone number.  Palliative Medicine Team providers are available by phone from 7am to 7pm daily and can be reached through the team cell phone.  Should this patient require assistance outside of these hours, please call the patient's attending physician.

## 2022-03-22 NOTE — Progress Notes (Signed)
Progress Note   Patient: Steven Johnston IRS:[id number] DOB: 12/17/1960 DOA: 03/15/2022     6 DOS: the patient was seen and examined on 03/22/2022   Brief hospital course: 61 year old M with PMH of HIV, hep B, NSCLC with widespread mets s/p palliative radiation and chemo now on immunotherapy, prior tobacco use and cocaine use presenting with chest pain and DOE, and admitted for non-STEMI and acute respiratory failure with hypoxia.  Desaturated to 80s requiring 6 L to recover to 90s.  Troponin trended from 1200-6400.  UDS positive for cocaine.  CTA chest showed progressive thoracic malignancy with increased masslike consolidation in left upper lobe, progressive lymphogenic spread of tumor throughout the left lung, a large malignant left pleural effusion, enlarged metastatic pulmonary nodules in right lung and osseous metastasis to the right scapula.  [facility] cardiology consulted for non-STEMI and recommended conservative management.  Oncology consulted for lung cancer.  Pulmonology consulted and did not feel there is utility to thoracocentesis since most of his left lung is tumor tissue, and recommended hospice.  Palliative medicine recommended DNR/DNI without escalation of care. Currently requiring 6 L by HFNC.    Assessment and Plan: Acute respiratory failure with hypoxia likely due to lung cancer: Chest x-ray and CT reviewed as above.  Unfortunately, this is going to be a progressive issue.  Low suspicion for infection with negative procalcitonin.  -Appreciate input by PCCM-recommended considering hospice.  -On Demadex 10 mg daily per cardiology -Incentive spirometry, OOB and wean off oxygen as able, still requiring O2. Needing up to 4L on ambulation. Will need home o2 -Appreciate palliative care recommendation-DNR/DNI and no escalation of care   Initial Non-STEMI/elevated troponin/chest pain, later STEMI: In the setting of cocaine use and hypoxemia.  Troponin peaked at 6234 and trended down.  BNP  normal.  TTE with LVEF of 60 to 65%, G1 DD, moderate LAE, small to moderate pericardial effusion.  CTA chest as above.  Chest pain could partly be due to malignancy. -Cardiology following. See overnight events. Pt later ruled in for STEMI on evening of 10/17, seen by Cardiology. Recs for cont medical mgt.  -Pt denies further chest pains this AM. Pt did report feeling better today -Pt had been continued on cardizem and labetalol, however pt became more hypotensive and HR now suboptimally controlled -Have reached out to cardiology. Recs to try on amiodarone 234m instead   NSCLC with widespread metastasis s/p palliative radiation, chemo and currently on immunotherapy. -Appreciate input by oncology, recs for continued immunotherapy -Palliative medicine following.   HIV/AIDS -Continue home Biktarvy   Cocaine use: UDS positive but patient denies use. -Encouraged to refrain   Goal of care counseling: Palliative met with patient.  CODE STATUS changed to DNR/DNI.   Physical deconditioning/generalized weakness -PT/OT seen and evaluated. No PT/OT follow up was recommended   Leukocytosis/thrombocytosis: Reactive.   Severe malnutrition: As evidenced by low BMI and significant muscle mass and subcu fat loss. Body mass index is 16.07 kg/m.   Pressure skin injury: POA     Pressure Injury 03/15/22 Sacrum Right;Left Stage 2 -  Partial thickness loss of dermis presenting as a shallow open injury with a red, pink wound bed without slough. (Active)  03/15/22 1628  Location: Sacrum  Location Orientation: Right;Left  Staging: Stage 2 -  Partial thickness loss of dermis presenting as a shallow open injury with a red, pink wound bed without slough.  Wound Description (Comments):   Present on Admission: Yes  Dressing Type Foam - Lift dressing to  assess site every shift 03/18/22 0755       Subjective: States feeling somewhat better today  Physical Exam: Vitals:   03/21/22 1600 03/21/22 2122  03/22/22 0340 03/22/22 0500  BP: 93/65 108/64 106/73 106/70  Pulse: (!) 101 (!) 110 (!) 114 (!) 102  Resp: (!) 25 20 (!) 22 20  Temp:  98.8 F (37.1 C) 98.8 F (37.1 C)   TempSrc:  Oral Oral   SpO2: 94% 93% 92%   Weight:   54.1 kg   Height:       General exam: Conversant, in no acute distress Respiratory system: normal chest rise, clear, no audible wheezing Cardiovascular system: regular rhythm, s1-s2 Gastrointestinal system: Nondistended, nontender, pos BS Central nervous system: No seizures, no tremors Extremities: No cyanosis, no joint deformities Skin: No rashes, no pallor Psychiatry: Affect normal // no auditory hallucinations      Data Reviewed:  Labs reviewed: Na 136, K 4.5, Cr 0.82, WBC 12.7, Hgb 9.9   Family Communication: Pt in room, family not at bedside  Disposition: Status is: Inpatient Remains inpatient appropriate because: Severity of illness  Planned Discharge Destination: Home    Author: Marylu Lund, MD 03/22/2022 5:34 PM  For on call review [email or web address].

## 2022-03-23 ENCOUNTER — Inpatient Hospital Stay (HOSPITAL_COMMUNITY): Payer: Commercial Managed Care - HMO

## 2022-03-23 DIAGNOSIS — I2510 Atherosclerotic heart disease of native coronary artery without angina pectoris: Secondary | ICD-10-CM | POA: Diagnosis not present

## 2022-03-23 DIAGNOSIS — I214 Non-ST elevation (NSTEMI) myocardial infarction: Secondary | ICD-10-CM | POA: Diagnosis not present

## 2022-03-23 DIAGNOSIS — I2584 Coronary atherosclerosis due to calcified coronary lesion: Secondary | ICD-10-CM | POA: Diagnosis not present

## 2022-03-23 DIAGNOSIS — I700 Atherosclerosis of aorta: Secondary | ICD-10-CM | POA: Diagnosis not present

## 2022-03-23 LAB — COMPREHENSIVE METABOLIC PANEL
ALT: 16 U/L (ref 0–44)
AST: 29 U/L (ref 15–41)
Albumin: 1.5 g/dL — ABNORMAL LOW (ref 3.5–5.0)
Alkaline Phosphatase: 97 U/L (ref 38–126)
Anion gap: 9 (ref 5–15)
BUN: 13 mg/dL (ref 8–23)
CO2: 29 mmol/L (ref 22–32)
Calcium: 8.4 mg/dL — ABNORMAL LOW (ref 8.9–10.3)
Chloride: 97 mmol/L — ABNORMAL LOW (ref 98–111)
Creatinine, Ser: 0.94 mg/dL (ref 0.61–1.24)
GFR, Estimated: >60 mL/min (ref >60)
Glucose, Bld: 120 mg/dL — ABNORMAL HIGH (ref 70–99)
Potassium: 4.1 mmol/L (ref 3.5–5.1)
Sodium: 135 mmol/L (ref 135–145)
Total Bilirubin: 0.5 mg/dL (ref 0.3–1.2)
Total Protein: 6.5 g/dL (ref 6.5–8.1)

## 2022-03-23 LAB — MAGNESIUM: Magnesium: 1.9 mg/dL (ref 1.7–2.4)

## 2022-03-23 MED ORDER — PNEUMOCOCCAL 20-VAL CONJ VACC 0.5 ML IM SUSY
0.5000 mL | PREFILLED_SYRINGE | INTRAMUSCULAR | Status: AC
  Administered 2022-03-24: 0.5 mL via INTRAMUSCULAR
  Filled 2022-03-23: qty 0.5

## 2022-03-23 MED ORDER — MIDODRINE HCL 5 MG PO TABS
2.5000 mg | ORAL_TABLET | Freq: Three times a day (TID) | ORAL | Status: DC
  Administered 2022-03-23 – 2022-03-28 (×16): 2.5 mg via ORAL
  Filled 2022-03-23 (×16): qty 1

## 2022-03-23 MED ORDER — FUROSEMIDE 10 MG/ML IJ SOLN
40.0000 mg | Freq: Once | INTRAMUSCULAR | Status: AC
  Administered 2022-03-23: 40 mg via INTRAVENOUS
  Filled 2022-03-23: qty 4

## 2022-03-23 MED ORDER — INFLUENZA VAC SPLIT QUAD 0.5 ML IM SUSY
0.5000 mL | PREFILLED_SYRINGE | INTRAMUSCULAR | Status: AC
  Administered 2022-03-24: 0.5 mL via INTRAMUSCULAR
  Filled 2022-03-23: qty 0.5

## 2022-03-23 NOTE — Progress Notes (Signed)
Patient HR and RR increased after coughing fit. PRN medication given after bathed and bed changed. 5L HFNC on and saturation 94%. Denies CP and SOB. Will continue to monitor.   03/23/22 0400  Assess: MEWS Score  Temp 98.8 F (37.1 C)  BP 98/65  MAP (mmHg) 75  Pulse Rate (!) 107  ECG Heart Rate (!) 107  Resp (!) 26  SpO2 94 %  O2 Device HFNC  O2 Flow Rate (L/min) 5 L/min  Assess: MEWS Score  MEWS Temp 0  MEWS Systolic 1  MEWS Pulse 1  MEWS RR 2  MEWS LOC 0  MEWS Score 4  MEWS Score Color Red  Assess: if the MEWS score is Yellow or Red  Were vital signs taken at a resting state? Yes  Focused Assessment No change from prior assessment  Does the patient meet 2 or more of the SIRS criteria? Yes  MEWS guidelines implemented *See Row Information* Yes  Treat  MEWS Interventions Administered prn meds/treatments  Pain Scale 0-10  Pain Score 8  Pain Type Acute pain  Pain Location Rib cage  Pain Orientation Right;Left  Pain Radiating Towards back  Pain Descriptors / Indicators Sharp;Spasm  Pain Frequency Intermittent  Pain Onset Progressive  Patients Stated Pain Goal 0  Pain Intervention(s) RN made aware  Multiple Pain Sites No  Take Vital Signs  Increase Vital Sign Frequency  Red: Q 1hr X 4 then Q 4hr X 4, if remains red, continue Q 4hrs  Escalate  MEWS: Escalate Red: discuss with charge nurse/RN and provider, consider discussing with RRT  Notify: Charge Nurse/RN  Name of Charge Nurse/RN Notified Fred RN  Date Charge Nurse/RN Notified 03/23/22  Time Charge Nurse/RN Notified 0510  Notify: Rapid Response  Name of Rapid Response RN Notified Mindy RN  Date Rapid Response Notified 03/23/22  Time Rapid Response Notified 0510  Document  Patient Outcome Other (Comment) (stable no s/s of distress or detrioration)  Progress note created (see row info) Yes  Assess: SIRS CRITERIA  SIRS Temperature  0  SIRS Pulse 1  SIRS Respirations  1  SIRS WBC 1  SIRS Score Sum  3

## 2022-03-23 NOTE — Progress Notes (Signed)
Progress Note   Patient: Steven Johnston YNW:[id number] DOB: [birth year] DOA: 03/15/2022     7 DOS: the patient was seen and examined on 03/23/2022   Brief hospital course: 61 year old M with PMH of HIV, hep B, NSCLC with widespread mets s/p palliative radiation and chemo now on immunotherapy, prior tobacco use and cocaine use presenting with chest pain and DOE, and admitted for non-STEMI and acute respiratory failure with hypoxia.  Desaturated to 80s requiring 6 L to recover to 90s.  Troponin trended from 1200-6400.  UDS positive for cocaine.  CTA chest showed progressive thoracic malignancy with increased masslike consolidation in left upper lobe, progressive lymphogenic spread of tumor throughout the left lung, a large malignant left pleural effusion, enlarged metastatic pulmonary nodules in right lung and osseous metastasis to the right scapula.  [facility] cardiology consulted for non-STEMI and recommended conservative management.  Oncology consulted for lung cancer.  Pulmonology consulted and did not feel there is utility to thoracocentesis since most of his left lung is tumor tissue, and recommended hospice.  Palliative medicine recommended DNR/DNI without escalation of care. Currently requiring 6 L by HFNC.    Assessment and Plan: Acute respiratory failure with hypoxia likely due to lung cancer: Chest x-ray and CT reviewed as above.  Unfortunately, this is going to be a progressive issue.  Low suspicion for infection with negative procalcitonin.  -Appreciate input by PCCM-recommended considering hospice.  -On Demadex 10 mg daily per cardiology -Incentive spirometry, OOB and wean off oxygen as able, still requiring O2. Needing up to 5L on ambulation. Will need supplemental O2 -Appreciate palliative care recommendation-DNR/DNI and no escalation of care at this time. Pt's condition gradually declining and O2 requirements worsening, low bp hindering ability to tolerate diuretic or other cardiac  meds -Palliative Care has already mentioned likely <51monthprognosis, suspect hospice would be more appropriate in this situation. Would f/u with Palliative Care -Ordered and reviewed CXR,finding of likely pulm edema, give trial of lasix   Initial Non-STEMI/elevated troponin/chest pain, later STEMI: In the setting of cocaine use and hypoxemia.  Troponin peaked at 6234 and trended down.  BNP normal.  TTE with LVEF of 60 to 65%, G1 DD, moderate LAE, small to moderate pericardial effusion.  CTA chest as above.  Chest pain could partly be due to malignancy. -Cardiology following. See overnight events. Pt later ruled in for STEMI on evening of 10/17, seen by Cardiology. Recs for cont medical mgt.  -Pt denies further chest pains this AM. Pt did report feeling better today -Pt had been continued on cardizem and labetalol, however pt became more hypotensive and HR now suboptimally controlled -discussed with Cardiology who recommended transitioning to amiodarone 2051m  NSCLC with widespread metastasis s/p palliative radiation, chemo and currently on immunotherapy. -Appreciate input by oncology, recs for continued immunotherapy -Palliative medicine following.   HIV/AIDS -Continue home Biktarvy   Cocaine use: UDS positive but patient denies use. -Encouraged to refrain   Goal of care counseling: Palliative met with patient.  CODE STATUS changed to DNR/DNI.   Physical deconditioning/generalized weakness -PT/OT seen and evaluated. No PT/OT follow up was recommended   Leukocytosis/thrombocytosis: Reactive.   Severe malnutrition: As evidenced by low BMI and significant muscle mass and subcu fat loss. Body mass index is 16.07 kg/m.   Pressure skin injury: POA     Pressure Injury 03/15/22 Sacrum Right;Left Stage 2 -  Partial thickness loss of dermis presenting as a shallow open injury with a red, pink wound bed without slough. (Active)  03/15/22 1628  Location: Sacrum  Location Orientation:  Right;Left  Staging: Stage 2 -  Partial thickness loss of dermis presenting as a shallow open injury with a red, pink wound bed without slough.  Wound Description (Comments):   Present on Admission: Yes  Dressing Type Foam - Lift dressing to assess site every shift 03/18/22 0755       Subjective: Without complaints this AM  Physical Exam: Vitals:   03/23/22 0800 03/23/22 1000 03/23/22 1200 03/23/22 1532  BP: 94/66 97/65 91/60  90/66  Pulse:    (!) 115  Resp: (!) 24 (!) 22 (!) 27 (!) 22  Temp:    98.4 F (36.9 C)  TempSrc:    Oral  SpO2:    93%  Weight:      Height:       General exam: Awake, laying in bed, in nad Respiratory system: Normal respiratory effort, no wheezing Cardiovascular system: regular rate, s1, s2 Gastrointestinal system: Soft, nondistended, positive BS Central nervous system: CN2-12 grossly intact, strength intact Extremities: Perfused, no clubbing Skin: Normal skin turgor, no notable skin lesions seen Psychiatry: Mood normal // no visual hallucinations   Data Reviewed:  Labs reviewed: Na 135, K 4.1, Cr 0.94   Family Communication: Pt in room, family not at bedside  Disposition: Status is: Inpatient Remains inpatient appropriate because: Severity of illness  Planned Discharge Destination: Home    Author: Marylu Lund, MD 03/23/2022 3:56 PM  For on call review [email or web address].

## 2022-03-24 DIAGNOSIS — I2510 Atherosclerotic heart disease of native coronary artery without angina pectoris: Secondary | ICD-10-CM | POA: Diagnosis not present

## 2022-03-24 DIAGNOSIS — I700 Atherosclerosis of aorta: Secondary | ICD-10-CM | POA: Diagnosis not present

## 2022-03-24 DIAGNOSIS — I214 Non-ST elevation (NSTEMI) myocardial infarction: Secondary | ICD-10-CM | POA: Diagnosis not present

## 2022-03-24 DIAGNOSIS — I2584 Coronary atherosclerosis due to calcified coronary lesion: Secondary | ICD-10-CM | POA: Diagnosis not present

## 2022-03-24 MED ORDER — FUROSEMIDE 10 MG/ML IJ SOLN
40.0000 mg | Freq: Once | INTRAMUSCULAR | Status: AC
  Administered 2022-03-24: 40 mg via INTRAVENOUS
  Filled 2022-03-24: qty 4

## 2022-03-24 MED ORDER — TORSEMIDE 20 MG PO TABS
10.0000 mg | ORAL_TABLET | ORAL | Status: DC
  Administered 2022-03-25 – 2022-03-27 (×2): 10 mg via ORAL
  Filled 2022-03-24 (×3): qty 1

## 2022-03-24 MED ORDER — ALBUMIN HUMAN 25 % IV SOLN
25.0000 g | Freq: Once | INTRAVENOUS | Status: AC
  Administered 2022-03-24: 25 g via INTRAVENOUS
  Filled 2022-03-24: qty 100

## 2022-03-24 NOTE — Progress Notes (Signed)
Physical Therapy Treatment Patient Details Name: Steven Johnston MRN: [id number] DOB: [birth year] Today's Date: 03/24/2022   History of Present Illness Pt is a 61 y/o male admitted 03/15/21 with chest pain, admitted for NSTEMI and acute respiratory failure with hypoxia. Code STEMI called 10/17; per cardiology, do not currently recommend cardiac cath at this time due to medical comorbidities. 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    PT Comments    Pt with elevated HR over weekend, better rate control today. Agreeable to ambulation on 2L O2. Pt continues to only need assistance for management of lines and leads. Pt ambulates 100 feet, HR in high 120s and pt requesting seated rest break. Requires ~3 min to recover to resting HR. After which, pt ambulates back to room with similar increase in HR and minor chest pain. Pt requests more shorter bouts of ambulation. Mobility specialist on floor notified. D/c plan remains appropriate. PT will continue to follow acutely.    Recommendations for follow up therapy are one component of a multi-disciplinary discharge planning process, led by the attending physician.  Recommendations may be updated based on patient status, additional functional criteria and insurance authorization.  Follow Up Recommendations  No PT follow up     Assistance Recommended at Discharge PRN  Patient can return home with the following Help with stairs or ramp for entrance;Assistance with cooking/housework;Assist for transportation   Equipment Recommendations  None recommended by PT       Precautions / Restrictions Precautions Precautions: Fall;Other (comment) Precaution Comments: Watch SpO2 (does not wear baseline) Restrictions Weight Bearing Restrictions: No     Mobility  Bed Mobility Overal bed mobility: Modified  Independent             General bed mobility comments: HOB elevated    Transfers Overall transfer level: Independent Equipment used: None Transfers: Sit to/from Stand                  Ambulation/Gait Ambulation/Gait assistance: Scientist, forensic (Feet): 100 Feet Assistive device: None Gait Pattern/deviations: Step-through pattern, Decreased stride length, Trunk flexed Gait velocity: Decreased Gait velocity interpretation: 1.31 - 2.62 ft/sec, indicative of limited community ambulator   General Gait Details: slowed, mildly unsteady gait, ambulated about 100 feet, HR in high 120s and pt requesting seated restbreak, 3 min recovery and HR back at 105 bpm, ambulated back to room with similare elevation in HR and c/o chest tightness         Balance Overall balance assessment: Modified Independent   Sitting balance-Leahy Scale: Good     Standing balance support: No upper extremity supported, During functional activity Standing balance-Leahy Scale: Good                              Cognition Arousal/Alertness: Awake/alert Behavior During Therapy: WFL for tasks assessed/performed, Flat affect Overall Cognitive Status: Within Functional Limits for tasks assessed                                             General Comments General comments (skin integrity, edema, etc.): ambulated on 2L O2 SpO2 low 90s, HR 107bpm at rest max noted with elevation 130 bpm, with c/o of chest tightness, pt  request more short bouts of ambulation      Pertinent Vitals/Pain Pain Assessment Pain Assessment: Faces Faces Pain Scale: Hurts a little bit Pain Location: chest with ambulation Pain Descriptors / Indicators: Grimacing Pain Intervention(s): Limited activity within patient's tolerance, Monitored during session, Repositioned     PT Goals (current goals can now be found in the care plan section) Acute Rehab PT Goals PT Goal Formulation: With  patient Time For Goal Achievement: 04/01/22 Potential to Achieve Goals: Fair Progress towards PT goals: Progressing toward goals    Frequency    Min 3X/week      PT Plan Current plan remains appropriate       AM-PAC PT "6 Clicks" Mobility   Outcome Measure  Help needed turning from your back to your side while in a flat bed without using bedrails?: None Help needed moving from lying on your back to sitting on the side of a flat bed without using bedrails?: None Help needed moving to and from a bed to a chair (including a wheelchair)?: None Help needed standing up from a chair using your arms (e.g., wheelchair or bedside chair)?: None Help needed to walk in hospital room?: A Little Help needed climbing 3-5 steps with a railing? : A Little 6 Click Score: 22    End of Session Equipment Utilized During Treatment: Gait belt;Oxygen Activity Tolerance: Patient tolerated treatment well Patient left: in chair;with call bell/phone within reach Nurse Communication: Mobility status PT Visit Diagnosis: Difficulty in walking, not elsewhere classified (R26.2)     Time: 2376-2831 PT Time Calculation (min) (ACUTE ONLY): 21 min  Charges:  $Therapeutic Exercise: 8-22 mins                     Keawe Marcello B. Migdalia Dk PT, DPT Acute Rehabilitation Services Please use secure chat or  Call Office [phone]    [place] 03/24/2022, 11:50 AM

## 2022-03-24 NOTE — Progress Notes (Signed)
Nutrition Follow-up  DOCUMENTATION CODES:   Severe malnutrition in context of chronic illness, Underweight  INTERVENTION:  Continue regular diet as ordered Discontinue ProSource Plus Ensure Plus High Protein po TID, each supplement provides 350 kcal and 20 grams of protein. MVI with minerals daily  NUTRITION DIAGNOSIS:   Severe Malnutrition related to chronic illness (HIV, metastatic NSCLC) as evidenced by percent weight loss, severe fat depletion, severe muscle depletion.  ongoing  GOAL:   Patient will meet greater than or equal to 90% of their needs  Goal unmet  MONITOR:   PO intake, Supplement acceptance, Labs, Weight trends, Diet advancement  REASON FOR ASSESSMENT:   Malnutrition Screening Tool    ASSESSMENT:   Pt admitted with chest pain, found to have NSTEMI. 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  PMT following for GOC. Pt would not desire a feeding tube. Pt wishes to continue current care without further escalation.   Pt sitting in chair moaning in pain. Lunch tray on table, observed to be uneaten. He reports that he plans on eating in a little while. He states that he is drinking about 3-4 Ensure daily.   Average meal completions: 4.4% x 8 recorded meals (10/18-10/23)  Pork allergy listed in [facility]. Unable to confirm this allergy with patient. Unable to order Magic Cup at this time.   Reviewed documented weights. Pt's weight is -3.6 kg since 10/21.  Medications: biktarvy, MVI, torsemide every other day  Labs: reviewed  Diet Order:   Diet Order             Diet regular Room service appropriate? Yes; Fluid consistency: Thin  Diet effective now                   EDUCATION NEEDS:   Education needs have been addressed  Skin:  Skin Assessment: Skin Integrity Issues: Skin  Integrity Issues:: Stage II Stage II: sacrum  Last BM:  10/21  Height:   Ht Readings from Last 1 Encounters:  03/15/22 5\' 10"  (1.778 m)    Weight:   Wt Readings from Last 1 Encounters:  03/23/22 50.5 kg   BMI:  Body mass index is 15.97 kg/m.  Estimated Nutritional Needs:   Kcal:  1800-2000  Protein:  90-105g  Fluid:  >/=1.8L  Steven Johnston, RDN, LDN Clinical Nutrition

## 2022-03-24 NOTE — Progress Notes (Signed)
MEWS interventions started  Alvis Lemmings, RN 03/24/2022

## 2022-03-24 NOTE — Progress Notes (Signed)
Progress Note   Patient: Steven Johnston VFI:[id number] DOB: 1961-02-02 DOA: 03/15/2022     8 DOS: the patient was seen and examined on 03/24/2022   Brief hospital course: 61 year old M with PMH of HIV, hep B, NSCLC with widespread mets s/p palliative radiation and chemo now on immunotherapy, prior tobacco use and cocaine use presenting with chest pain and DOE, and admitted for non-STEMI and acute respiratory failure with hypoxia.  Desaturated to 80s requiring 6 L to recover to 90s.  Troponin trended from 1200-6400.  UDS positive for cocaine.  CTA chest showed progressive thoracic malignancy with increased masslike consolidation in left upper lobe, progressive lymphogenic spread of tumor throughout the left lung, a large malignant left pleural effusion, enlarged metastatic pulmonary nodules in right lung and osseous metastasis to the right scapula.  [facility] cardiology consulted for non-STEMI and recommended conservative management.  Oncology consulted for lung cancer.  Pulmonology consulted and did not feel there is utility to thoracocentesis since most of his left lung is tumor tissue, and recommended hospice.  Palliative medicine recommended DNR/DNI without escalation of care. Currently requiring 6 L by HFNC.    Assessment and Plan: Acute respiratory failure with hypoxia likely due to lung cancer: Chest x-ray and CT reviewed as above.  Unfortunately, this is going to be a progressive issue.  Low suspicion for infection with negative procalcitonin.  -Appreciate input by PCCM-recommended considering hospice.  -On Demadex 10 mg daily per cardiology -Incentive spirometry, OOB and wean off oxygen as able, still requiring O2. Needing up to 5L on ambulation. Will need supplemental O2 -Appreciate palliative care recommendation-DNR/DNI and no escalation of care at this time. Pt's condition gradually declining and O2 requirements worsening, low bp hindering ability to tolerate diuretic or other cardiac  meds -Palliative Care has already mentioned likely <48monthprognosis, suspect hospice would be more appropriate in this situation. Would f/u with Palliative Care -Ordered and reviewed CXR,finding of likely pulm edema, still on 5L - given another dose of lasix. Will give trial of albumin   Initial Non-STEMI/elevated troponin/chest pain, later STEMI: In the setting of cocaine use and hypoxemia.  Troponin peaked at 6234 and trended down.  BNP normal.  TTE with LVEF of 60 to 65%, G1 DD, moderate LAE, small to moderate pericardial effusion.  CTA chest as above.  Chest pain could partly be due to malignancy. -Cardiology following. See overnight events. Pt later ruled in for STEMI on evening of 10/17, seen by Cardiology. Recs for cont medical mgt.  -Pt denies further chest pains this AM. Pt did report feeling better today -Pt had been continued on cardizem and labetalol, however pt became more hypotensive and HR now suboptimally controlled -discussed with Cardiology who recommended transitioning to amiodarone 2042m HR more stable   NSCLC with widespread metastasis s/p palliative radiation, chemo and currently on immunotherapy. -Appreciate input by oncology, recs for continued immunotherapy -Palliative medicine following.   HIV/AIDS -Continue home Biktarvy   Cocaine use: UDS positive but patient denies use. -Encouraged to refrain   Goal of care counseling: Palliative met with patient.  CODE STATUS changed to DNR/DNI.   Physical deconditioning/generalized weakness -PT/OT seen and evaluated. No PT/OT follow up was recommended   Leukocytosis/thrombocytosis: Reactive.   Severe malnutrition: As evidenced by low BMI and significant muscle mass and subcu fat loss. Body mass index is 16.07 kg/m.  Hypotension -BP improved with midodrine   Pressure skin injury: POA     Pressure Injury 03/15/22 Sacrum Right;Left Stage 2 -  Partial  thickness loss of dermis presenting as a shallow open injury with a  red, pink wound bed without slough. (Active)  03/15/22 1628  Location: Sacrum  Location Orientation: Right;Left  Staging: Stage 2 -  Partial thickness loss of dermis presenting as a shallow open injury with a red, pink wound bed without slough.  Wound Description (Comments):   Present on Admission: Yes  Dressing Type Foam - Lift dressing to assess site every shift 03/18/22 0755       Subjective: Still feeling sob this AM  Physical Exam: Vitals:   03/24/22 0600 03/24/22 0900 03/24/22 1100 03/24/22 1602  BP: 108/64 90/62 (!) 92/59 95/65  Pulse: (!) 103 95 (!) 105 97  Resp: 19 (!) 26 (!) 22 (!) 30  Temp:  98 F (36.7 C) 97.8 F (36.6 C) 98.1 F (36.7 C)  TempSrc:  Oral Oral Oral  SpO2: 95% 97% 96% 93%  Weight:      Height:       General exam: Conversant, in no acute distress Respiratory system: normal chest rise, clear, no audible wheezing Cardiovascular system: regular rhythm, s1-s2 Gastrointestinal system: Nondistended, nontender, pos BS Central nervous system: No seizures, no tremors Extremities: No cyanosis, no joint deformities Skin: No rashes, no pallor Psychiatry: Affect normal // no auditory hallucinations   Data Reviewed:  There are no new results to review at this time.  Family Communication: Pt in room, family not at bedside  Disposition: Status is: Inpatient Remains inpatient appropriate because: Severity of illness  Planned Discharge Destination: Home    Author: Marylu Lund, MD 03/24/2022 6:23 PM  For on call review [email or web address].

## 2022-03-24 NOTE — Progress Notes (Signed)
OT Cancellation Note  Patient Details Name: Steven Johnston MRN: [id number] DOB: 1960-09-03   Cancelled Treatment:    Reason Eval/Treat Not Completed: Patient declined, no reason specified (pt stating he already got up today, asks that OT follow up tomorrow)  Lynnda Child, [place], OTR/L Acute Rehab [phone] - [phone]   Kaylyn Lim 03/24/2022, 3:27 PM

## 2022-03-24 NOTE — Progress Notes (Signed)
This chaplain responded to PMT PA-JC consult for notary services to complete the Pt. Advance Directive:  HCPOA.    The chaplain understands the Pt. filled out the document and the Pt. friend took the document home.  The Pt. prefers the chaplain F/U on Tuesday.  Chaplain Sallyanne Kuster [phone]

## 2022-03-25 DIAGNOSIS — I214 Non-ST elevation (NSTEMI) myocardial infarction: Secondary | ICD-10-CM | POA: Diagnosis not present

## 2022-03-25 DIAGNOSIS — F1490 Cocaine use, unspecified, uncomplicated: Secondary | ICD-10-CM | POA: Diagnosis not present

## 2022-03-25 DIAGNOSIS — J9601 Acute respiratory failure with hypoxia: Secondary | ICD-10-CM | POA: Diagnosis not present

## 2022-03-25 DIAGNOSIS — I2510 Atherosclerotic heart disease of native coronary artery without angina pectoris: Secondary | ICD-10-CM | POA: Diagnosis not present

## 2022-03-25 LAB — COMPREHENSIVE METABOLIC PANEL
ALT: 14 U/L (ref 0–44)
AST: 24 U/L (ref 15–41)
Albumin: 2 g/dL — ABNORMAL LOW (ref 3.5–5.0)
Alkaline Phosphatase: 94 U/L (ref 38–126)
Anion gap: 11 (ref 5–15)
BUN: 17 mg/dL (ref 8–23)
CO2: 30 mmol/L (ref 22–32)
Calcium: 8.4 mg/dL — ABNORMAL LOW (ref 8.9–10.3)
Chloride: 94 mmol/L — ABNORMAL LOW (ref 98–111)
Creatinine, Ser: 0.91 mg/dL (ref 0.61–1.24)
GFR, Estimated: >60 mL/min (ref >60)
Glucose, Bld: 99 mg/dL (ref 70–99)
Potassium: 3.8 mmol/L (ref 3.5–5.1)
Sodium: 135 mmol/L (ref 135–145)
Total Bilirubin: 0.5 mg/dL (ref 0.3–1.2)
Total Protein: 6.7 g/dL (ref 6.5–8.1)

## 2022-03-25 LAB — CBC
HCT: 33.5 % — ABNORMAL LOW (ref 39.0–52.0)
Hemoglobin: 10.2 g/dL — ABNORMAL LOW (ref 13.0–17.0)
MCH: 23.9 pg — ABNORMAL LOW (ref 26.0–34.0)
MCHC: 30.4 g/dL (ref 30.0–36.0)
MCV: 78.5 fL — ABNORMAL LOW (ref 80.0–100.0)
Platelets: 766 10*3/uL — ABNORMAL HIGH (ref 150–400)
RBC: 4.27 MIL/uL (ref 4.22–5.81)
RDW: 14.4 % (ref 11.5–15.5)
WBC: 13 10*3/uL — ABNORMAL HIGH (ref 4.0–10.5)
nRBC: 0 % (ref 0.0–0.2)

## 2022-03-25 NOTE — Progress Notes (Signed)
Occupational Therapy Treatment Patient Details Name: Steven Johnston MRN: [id number] DOB: 1961-02-02 Today's Date: 03/25/2022   History of present illness Pt is a 61 y/o male admitted 03/15/21 with chest pain, admitted for NSTEMI and acute respiratory failure with hypoxia. Code STEMI called 10/17; per cardiology, do not currently recommend cardiac cath at this time due to medical comorbidities. 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   OT comments  Pt progressing toward goals, able to complete in room ambulation on 6L O2 with SpO2 dropping to 88% at the lowest, pt declining ADLs this session. Pt mod I for bed mobility and transfers, able to complete seated therex seated EOB. BP soft (see below), RN aware. Pt presenting with impairments listed below, will follow acutely. Continue to recommend d/c home with family assist.  BP seated EOB: 83/63 (71) BP seated after ~2 mins: 90/63 (73) BP seated post-mobility 91/69 (77)   Recommendations for follow up therapy are one component of a multi-disciplinary discharge planning process, led by the attending physician.  Recommendations may be updated based on patient status, additional functional criteria and insurance authorization.    Follow Up Recommendations  No OT follow up    Assistance Recommended at Discharge Set up Supervision/Assistance  Patient can return home with the following  A little help with walking and/or transfers;A little help with bathing/dressing/bathroom;Assistance with cooking/housework;Assist for transportation;Help with stairs or ramp for entrance   Equipment Recommendations  BSC/3in1    Recommendations for Other Services PT consult    Precautions / Restrictions Precautions Precautions: Fall;Other (comment) Precaution Comments: Watch SpO2 (does not wear  baseline) Restrictions Weight Bearing Restrictions: No       Mobility Bed Mobility Overal bed mobility: Modified Independent             General bed mobility comments: to sit up at EOB    Transfers Overall transfer level: Modified independent Equipment used: None Transfers: Sit to/from Stand Sit to Stand: Min guard           General transfer comment: pushes O2 tank     Balance Overall balance assessment: Modified Independent   Sitting balance-Leahy Scale: Good     Standing balance support: No upper extremity supported, During functional activity Standing balance-Leahy Scale: Good                             ADL either performed or assessed with clinical judgement   ADL Overall ADL's : Needs assistance/impaired                         Toilet Transfer: Min guard;Cueing for safety;Cueing for sequencing Toilet Transfer Details (indicate cue type and reason): simulated via functional mobility         Functional mobility during ADLs: Min guard General ADL Comments: pt declining ADLs this session    Extremity/Trunk Assessment Upper Extremity Assessment Upper Extremity Assessment: RUE deficits/detail RUE Deficits / Details: Per pt chronic issues due hx ca limited strength/ROM RUE: Unable to fully assess due to pain   Lower Extremity Assessment Lower Extremity Assessment: Defer to PT evaluation        Vision   Vision Assessment?: No apparent visual deficits   Perception Perception Perception: Not tested   Praxis Praxis Praxis: Not tested    Cognition Arousal/Alertness: Awake/alert Behavior  During Therapy: WFL for tasks assessed/performed, Flat affect Overall Cognitive Status: Within Functional Limits for tasks assessed                                          Exercises Exercises: Other exercises, General Lower Extremity General Exercises - Lower Extremity Long Arc Quad: AROM, Both, 10 reps, Seated Heel  Raises: AROM, Both, 10 reps, Seated    Shoulder Instructions       General Comments SpO2 dropping to 88% on 6L O2, soft BP see note    Pertinent Vitals/ Pain       Pain Assessment Pain Assessment: No/denies pain  Home Living                                          Prior Functioning/Environment              Frequency  Min 2X/week        Progress Toward Goals  OT Goals(current goals can now be found in the care plan section)  Progress towards OT goals: Progressing toward goals  Acute Rehab OT Goals Patient Stated Goal: none stated OT Goal Formulation: With patient Time For Goal Achievement: 04/01/22 Potential to Achieve Goals: Good ADL Goals Pt Will Perform Upper Body Dressing: Independently;sitting Pt Will Perform Lower Body Dressing: Independently;sit to/from stand Pt Will Transfer to Toilet: Independently;ambulating;regular height toilet Pt Will Perform Tub/Shower Transfer: Tub transfer;Shower transfer;Independently;ambulating Additional ADL Goal #1: pt will verbalize 3 energy conservation strategies in prep for ADLs  Plan Discharge plan remains appropriate    Co-evaluation                 AM-PAC OT "6 Clicks" Daily Activity     Outcome Measure   Help from another person eating meals?: None Help from another person taking care of personal grooming?: A Little Help from another person toileting, which includes using toliet, bedpan, or urinal?: A Little Help from another person bathing (including washing, rinsing, drying)?: A Little Help from another person to put on and taking off regular upper body clothing?: A Little Help from another person to put on and taking off regular lower body clothing?: A Little 6 Click Score: 19    End of Session Equipment Utilized During Treatment: Gait belt;Oxygen  OT Visit Diagnosis: Unsteadiness on feet (R26.81);Other abnormalities of gait and mobility (R26.89);Muscle weakness (generalized)  (M62.81)   Activity Tolerance Patient tolerated treatment well   Patient Left in bed;with call bell/phone within reach;with bed alarm set   Nurse Communication Mobility status;Other (comment) (soft BP)        Time: 8341-9622 OT Time Calculation (min): 19 min  Charges: OT General Charges $OT Visit: 1 Visit OT Treatments $Therapeutic Activity: 8-22 mins  Lynnda Child, OTD, OTR/L Acute Rehab [phone]) [phone] - [place] 03/25/2022, 12:23 PM

## 2022-03-25 NOTE — Progress Notes (Signed)
Nurse requested Mobility Specialist to perform oxygen saturation test with pt which includes removing pt from oxygen both at rest and while ambulating.  Below are the results from that testing.     Patient Saturations on Room Air at Rest = spO2 N/A%  Patient Saturations on Room Air while Ambulating = sp02 N/A% .  Patient Saturations on 6 Liters of oxygen while Ambulating = sp02 94%  At end of testing pt left in room on 5  Liters of oxygen.  Reported results to nurse.

## 2022-03-25 NOTE — Progress Notes (Signed)
   03/25/22 1441  Mobility  Activity Ambulated with assistance in hallway  Level of Assistance Standby assist, set-up cues, supervision of patient - no hands on  Assistive Device Other (Comment) (O2 tank)  Distance Ambulated (ft) 280 ft  Activity Response Tolerated well  Mobility Referral Yes  $Mobility charge 1 Mobility   Mobility Specialist Progress Note  Received pt in bed  having no complaints and agreeable to mobility. Pt was asymptomatic throughout ambulation and returned to room w/o fault. Left EOB w/ call bell in reach and all needs met.   Steven Johnston Mobility Specialist

## 2022-03-25 NOTE — Plan of Care (Signed)
  Problem: Education: Goal: Knowledge of General Education information will improve Description: Including pain rating scale, medication(s)/side effects and non-pharmacologic comfort measures Outcome: Progressing   Problem: Health Behavior/Discharge Planning: Goal: Ability to manage health-related needs will improve Outcome: Progressing   Problem: Clinical Measurements: Goal: Ability to maintain clinical measurements within normal limits will improve Outcome: Progressing Goal: Will remain free from infection Outcome: Progressing Goal: Diagnostic test results will improve Outcome: Progressing Goal: Respiratory complications will improve Outcome: Progressing Goal: Cardiovascular complication will be avoided Outcome: Progressing   Problem: Activity: Goal: Risk for activity intolerance will decrease Outcome: Not Progressing   Problem: Coping: Goal: Level of anxiety will decrease Outcome: Not Progressing

## 2022-03-25 NOTE — TOC Progression Note (Signed)
Transition of Care [facility]) - Progression Note    Patient Details  Name: Steven Johnston MRN: [id number] Date of Birth: May 22, 1961  [facility]) CM/SW Contact  Zenon Mayo, RN Phone Number: 03/25/2022, 6:39 PM  Clinical Narrative:    Patient with wide spread mets, l lung is mostly tumor tissue, conts on 6 liters HFNC, per palliative consult cont care without escalation.  TOC following.         Expected Discharge Plan and Services                                                 Social Determinants of Health (SDOH) Interventions    Readmission Risk Interventions     No data to display

## 2022-03-25 NOTE — Progress Notes (Signed)
Progress Note   Patient: Steven Johnston FBP:[id number] DOB: 1960-10-02 DOA: 03/15/2022     9 DOS: the patient was seen and examined on 03/25/2022   Brief hospital course: 61 year old M with PMH of HIV, hep B, NSCLC with widespread mets s/p palliative radiation and chemo now on immunotherapy, prior tobacco use and cocaine use presenting with chest pain and DOE, and admitted for non-STEMI and acute respiratory failure with hypoxia.  Desaturated to 80s requiring 6 L to recover to 90s.  Troponin trended from 1200-6400.  UDS positive for cocaine.  CTA chest showed progressive thoracic malignancy with increased masslike consolidation in left upper lobe, progressive lymphogenic spread of tumor throughout the left lung, a large malignant left pleural effusion, enlarged metastatic pulmonary nodules in right lung and osseous metastasis to the right scapula.  [facility] cardiology consulted for non-STEMI and recommended conservative management.  Oncology consulted for lung cancer.  Pulmonology consulted and did not feel there is utility to thoracocentesis since most of his left lung is tumor tissue, and recommended hospice.  Palliative medicine recommended DNR/DNI without escalation of care. Currently requiring 6 L by HFNC.    Assessment and Plan: Acute respiratory failure with hypoxia likely due to lung cancer: Chest x-ray and CT reviewed as above.  Unfortunately, this is going to be a progressive issue.  Low suspicion for infection with negative procalcitonin.  -Appreciate input by PCCM-recommended considering hospice.  -On Demadex 10 mg daily per cardiology -Incentive spirometry, OOB and wean off oxygen as able, still requiring O2. Needing up to 6L on ambulation -Appreciate palliative care recommendation-DNR/DNI and no escalation of care at this time. Pt's condition gradually declining and O2 requirements worsening, low bp hindering ability to tolerate diuretic or other cardiac meds -Palliative Care has already  mentioned likely <70monthprognosis, suspect hospice would be more appropriate in this situation. Have reached out to Palliative Care to readdress goals of care, pending -Recent CXR,finding of likely pulm edema, however no significant improvement despite multiple doses of IV lasix with albumin. Concern of worsening hypoxia secondary to malignancy. Will recheck cxr in AM. Follow up with Palliative Care   Initial Non-STEMI/elevated troponin/chest pain, later STEMI: In the setting of cocaine use and hypoxemia.  Troponin peaked at 6234 and trended down.  BNP normal.  TTE with LVEF of 60 to 65%, G1 DD, moderate LAE, small to moderate pericardial effusion.  CTA chest as above.  Chest pain could partly be due to malignancy. -Cardiology following. See overnight events. Pt later ruled in for STEMI on evening of 10/17, seen by Cardiology. Recs for cont medical mgt.  -Pt denies further chest pains this AM. Pt did report feeling better today -Pt had been continued on cardizem and labetalol, however pt became more hypotensive and HR now suboptimally controlled -discussed with Cardiology who recommended transitioning to amiodarone 2074m HR now more stable   NSCLC with widespread metastasis s/p palliative radiation, chemo and currently on immunotherapy. -Appreciate input by oncology, recs for continued immunotherapy -Palliative medicine following.   HIV/AIDS -Continue home Biktarvy   Cocaine use: UDS positive but patient denies use. -Cessation done   Goal of care counseling: Palliative met with patient.  CODE STATUS changed to DNR/DNI.   Physical deconditioning/generalized weakness -PT/OT seen and evaluated. No PT/OT follow up was recommended   Leukocytosis/thrombocytosis: Reactive.   Severe malnutrition: As evidenced by low BMI and significant muscle mass and subcu fat loss. Body mass index is 16.07 kg/m.  Hypotension -BP improved with midodrine   Pressure skin  injury: POA     Pressure Injury  03/15/22 Sacrum Right;Left Stage 2 -  Partial thickness loss of dermis presenting as a shallow open injury with a red, pink wound bed without slough. (Active)  03/15/22 1628  Location: Sacrum  Location Orientation: Right;Left  Staging: Stage 2 -  Partial thickness loss of dermis presenting as a shallow open injury with a red, pink wound bed without slough.  Wound Description (Comments):   Present on Admission: Yes  Dressing Type Foam - Lift dressing to assess site every shift 03/18/22 0755       Subjective: Still with sob at rest. Tolerating PO  Physical Exam: Vitals:   03/24/22 1602 03/25/22 0000 03/25/22 0813 03/25/22 1100  BP: 95/65  100/66 91/68  Pulse: 97  (!) 109   Resp: (!) _0 Temp: 98.1 F (36.7 C)  98.1 F (36.7 C) (!) 97.5 F (36.4 C)  TempSrc: Oral Oral  Oral  SpO2: 93% 93% 94% 95%  Weight:      Height:       General exam: Awake, laying in bed, in nad Respiratory system: Normal respiratory effort, no wheezing Cardiovascular system: regular rate, s1, s2 Gastrointestinal system: Soft, nondistended, positive BS Central nervous system: CN2-12 grossly intact, strength intact Extremities: Perfused, no clubbing Skin: Normal skin turgor, no notable skin lesions seen Psychiatry: Mood normal // no visual hallucinations   Data Reviewed:  Labs reviewed: Na 135, Cr 0.91, WBC 13, Hgb 10.2  Family Communication: Pt in room, family not at bedside  Disposition: Status is: Inpatient Remains inpatient appropriate because: Severity of illness  Planned Discharge Destination: Home    Author: Marylu Lund, MD 03/25/2022 2:58 PM  For on call review [email or web address].

## 2022-03-26 ENCOUNTER — Inpatient Hospital Stay (HOSPITAL_COMMUNITY): Payer: Commercial Managed Care - HMO

## 2022-03-26 DIAGNOSIS — R638 Other symptoms and signs concerning food and fluid intake: Secondary | ICD-10-CM

## 2022-03-26 DIAGNOSIS — I2510 Atherosclerotic heart disease of native coronary artery without angina pectoris: Secondary | ICD-10-CM | POA: Diagnosis not present

## 2022-03-26 DIAGNOSIS — I700 Atherosclerosis of aorta: Secondary | ICD-10-CM | POA: Diagnosis not present

## 2022-03-26 DIAGNOSIS — Z789 Other specified health status: Secondary | ICD-10-CM

## 2022-03-26 DIAGNOSIS — J9601 Acute respiratory failure with hypoxia: Secondary | ICD-10-CM | POA: Diagnosis not present

## 2022-03-26 DIAGNOSIS — Z515 Encounter for palliative care: Secondary | ICD-10-CM

## 2022-03-26 DIAGNOSIS — Z711 Person with feared health complaint in whom no diagnosis is made: Secondary | ICD-10-CM

## 2022-03-26 DIAGNOSIS — Z66 Do not resuscitate: Secondary | ICD-10-CM

## 2022-03-26 DIAGNOSIS — I214 Non-ST elevation (NSTEMI) myocardial infarction: Secondary | ICD-10-CM | POA: Diagnosis not present

## 2022-03-26 NOTE — Progress Notes (Signed)
   03/26/22 1900  Assess: MEWS Score  BP 95/66  MAP (mmHg) 74  ECG Heart Rate (!) 108  Resp (!) 21  Assess: MEWS Score  MEWS Temp 0  MEWS Systolic 1  MEWS Pulse 1  MEWS RR 1  MEWS LOC 0  MEWS Score 3  MEWS Score Color Yellow  Assess: if the MEWS score is Yellow or Red  Were vital signs taken at a resting state? Yes  Focused Assessment No change from prior assessment  Does the patient meet 2 or more of the SIRS criteria? No  Does the patient have a confirmed or suspected source of infection? No  MEWS guidelines implemented *See Row Information* No, previously yellow, continue vital signs every 4 hours  Assess: SIRS CRITERIA  SIRS Temperature  0  SIRS Pulse 1  SIRS Respirations  1  SIRS WBC 1  SIRS Score Sum  3   Chronic yellow MEWS. Will continue to monitor.

## 2022-03-26 NOTE — Progress Notes (Signed)
This chaplain is present for creating/updating the Pt. Advance Directive:  HCPOA and Living Will. The chaplain understands the Pt. completed AD education with PMT. The Pt. answered the chaplain's clarifying questions on the role and purpose of an AD.  The chaplain is present with the Pt., notary, and two witnesses for the notarizing of the Pt. AD. The Pt. named Dereck Fullard as his healthcare agent.  If this person is unwilling/unable to serve in this role, the Pt. next choice is Guinevere Ferrari. The Pt. completed a Living Will.  The chaplain gave the Pt. the original AD along with two copies. The chaplain scanned the Pt. AD into his EMR.  This chaplain is available for F/U spiritual care as needed.  Chaplain Sallyanne Kuster [phone]

## 2022-03-26 NOTE — Progress Notes (Signed)
   03/26/22 1045  Mobility  Activity Ambulated with assistance in hallway  Level of Assistance Contact guard assist, steadying assist  Assistive Device None  Distance Ambulated (ft) 200 ft  Activity Response Tolerated well  Mobility Referral Yes  $Mobility charge 1 Mobility   Mobility Specialist Progress Note  Pre-Mobility: 94% SpO2 During Mobility: 88-98% SpO2 Post-Mobility: 96% SpO2  Pt in bed and agreeable. X1 seated break(5 minutes) d/t fatigue and SOB. Returned to chair w/ all needs met and call bell in reach.   Lucious Groves Mobility Specialist

## 2022-03-26 NOTE — TOC Progression Note (Signed)
Transition of Care [facility]) - Progression Note    Patient Details  Name: Steven Johnston MRN: [id number] Date of Birth: 10/25/1960  [facility]) CM/SW Contact  Zenon Mayo, RN Phone Number: 03/26/2022, 3:05 PM  Clinical Narrative:    NCM spoke with patient at the bedside regarding disability insurance.  This is what he had a question about, he states Jan[employer] Dils Attorney at [employer] is working on [employer]  disability for him.  He wanted to know where they were with it.  He states he does not have his cell phone here so he can not call to check on.  NCM called [phone] , was transferred to a Case Manager , Blanch [employer], left her a message for return call.   NCM received vm from Jan-Dils they state they can not talk to me about his case, patient will need to call them.         Expected Discharge Plan and Services                                                 Social Determinants of Health (SDOH) Interventions    Readmission Risk Interventions     No data to display

## 2022-03-26 NOTE — Progress Notes (Signed)
PT Cancellation Note  Patient Details Name: Steven Johnston MRN: [id number] DOB: 1960/07/07   Cancelled Treatment:    Reason Eval/Treat Not Completed: (P) Patient declined, no reason specified PT attempted therapy session at 1:30, pt was dozing off and requested PT follow back at 3:00. PT followed back and pt reports not feeling good and requests PT follow back tomorrow.  Steven Johnston B. Migdalia Dk PT, DPT Acute Rehabilitation Services Please use secure chat or  Call Office [phone]   [place] 03/26/2022, 3:18 PM

## 2022-03-26 NOTE — Progress Notes (Signed)
PROGRESS NOTE   Steven Johnston  VPX:[id number] DOB: 31-May-1961 DOA: 03/15/2022 PCP: Patient, No Pcp Per  Brief Narrative:  61 year old black male Underlying HIV on Biktarvy, hep B, prior suspect PJP pneumonia (09/2020) Recent diagnosis on 02/05/2022 admission --poorly differentiated NSCLC--+ brain metastases-subsequently receiving palliative XRT for scapular lesion last day XRT 03/12/2022--- patient started 10/12 on Libtayo every 3 weekly Continued tobacco, cocaine [UDS positive on admission 10/14]  Admit 10/14 found to have tachycardia tachypnea central chest pain and shortness of breath 10/14-given ASA 325 nitroglycerin EKG no ST-T wave elevation initial troponin 1212 WBC 1200 CXR worsening tumor burden versus superimposed process-CT chest without PE with worsening tumor burden and left pleural effusion Troponin increased to 246 Discussed with Dr. Terri Skains cardiology recommended EKG UDS heparin GTT Pulmonology consulted for trial of thoracentesis-on review of CT chest most of lung seem to be tumor tissue unlikely to respond to evacuation   Hospital-Problem based course  Metastatic NSCLC with hypoxia secondary to lung cancer Very poor prognosis--- agree with CCM input and with palliative input I do think he has probably less than a month I do not think he would be able to tolerate systemic chemo given his frailty and the metastatic process we will continue and he would not have any meaningful recovery he is on morphine for pain And can continue Oxy IR 5 mg every 6 as needed for moderate pain  NSTEMI + STEMI on 10/17--troponin peak 6234 Cardiology recommending medical management Initially on Cardizem/labetalol but now is on amiodarone 200 daily, Plavix 75, aspirin 81 Very poor prognosis overall Cardiology placed the patient on Demadex but review of x-rays revealed mainly lung tissue and not fluid-continue Demadex 10 q. other day Continue atorvastatin 40 nightly for what its  worth  Hypotension in the setting of recent NSTEMI/STEMI Continue midodrine 2.5 3 times daily and graduate as able Stop Nitropaste secondary to hypotension and can give Isordil if starts to have chest pain  HIV AIDS Continue Biktarvy  Severe malnutrition with BMI of 16  Leukocytosis Monitor trend  DVT prophylaxis: Lovenox Code Status: Full Family Communication: None Disposition:  Status is: Inpatient Remains inpatient appropriate because:   Poor prognosis but no clear plan for-lives alone at home so need to continue discussions with palliative care   Consultants:  Cardiology Palliative medicine   Procedures:    Antimicrobials: None currently   Subjective: Awake coherent no distress but seems weak-quite tearful  Objective: Vitals:   03/25/22 1609 03/25/22 2000 03/25/22 2300 03/26/22 0400  BP: 91/66 99/70 90/67  (!) 100/55  Pulse: 95 (!) 106 97 (!) 110  Resp: (!) 21 20 20 20   Temp: 97.7 F (36.5 C) 98.2 F (36.8 C)  99.1 F (37.3 C)  TempSrc: Oral Oral  Oral  SpO2: 95% 93% 96% 95%  Weight:      Height:        Intake/Output Summary (Last 24 hours) at 03/26/2022 0726 Last data filed at 03/26/2022 0400 Gross per 24 hour  Intake 698 ml  Output 500 ml  Net 198 ml   Filed Weights   03/21/22 0400 03/22/22 0340 03/23/22 0400  Weight: 54.1 kg 54.1 kg 50.5 kg    Examination:  EOMI frail cachectic black male no distress chest is clear no rales rhonchi-patient is very emaciated with supraclavicular and bitemporal wasting Abdomen is soft no rebound No lower extremity edema Neuro intact seems sad  Data Reviewed: personally reviewed   CBC    Component Value Date/Time   WBC 13.0 (  H) 03/25/2022 0114   RBC 4.27 03/25/2022 0114   HGB 10.2 (L) 03/25/2022 0114   HGB 11.7 (L) 03/13/2022 1158   HCT 33.5 (L) 03/25/2022 0114   PLT 766 (H) 03/25/2022 0114   PLT 656 (H) 03/13/2022 1158   MCV 78.5 (L) 03/25/2022 0114   MCH 23.9 (L) 03/25/2022 0114   MCHC 30.4  03/25/2022 0114   RDW 14.4 03/25/2022 0114   LYMPHSABS 1.3 03/13/2022 1158   MONOABS 1.1 (H) 03/13/2022 1158   EOSABS 0.5 03/13/2022 1158   BASOSABS 0.1 03/13/2022 1158      Latest Ref Rng & Units 03/25/2022    1:14 AM 03/23/2022   12:40 AM 03/22/2022    1:09 AM  CMP  Glucose 70 - 99 mg/dL 99  120  96   BUN 8 - 23 mg/dL 17  13  13    Creatinine 0.61 - 1.24 mg/dL 0.91  0.94  0.82   Sodium 135 - 145 mmol/L 135  135  136   Potassium 3.5 - 5.1 mmol/L 3.8  4.1  4.5   Chloride 98 - 111 mmol/L 94  97  96   CO2 22 - 32 mmol/L 30  29  27    Calcium 8.9 - 10.3 mg/dL 8.4  8.4  8.3   Total Protein 6.5 - 8.1 g/dL 6.7  6.5  6.0   Total Bilirubin 0.3 - 1.2 mg/dL 0.5  0.5  0.4   Alkaline Phos 38 - 126 U/L 94  97  96   AST 15 - 41 U/L 24  29  31    ALT 0 - 44 U/L 14  16  18       Radiology Studies: No results found.   Scheduled Meds:  (feeding supplement) PROSource Plus  30 mL Oral TID BM   amiodarone  200 mg Oral Daily   aspirin  81 mg Oral Daily   atorvastatin  40 mg Oral QHS   bictegravir-emtricitabine-tenofovir AF  1 tablet Oral Daily   clopidogrel  75 mg Oral Daily   enoxaparin (LOVENOX) injection  40 mg Subcutaneous Q24H   feeding supplement  237 mL Oral TID BM   midodrine  2.5 mg Oral TID WC   multivitamin with minerals  1 tablet Oral Daily   nitroGLYCERIN  1 inch Topical Q6H   sodium chloride flush  3 mL Intravenous Q12H   torsemide  10 mg Oral QODAY   Continuous Infusions:   LOS: 10 days   Time spent: [place], MD [facility] To contact the attending provider between 7A-7P or the covering provider during after hours 7P-7A, please log into the web site [email or web address] and access using universal [place] password for that web site. If you do not have the password, please call the hospital operator.  03/26/2022, 7:26 AM

## 2022-03-26 NOTE — Plan of Care (Signed)
  Problem: Education: Goal: Knowledge of General Education information will improve Description: Including pain rating scale, medication(s)/side effects and non-pharmacologic comfort measures Outcome: Progressing   Problem: Pain Managment: Goal: General experience of comfort will improve Outcome: Progressing   

## 2022-03-26 NOTE — Progress Notes (Signed)
Daily Progress Note   Patient Name: Steven Johnston       Date: 03/26/2022 DOB: 01-Apr-1961  Age: 61 y.o. MRN#: [id number] Attending Physician: Nita Sells, MD Primary Care Physician: Patient, No Pcp Per Admit Date: 03/15/2022  Reason for Consultation/Follow-up: Establishing goals of care  Subjective: Chart review performed. Received report from primary RN - no acute concerns. RN states patient is in good spirits today - has ambulated around the unit, up to chair, appetite remains poor.   Went to visit patient at bedside - he is sitting up in chair. No family/visitors present. No signs or non-verbal gestures of pain or discomfort noted. No respiratory distress, increased work of breathing, or secretions noted. He tells me it "felt good moving around" the unit today and is glad about getting AD documents completed. He denies pain or shortness of breath, reports "so-so" appetite. He is on 7L HFNC.  Emotional support provided. Reviewed interval history since last PMT visit. Continued GOC on continuing aggressive interventions vs transition to comfort/hospice care. Patient tells me "I am not wanting to look at end of life care. I have a 2.9% chance of living." When asked about goals, patient tells me he has a daughter that will graduate from grade 7 to 8 at the end of this school year, he is looking forward to this. Gently discussed that his cancer is worsening as evidenced by chest xray, increased oxygen requirement, and increased symptom burden with overall gradual decline - patient recognizes this. He understands that, even with aggressive treatment, he likely will not live to the end of the school year. Reviewed that no matter which path is chosen, aggressive vs comfort, his life expectancy is  short. Patient tells me he wants to "keep going until nothing else can be done." Reviewed attempted interventions in-house that have, unfortunately, been unsuccessful, such as diuretic, albumin, and cardiac medications. He understands the medical teams' concern that his body has not been responsive to interventions; also, reviewed procedures he is not a candidate for (such as thoracentesis). Patient understands there are limited, if any, medical interventions aimed at curative that can be offered that would provide improvement in his quality of life. Education provided that anorexia is a natural response to advanced disease - he understands he is not eating enough to sustain himself long term. Patient states "  that's life." He would like to discuss further with attending prior to making final decisions for comfort/hospice care. He is open for PMT follow up tomorrow for continued [place] [place] after speaking with attending.  All questions and concerns addressed. Encouraged to call with questions and/or concerns. PMT card provided.  Length of Stay: 10  Current Medications: Scheduled Meds:   (feeding supplement) PROSource Plus  30 mL Oral TID BM   amiodarone  200 mg Oral Daily   aspirin  81 mg Oral Daily   atorvastatin  40 mg Oral QHS   bictegravir-emtricitabine-tenofovir AF  1 tablet Oral Daily   clopidogrel  75 mg Oral Daily   enoxaparin (LOVENOX) injection  40 mg Subcutaneous Q24H   feeding supplement  237 mL Oral TID BM   midodrine  2.5 mg Oral TID WC   multivitamin with minerals  1 tablet Oral Daily   nitroGLYCERIN  1 inch Topical Q6H   sodium chloride flush  3 mL Intravenous Q12H   torsemide  10 mg Oral QODAY    Continuous Infusions:   PRN Meds: acetaminophen **OR** acetaminophen, albuterol, benzonatate, HYDROcodone bit-homatropine, morphine injection, ondansetron **OR** ondansetron (ZOFRAN) IV, oxyCODONE  Physical Exam Vitals and nursing note reviewed.  Constitutional:      General: He is  not in acute distress.    Appearance: He is cachectic. He is ill-appearing.  Pulmonary:     Effort: No respiratory distress.  Skin:    General: Skin is warm and dry.  Neurological:     Mental Status: He is alert and oriented to person, place, and time.  Psychiatric:        Attention and Perception: Attention normal.        Behavior: Behavior is cooperative.        Cognition and Memory: Cognition and memory normal.             Vital Signs: BP (!) 85/61 (BP Location: Right Arm)   Pulse 99   Temp 97.6 F (36.4 C) (Oral)   Resp (!) 30   Ht 5\' 10"  (1.778 m)   Wt 50.5 kg   SpO2 100%   BMI 15.97 kg/m  SpO2: SpO2: 100 % O2 Device: O2 Device: High Flow Nasal Cannula O2 Flow Rate: O2 Flow Rate (L/min): 5 L/min  Intake/output summary:  Intake/Output Summary (Last 24 hours) at 03/26/2022 1225 Last data filed at 03/26/2022 1100 Gross per 24 hour  Intake 766 ml  Output 500 ml  Net 266 ml   LBM: Last BM Date : 03/23/22 Baseline Weight: Weight: 54.1 kg Most recent weight: Weight: 50.5 kg       Palliative Assessment/Data: PPS 50%      Patient Active Problem List   Diagnosis Date Noted   Pressure injury of skin 03/16/2022   Shortness of breath 03/16/2022   NSTEMI (non-ST elevated myocardial infarction) ([place]) 03/15/2022   Microcytic hypochromic anemia 03/15/2022   Leukocytosis 03/15/2022   Elevated troponin I level    Precordial pain    Aortic atherosclerosis (HCC)    Coronary atherosclerosis due to calcified coronary lesion    Cocaine use    Metastatic non-small cell lung cancer ([place])    Goals of care, counseling/discussion 03/06/2022   Lung cancer metastatic to brain ([place]) 03/06/2022   Non-small cell lung cancer ([place]) 02/12/2022   Smoking 02/12/2022   Healthcare maintenance 02/12/2022   Encounter for antineoplastic chemotherapy 02/12/2022   Cancer associated pain 02/12/2022   Adenopathy    Pneumonia of left lung  due to infectious organism 02/05/2022    Thrombocytosis 02/05/2022   Weight loss 02/05/2022   Lung mass    Right shoulder pain 01/18/2022   Cancer screening 09/10/2021   Need for immunization against influenza 05/03/2021   Protein-calorie malnutrition, severe ([place]) 05/03/2021   Tobacco use 05/03/2021   Immunization counseling 01/21/2021   Screening for STDs (sexually transmitted diseases) 10/16/2020   Dental caries 10/16/2020   Unspecified severe protein-calorie malnutrition ([place])    Bilateral interstitial pneumonia ([place])    Bilateral pneumonia 09/30/2020   HIV disease ([place]) 09/30/2020   Acute respiratory failure with hypoxia ([place]) 09/30/2020    Palliative Care Assessment & Plan   Patient Profile: 61 y.o. 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    Patient had just recently been hospitalized 9/6-9/8 with complaints of worsening cough and right shoulder pain.  Found to  metastatic disease with biopsy of the left supraclavicular noduleconglomeration bx showing non small cell carcinoma.   Patient now admitted with NSTEMI and CT angiogram of the chest obtained noting progressive malignancy, enlarging malignant left pleural effusion.  PMT has been consulted to assist with goals of care conversation.  Assessment: Principal Problem:   NSTEMI (non-ST elevated myocardial infarction) ([place]) Active Problems:   HIV disease ([place])   Acute respiratory failure with hypoxia (HCC)   Protein-calorie malnutrition, severe (HCC)   Thrombocytosis   Non-small cell lung cancer (HCC)   Microcytic hypochromic anemia   Leukocytosis   Elevated troponin I level   Precordial pain   Aortic atherosclerosis (HCC)   Coronary atherosclerosis due to calcified coronary lesion   Cocaine use   Metastatic non-small cell lung cancer (HCC)    Pressure injury of skin   Shortness of breath   Concern about end of life  Recommendations/Plan: Continue current supportive treatment Continue DNR/DNI as previously documented Patient would like to speak to attending to ensure there are no additional interventions that can offer a meaningful recovery prior to final decisions on comfort/hospice - Dr. Verlon Au notified Patient is agreeable for PMT follow up tomorrow 10/26 for continued [place] after speaking with attending [facility] notified and consulted for: patient's request to speak to someone about medical insurance questions PMT will continue to follow and support holistically  Goals of Care and Additional Recommendations: Limitations on Scope of Treatment: Full Scope Treatment  Code Status:    Code Status Orders  (From admission, onward)           Start     Ordered   03/15/22 1406  Do not attempt resuscitation (DNR)  Continuous       Question Answer Comment  In the event of cardiac or respiratory ARREST Do not call a "code blue"   In the event of cardiac or respiratory ARREST Do not perform Intubation, CPR, defibrillation or ACLS   In the event of cardiac or respiratory ARREST Use medication by any route, position, wound care, and other measures to relive pain and suffering. May use oxygen, suction and manual treatment of airway obstruction as needed for comfort.      03/15/22 1405           Code Status History     Date Active Date Inactive Code Status Order ID Comments User Context   03/15/2022 0826 03/15/2022 1405 Full Code [id number]  Fuller Plan  A, MD ED   02/05/2022 1504 02/07/2022 2126 Full Code [id number]  Norval Morton, MD ED   09/30/2020 1938 10/06/2020 1710 Full Code [id number]  Etta Quill, DO ED   02/17/2018 2045 02/18/2018 1433 Full Code [id number]  Ileana Roup, MD Inpatient   02/17/2018 1519 02/17/2018 2045 Full Code [id number]  Earnstine Regal, PA-C ED       Prognosis:  Poor  Discharge  Planning: To Be Determined  Care plan was discussed with primary RN, patient, Dr. Verlon Au, [facility]  Thank you for allowing the Palliative Medicine Team to assist in the care of this patient.   Total Time 50 minutes Prolonged Time Billed  no       Greater than 50%  of this time was spent counseling and coordinating care related to the above assessment and plan.  Lin Landsman, NP  Please contact Palliative Medicine Team phone at [phone] for questions and concerns.   *Portions of this note are a verbal dictation therefore any spelling and/or grammatical errors are due to the "[place] [employer]" system interpretation.

## 2022-03-27 DIAGNOSIS — R0602 Shortness of breath: Secondary | ICD-10-CM

## 2022-03-27 DIAGNOSIS — R531 Weakness: Secondary | ICD-10-CM

## 2022-03-27 DIAGNOSIS — J9601 Acute respiratory failure with hypoxia: Secondary | ICD-10-CM | POA: Diagnosis not present

## 2022-03-27 DIAGNOSIS — F1490 Cocaine use, unspecified, uncomplicated: Secondary | ICD-10-CM | POA: Diagnosis not present

## 2022-03-27 DIAGNOSIS — I214 Non-ST elevation (NSTEMI) myocardial infarction: Secondary | ICD-10-CM | POA: Diagnosis not present

## 2022-03-27 DIAGNOSIS — I700 Atherosclerosis of aorta: Secondary | ICD-10-CM | POA: Diagnosis not present

## 2022-03-27 LAB — COMPREHENSIVE METABOLIC PANEL
ALT: 15 U/L (ref 0–44)
AST: 26 U/L (ref 15–41)
Albumin: 1.7 g/dL — ABNORMAL LOW (ref 3.5–5.0)
Alkaline Phosphatase: 93 U/L (ref 38–126)
Anion gap: 10 (ref 5–15)
BUN: 15 mg/dL (ref 8–23)
CO2: 31 mmol/L (ref 22–32)
Calcium: 8.1 mg/dL — ABNORMAL LOW (ref 8.9–10.3)
Chloride: 93 mmol/L — ABNORMAL LOW (ref 98–111)
Creatinine, Ser: 0.8 mg/dL (ref 0.61–1.24)
GFR, Estimated: >60 mL/min (ref >60)
Glucose, Bld: 98 mg/dL (ref 70–99)
Potassium: 3.9 mmol/L (ref 3.5–5.1)
Sodium: 134 mmol/L — ABNORMAL LOW (ref 135–145)
Total Bilirubin: 0.5 mg/dL (ref 0.3–1.2)
Total Protein: 6.3 g/dL — ABNORMAL LOW (ref 6.5–8.1)

## 2022-03-27 LAB — CBC
HCT: 30.4 % — ABNORMAL LOW (ref 39.0–52.0)
Hemoglobin: 9.2 g/dL — ABNORMAL LOW (ref 13.0–17.0)
MCH: 23.4 pg — ABNORMAL LOW (ref 26.0–34.0)
MCHC: 30.3 g/dL (ref 30.0–36.0)
MCV: 77.2 fL — ABNORMAL LOW (ref 80.0–100.0)
Platelets: 817 10*3/uL — ABNORMAL HIGH (ref 150–400)
RBC: 3.94 MIL/uL — ABNORMAL LOW (ref 4.22–5.81)
RDW: 14.5 % (ref 11.5–15.5)
WBC: 13.9 10*3/uL — ABNORMAL HIGH (ref 4.0–10.5)
nRBC: 0 % (ref 0.0–0.2)

## 2022-03-27 NOTE — Progress Notes (Signed)
OT Cancellation Note  Patient Details Name: Steven Johnston MRN: [id number] DOB: 01-Oct-1960   Cancelled Treatment:    Reason Eval/Treat Not Completed: Other (comment). Pt reports he got up with a lady earlier and walked around and does not feel like getting up again right now. He was up with PT around 9:30 this morning and it is currently 11:34.  [place], OTR/L [facility] [phone] Office [phone]    Almon Register 03/27/2022, 11:33 AM

## 2022-03-27 NOTE — Progress Notes (Signed)
PROGRESS NOTE   Steven Johnston  QBH:[id number] DOB: 1961/06/02 DOA: 03/15/2022 PCP: Patient, No Pcp Per  Brief Narrative:  61 year old black male Underlying HIV on Biktarvy, hep B, prior suspect PJP pneumonia (09/2020) Recent diagnosis on 02/05/2022 admission --poorly differentiated NSCLC--+ brain metastases-subsequently receiving palliative XRT for scapular lesion last day XRT 03/12/2022--- patient started 10/12 on Libtayo every 3 weekly Continued tobacco, cocaine [UDS positive on admission 10/14]  Admit 10/14 found to have tachycardia tachypnea central chest pain and shortness of breath 10/14-given ASA 325 nitroglycerin EKG no ST-T wave elevation initial troponin 1212 WBC 1200 CXR worsening tumor burden versus superimposed process-CT chest without PE with worsening tumor burden and left pleural effusion Troponin increased to 246 Discussed with Dr. Terri Skains cardiology recommended EKG UDS heparin GTT Pulmonology consulted for trial of thoracentesis-on review of CT chest most of lung seem to be tumor tissue unlikely to respond to evacuation   Hospital-Problem based course  Metastatic NSCLC with hypoxia secondary to lung cancer Very poor prognosis--- agree with CCM input and with palliative input I do think he has probably less than a month I do not think he would be able to tolerate systemic chemo given his frailty appreciate PMT input And can continue Oxy IR 5 mg every 6 as needed for moderate pain  NSTEMI + STEMI on 10/17--troponin peak 6234 Cardiology recommending medical management Initially on Cardizem/labetalol but now is on amiodarone 200 daily, Plavix 75, aspirin 81 Cardiology placed the patient on Demadex -- x-rays revealed mainly lung tissue and not fluid-continue Demadex 10 q. other day Stop atorvastatin now--would stop ASA/Plavix on d/c  Hypotension in the setting of recent NSTEMI/STEMI Continue midodrine 2.5 3 times daily and graduate as able Stopped Nitropaste secondary to  hypotension HIV AIDS Continue Biktarvy  Severe malnutrition with BMI of 16  Leukocytosis Monitor trend  DVT prophylaxis: Lovenox Code Status: Full Family Communication: None Disposition:  Status is: Inpatient Remains inpatient appropriate because:   Planning for TOC placement to Hospice in am   Consultants:  Cardiology Palliative medicine   Procedures:    Antimicrobials: None currently   Subjective: Spoke with patient family [borther/s-I-l] He is waiting to talk with his dad before he decides on transition to comfort  Objective: Vitals:   03/27/22 0402 03/27/22 0729 03/27/22 0800 03/27/22 1200  BP: 98/64 95/75 (!) 74/46 101/72  Pulse: (!) 102 99 99 96  Resp: 20 20 19  (!) 22  Temp: 98.5 F (36.9 C) 99.1 F (37.3 C)    TempSrc: Oral Oral    SpO2: 94% 96% 100% 96%  Weight: 48 kg     Height:        Intake/Output Summary (Last 24 hours) at 03/27/2022 1643 Last data filed at 03/27/2022 0900 Gross per 24 hour  Intake 840 ml  Output 210 ml  Net 630 ml    Filed Weights   03/22/22 0340 03/23/22 0400 03/27/22 0402  Weight: 54.1 kg 50.5 kg 48 kg    Examination:  cachectic black male no distress  chest is clear no rales rhonchi-patient is very emaciated  Abdomen is soft no rebound No lower extremity edema Neuro intact seems sad  Data Reviewed: personally reviewed   CBC    Component Value Date/Time   WBC 13.9 (H) 03/27/2022 0044   RBC 3.94 (L) 03/27/2022 0044   HGB 9.2 (L) 03/27/2022 0044   HGB 11.7 (L) 03/13/2022 1158   HCT 30.4 (L) 03/27/2022 0044   PLT 817 (H) 03/27/2022 0044   PLT  656 (H) 03/13/2022 1158   MCV 77.2 (L) 03/27/2022 0044   MCH 23.4 (L) 03/27/2022 0044   MCHC 30.3 03/27/2022 0044   RDW 14.5 03/27/2022 0044   LYMPHSABS 1.3 03/13/2022 1158   MONOABS 1.1 (H) 03/13/2022 1158   EOSABS 0.5 03/13/2022 1158   BASOSABS 0.1 03/13/2022 1158      Latest Ref Rng & Units 03/27/2022   12:44 AM 03/25/2022    1:14 AM 03/23/2022   12:40  AM  CMP  Glucose 70 - 99 mg/dL 98  99  120   BUN 8 - 23 mg/dL 15  17  13    Creatinine 0.61 - 1.24 mg/dL 0.80  0.91  0.94   Sodium 135 - 145 mmol/L 134  135  135   Potassium 3.5 - 5.1 mmol/L 3.9  3.8  4.1   Chloride 98 - 111 mmol/L 93  94  97   CO2 22 - 32 mmol/L 31  30  29    Calcium 8.9 - 10.3 mg/dL 8.1  8.4  8.4   Total Protein 6.5 - 8.1 g/dL 6.3  6.7  6.5   Total Bilirubin 0.3 - 1.2 mg/dL 0.5  0.5  0.5   Alkaline Phos 38 - 126 U/L 93  94  97   AST 15 - 41 U/L 26  24  29    ALT 0 - 44 U/L 15  14  16       Radiology Studies: DG CHEST PORT 1 VIEW  Result Date: 03/26/2022 CLINICAL DATA:  Hypoxia EXAM: PORTABLE CHEST 1 VIEW COMPARISON:  03/23/2022 FINDINGS: No significant change in AP portable chest radiograph. Nearly complete opacification of the left hemithorax with dense, multifocal consolidation, and almost completely obscured cardiac silhouette. Scattered heterogeneous airspace opacities throughout the right lung, which is otherwise normally aerated. Osseous structures unremarkable. IMPRESSION: 1. No significant change in AP portable chest radiograph. Nearly complete opacification of the left hemithorax with dense, multifocal consolidation, and almost completely obscured cardiac silhouette. 2. Scattered heterogeneous airspace opacities throughout the right lung, which is otherwise normally aerated. Electronically Signed   By: Delanna Ahmadi M.D.   On: 03/26/2022 08:35     Scheduled Meds:  (feeding supplement) PROSource Plus  30 mL Oral TID BM   amiodarone  200 mg Oral Daily   aspirin  81 mg Oral Daily   atorvastatin  40 mg Oral QHS   bictegravir-emtricitabine-tenofovir AF  1 tablet Oral Daily   clopidogrel  75 mg Oral Daily   enoxaparin (LOVENOX) injection  40 mg Subcutaneous Q24H   feeding supplement  237 mL Oral TID BM   midodrine  2.5 mg Oral TID WC   multivitamin with minerals  1 tablet Oral Daily   sodium chloride flush  3 mL Intravenous Q12H   torsemide  10 mg Oral QODAY    Continuous Infusions:   LOS: 11 days   Time spent: [place], MD [facility] To contact the attending provider between 7A-7P or the covering provider during after hours 7P-7A, please log into the web site [email or web address] and access using universal [place] password for that web site. If you do not have the password, please call the hospital operator.  03/27/2022, 4:43 PM

## 2022-03-27 NOTE — Progress Notes (Signed)
Daily Progress Note   Patient Name: Steven Johnston       Date: 03/27/2022 DOB: 02/13/1961  Age: 61 y.o. MRN#: [id number] Attending Physician: Nita Sells, MD Primary Care Physician: Patient, No Pcp Per Admit Date: 03/15/2022  Reason for Consultation/Follow-up: Establishing goals of care  Subjective: Chart review performed. Received report from primary RN - no acute concerns. RN reports patient continues with poor appetite; symptoms of cough and pain are managed with current medications.  Went to visit patient at bedside - no family/visitors present. Patient was lying in bed asleep - he does wake to voice/gentle touch; however, is drowsy, falling asleep intermittently during visit (easily wakens with touch). No signs or non-verbal gestures of pain or discomfort noted. No respiratory distress, increased work of breathing, or secretions noted. Patient reports pain and cough are managed with current regimen. Reports feeling "alright." He is ill and frail appearing.  Emotional support provided. Briefly reviewed PMT conversation yesterday. He was able to speak with attending and states, "it went as best if can go."   Reviewed hospice/comfort care option. Patient discloses to me that he thinks his insurance will not cover hospice services - I am unfamiliar with his insurance and am unable to speak to this. Offered to discuss with TOC and have hospice liaison meet with him to review hospice options and what this would look like financially - he is agreeable.   We again talked about transition to comfort measures in house and what that would entail inclusive of medications to control pain, dyspnea, agitation, nausea, and itching. We discussed stopping all unnecessary measures such as blood draws,  needle sticks, oxygen, antibiotics, CBGs/insulin, cardiac monitoring, IVF, and frequent vital signs. Education provided that other non-pharmacological interventions would be utilized for holistic support and comfort such as spiritual support if requested, repositioning, music therapy, offering comfort feeds, and/or therapeutic listening. Patient would like to speak with hospice liaison prior to making decisions for comfort in house and asks PMT to follow up tomorrow.   All questions and concerns addressed. Encouraged to call with questions and/or concerns. PMT card provided.  Discussed hospice options in context of symptom burden and prognosis with TOC - they will offer choice to patient.   Length of Stay: 11  Current Medications: Scheduled Meds:   (feeding supplement) PROSource Plus  30 mL  Oral TID BM   amiodarone  200 mg Oral Daily   aspirin  81 mg Oral Daily   atorvastatin  40 mg Oral QHS   bictegravir-emtricitabine-tenofovir AF  1 tablet Oral Daily   clopidogrel  75 mg Oral Daily   enoxaparin (LOVENOX) injection  40 mg Subcutaneous Q24H   feeding supplement  237 mL Oral TID BM   midodrine  2.5 mg Oral TID WC   multivitamin with minerals  1 tablet Oral Daily   sodium chloride flush  3 mL Intravenous Q12H   torsemide  10 mg Oral QODAY    Continuous Infusions:   PRN Meds: acetaminophen **OR** acetaminophen, albuterol, benzonatate, HYDROcodone bit-homatropine, morphine injection, ondansetron **OR** ondansetron (ZOFRAN) IV, oxyCODONE  Physical Exam Vitals and nursing note reviewed.  Constitutional:      General: He is not in acute distress.    Appearance: He is cachectic. He is ill-appearing.  Pulmonary:     Effort: No respiratory distress.  Skin:    General: Skin is warm and dry.  Neurological:     Mental Status: He is alert and oriented to person, place, and time.     Motor: Weakness present.     Comments: drowsy  Psychiatric:        Attention and Perception: Attention  normal.        Behavior: Behavior is cooperative.        Cognition and Memory: Cognition and memory normal.             Vital Signs: BP (!) 74/46   Pulse 99   Temp 99.1 F (37.3 C) (Oral)   Resp 19   Ht 5\' 10"  (1.778 m)   Wt 48 kg   SpO2 100%   BMI 15.18 kg/m  SpO2: SpO2: 100 % O2 Device: O2 Device: High Flow Nasal Cannula O2 Flow Rate: O2 Flow Rate (L/min): 5 L/min  Intake/output summary:  Intake/Output Summary (Last 24 hours) at 03/27/2022 1052 Last data filed at 03/27/2022 0900 Gross per 24 hour  Intake 1008 ml  Output 310 ml  Net 698 ml   LBM: Last BM Date : 03/25/22 Baseline Weight: Weight: 54.1 kg Most recent weight: Weight: 48 kg       Palliative Assessment/Data: PPS 40%      Patient Active Problem List   Diagnosis Date Noted   Pressure injury of skin 03/16/2022   Shortness of breath 03/16/2022   NSTEMI (non-ST elevated myocardial infarction) ([place]) 03/15/2022   Microcytic hypochromic anemia 03/15/2022   Leukocytosis 03/15/2022   Elevated troponin I level    Precordial pain    Aortic atherosclerosis (HCC)    Coronary atherosclerosis due to calcified coronary lesion    Cocaine use    Metastatic non-small cell lung cancer (HCC)    Goals of care, counseling/discussion 03/06/2022   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   Non-small cell lung cancer ([place]) 02/12/2022   Smoking 02/12/2022   Healthcare maintenance 02/12/2022   Encounter for antineoplastic chemotherapy 02/12/2022   Cancer associated pain 02/12/2022   Adenopathy    Pneumonia of left lung due to infectious organism 02/05/2022   Thrombocytosis 02/05/2022   Weight loss 02/05/2022   Lung mass    Right shoulder pain 01/18/2022   Cancer screening 09/10/2021   Need for immunization against influenza 05/03/2021   Protein-calorie malnutrition, severe ([place]) 05/03/2021   Tobacco use 05/03/2021   Immunization counseling 01/21/2021   Screening for STDs (sexually transmitted diseases)  10/16/2020   Dental  caries 10/16/2020   Unspecified severe protein-calorie malnutrition (HCC)    Bilateral interstitial pneumonia (HCC)    Bilateral pneumonia 09/30/2020   HIV disease ([place]) 09/30/2020   Acute respiratory failure with hypoxia ([place]) 09/30/2020    Palliative Care Assessment & Plan   Patient Profile: 61 y.o. 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    Patient had just recently been hospitalized 9/6-9/8 with complaints of worsening cough and right shoulder pain.  Found to  metastatic disease with biopsy of the left supraclavicular noduleconglomeration bx showing non small cell carcinoma.   Patient now admitted with NSTEMI and CT angiogram of the chest obtained noting progressive malignancy, enlarging malignant left pleural effusion.  PMT has been consulted to assist with goals of care conversation.  Assessment: Principal Problem:   NSTEMI (non-ST elevated myocardial infarction) ([place]) Active Problems:   HIV disease ([place])   Acute respiratory failure with hypoxia (HCC)   Protein-calorie malnutrition, severe (HCC)   Thrombocytosis   Non-small cell lung cancer ([place])   Microcytic hypochromic anemia   Leukocytosis   Elevated troponin I level   Precordial pain   Aortic atherosclerosis (HCC)   Coronary atherosclerosis due to calcified coronary lesion   Cocaine use   Metastatic non-small cell lung cancer (HCC)   Pressure injury of skin   Shortness of breath   Concern about end of life  Recommendations/Plan: Continue current supportive treatment Continue DNR/DNI as previously documented Patient concerned hospice services will not be covered under his insurance. He is open to speak with TOC and hospice liaison to get additional information on hospice services  and how this would affect him financially TOC notified and consult placed for: hospice referral - liaison to speak with patient about residential vs home hospice in context of finances. Patient would most likely benefit from residential hospice admission if accepted  PMT will continue to follow and support holistically   Goals of Care and Additional Recommendations: Limitations on Scope of Treatment: Full Scope Treatment  Code Status:    Code Status Orders  (From admission, onward)           Start     Ordered   03/15/22 1406  Do not attempt resuscitation (DNR)  Continuous       Question Answer Comment  In the event of cardiac or respiratory ARREST Do not call a "code blue"   In the event of cardiac or respiratory ARREST Do not perform Intubation, CPR, defibrillation or ACLS   In the event of cardiac or respiratory ARREST Use medication by any route, position, wound care, and other measures to relive pain and suffering. May use oxygen, suction and manual treatment of airway obstruction as needed for comfort.      03/15/22 1405           Code Status History     Date Active Date Inactive Code Status Order ID Comments User Context   03/15/2022 0826 03/15/2022 1405 Full Code [id number]  Norval Morton, MD ED   02/05/2022 1504 02/07/2022 2126 Full Code [id number]  Norval Morton, MD ED   09/30/2020 1938 10/06/2020 1710 Full Code [id number]  Etta Quill, DO ED   02/17/2018 2045 02/18/2018 1433 Full Code [id number]  Ileana Roup, MD Inpatient   02/17/2018 1519 02/17/2018 2045 Full Code [id number]  Earnstine Regal, PA-C ED       Prognosis: Poor in the setting of age, multiple hospitalizations, severe malnutrition, worsening hypoxia secondary to tumor burden, and multiple comorbidities   Discharge Planning: To Be Determined  Care plan was discussed with primary RN, patient, TOC, Dr. Verlon Au  Thank you for allowing the Palliative Medicine Team to assist in the care of this  patient.   Total Time 40 minutes Prolonged Time Billed  no       Greater than 50%  of this time was spent counseling and coordinating care related to the above assessment and plan.  Lin Landsman, NP  Please contact Palliative Medicine Team phone at [phone] for questions and concerns.   *Portions of this note are a verbal dictation therefore any spelling and/or grammatical errors are due to the "[place] [employer]" system interpretation.

## 2022-03-27 NOTE — TOC Initial Note (Addendum)
Transition of Care [facility]) - Initial/Assessment Note    Patient Details  Name: Steven Johnston MRN: [id number] Date of Birth: November 11, 1960  [facility]) CM/SW Contact:    Bethann Berkshire, [place] Phone Number: 03/27/2022, 11:51 AM  Clinical Narrative:                  CSW notified by PMT that pt is considering hospice though has concerns about insurance and financials. He is agreeable to speak with a hospice rep regarding insurance/financials.Per PMT and attending, pt does not seem to be within 2 weeks of end of life and would likely not qualify for [facility] in [place] though would be appropriate for residential if another facility is able to take him clinically.   CSW met with pt and discussed situation and hospice facilities available. CSW offered choice and pt would prefer [facility] if he were to move forward with decision for Hospice. CSW will reach out to [place] to discuss financials with pt. TOC will then follow up with pt's decision on disposition.   CSW notified Cheri with [facility] [place]. They will reach out to pt to discuss financials/insurance.   1510: CSW is notified by [facility] that pt appears to have met his deductible and hospice should be covered 100%. They have no beds today so Cheri will meet with pt tomorrow morning to discuss this information.    Expected Discharge Plan: [place] (Possible Hospice facility vs home hospice) Barriers to Discharge: Inadequate or no insurance (Possible Hospice facility vs home hospice, [place])   Patient Goals and CMS Choice        Expected Discharge Plan and Services Expected Discharge Plan: [place] (Possible Hospice facility vs home hospice)       Living arrangements for the past 2 months: Single Family Home                                      Prior Living Arrangements/Services Living arrangements for the past 2 months:  Single Family Home Lives with:: Self Patient language and need for interpreter reviewed:: Yes        Need for Family Participation in Patient Care: No (Comment) Care giver support system in place?: No (comment)   Criminal Activity/Legal Involvement Pertinent to Current Situation/Hospitalization: No - Comment as needed  Activities of Daily Living Home Assistive Devices/Equipment: None ADL Screening (condition at time of admission) Patient's cognitive ability adequate to safely complete daily activities?: Yes Is the patient deaf or have difficulty hearing?: No Does the patient have difficulty seeing, even when wearing glasses/contacts?: No Does the patient have difficulty concentrating, remembering, or making decisions?: No Patient able to express need for assistance with ADLs?: Yes Does the patient have difficulty dressing or bathing?: Yes Independently performs ADLs?: No Communication: Independent Dressing (OT): Needs assistance Is this a change from baseline?: Pre-admission baseline Grooming: Needs assistance Is this a change from baseline?: Pre-admission baseline Feeding: Independent Bathing: Needs assistance Is this a change from baseline?: Pre-admission baseline Toileting: Needs assistance Is this a change from baseline?: Pre-admission baseline In/Out Bed: Needs assistance Is this a change from baseline?: Pre-admission baseline Walks in Home: Needs assistance Is this a change from baseline?: Pre-admission baseline Does the patient have difficulty walking or climbing stairs?: Yes Weakness of Legs: Both Weakness of Arms/Hands: None  Permission Sought/Granted  Emotional Assessment Appearance:: Appears stated age Attitude/Demeanor/Rapport: Lethargic Affect (typically observed): Accepting, Appropriate Orientation: : Oriented to Self, Oriented to Place, Oriented to Situation, Oriented to  Time Alcohol / Substance Use: Not Applicable Psych Involvement:  No (comment)  Admission diagnosis:  NSTEMI (non-ST elevated myocardial infarction) ([place]) [I21.4] Malignant neoplasm of overlapping sites of left lung (HCC) [C34.82] Acute respiratory failure with hypoxia ([place]) [J96.01] Patient Active Problem List   Diagnosis Date Noted   Pressure injury of skin 03/16/2022   Shortness of breath 03/16/2022   NSTEMI (non-ST elevated myocardial infarction) ([place]) 03/15/2022   Microcytic hypochromic anemia 03/15/2022   Leukocytosis 03/15/2022   Elevated troponin I level    Precordial pain    Aortic atherosclerosis (HCC)    Coronary atherosclerosis due to calcified coronary lesion    Cocaine use    Metastatic non-small cell lung cancer ([place])    Goals of care, counseling/discussion 03/06/2022   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   Non-small cell lung cancer ([place]) 02/12/2022   Smoking 02/12/2022   Healthcare maintenance 02/12/2022   Encounter for antineoplastic chemotherapy 02/12/2022   Cancer associated pain 02/12/2022   Adenopathy    Pneumonia of left lung due to infectious organism 02/05/2022   Thrombocytosis 02/05/2022   Weight loss 02/05/2022   Lung mass    Right shoulder pain 01/18/2022   Cancer screening 09/10/2021   Need for immunization against influenza 05/03/2021   Protein-calorie malnutrition, severe ([place]) 05/03/2021   Tobacco use 05/03/2021   Immunization counseling 01/21/2021   Screening for STDs (sexually transmitted diseases) 10/16/2020   Dental caries 10/16/2020   Unspecified severe protein-calorie malnutrition (HCC)    Bilateral interstitial pneumonia (HCC)    Bilateral pneumonia 09/30/2020   HIV disease ([place]) 09/30/2020   Acute respiratory failure with hypoxia ([place]) 09/30/2020   PCP:  Patient, No Pcp Per Pharmacy:   [facility] [place], [place] [address] [place] [address] Phone: [phone] Fax: [phone]  [facility] [address] [place] [address] Phone: [phone] Fax: [place] [address] Phone: [phone] Fax: [phone]     Social Determinants of Health (SDOH) Interventions    Readmission Risk Interventions     No data to display

## 2022-03-27 NOTE — Plan of Care (Signed)
  Problem: Education: Goal: Knowledge of General Education information will improve Description Including pain rating scale, medication(s)/side effects and non-pharmacologic comfort measures Outcome: Progressing   

## 2022-03-27 NOTE — Progress Notes (Signed)
Physical Therapy Treatment Patient Details Name: Steven Johnston MRN: [id number] DOB: September 22, 1960 Today's Date: 03/27/2022   History of Present Illness Pt is a 61 y/o male admitted 03/15/21 with chest pain, admitted for NSTEMI and acute respiratory failure with hypoxia. Code STEMI called 10/17; per cardiology, do not currently recommend cardiac cath at this time due to medical comorbidities. 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    PT Comments    With encouragement pt agrees to get up to chair with therapy. Pt reports increased fatigue today and takes more effort to come to EoB. Once EoB needs rest break before attempting to stand. Although pt had not been using AD prior, PT had set up RW prior to getting up. Pt able to power up and self steady, however request RW for support. Pt reports desire to walk in hallway, however once at door HR appears to have dropped into 80s with poor lead attachment, pt request to return to chair. With return to chair HR elevated to 110s and then settles back to 105 bpm. In light of pt poor prognosis and decreased ability to mobilize may need to revisit discharge plans. PT will continue to follow to assess.     Recommendations for follow up therapy are one component of a multi-disciplinary discharge planning process, led by the attending physician.  Recommendations may be updated based on patient status, additional functional criteria and insurance authorization.  Follow Up Recommendations  Home health PT     Assistance Recommended at Discharge PRN  Patient can return home with the following Help with stairs or ramp for entrance;Assistance with cooking/housework;Assist for transportation   Equipment Recommendations  None recommended by PT       Precautions / Restrictions Precautions Precautions:  Fall;Other (comment) Precaution Comments: Watch SpO2 (does not wear baseline) Restrictions Weight Bearing Restrictions: No     Mobility  Bed Mobility Overal bed mobility: Needs Assistance       Supine to sit: Supervision, HOB elevated     General bed mobility comments: supervision for safety, increased time, use of bedrails  and effort required to come to sitting EoB, vc for scooting forward for feet to be on floor    Transfers Overall transfer level: Needs assistance Equipment used: None Transfers: Sit to/from Stand Sit to Stand: Min guard           General transfer comment: close min guard for safety, increased effort to gain balance, once balance achieved requests RW for support    Ambulation/Gait Ambulation/Gait assistance: Min guard Gait Distance (Feet): 20 Feet Assistive device: None Gait Pattern/deviations: Step-through pattern, Decreased stride length, Trunk flexed Gait velocity: Decreased Gait velocity interpretation: <1.31 ft/sec, indicative of household ambulator   General Gait Details: very slowed, unsteady gait, with increased fatigue by the time he reached the door, requested to return to chair       Balance Overall balance assessment: Modified Independent   Sitting balance-Leahy Scale: Good     Standing balance support: No upper extremity supported, During functional activity Standing balance-Leahy Scale: Poor                              Cognition Arousal/Alertness: Awake/alert Behavior During Therapy: WFL for tasks assessed/performed, Flat affect Overall Cognitive Status: Within Functional Limits for tasks assessed  General Comments General comments (skin integrity, edema, etc.): BP 91/62, HR 105 at rest, dropped initially with gait to 80s however tele leads not fully adhered, SpO2 on 6L O2 for ambulation in 90s% O2      Pertinent Vitals/Pain Pain  Assessment Pain Assessment: No/denies pain Faces Pain Scale: Hurts a little bit Pain Location: chest with ambulation Pain Descriptors / Indicators: Grimacing Pain Intervention(s): Limited activity within patient's tolerance, Monitored during session     PT Goals (current goals can now be found in the care plan section) Acute Rehab PT Goals PT Goal Formulation: With patient Time For Goal Achievement: 04/01/22 Potential to Achieve Goals: Fair Progress towards PT goals: Not progressing toward goals - comment    Frequency    Min 3X/week      PT Plan Current plan remains appropriate       AM-PAC PT "6 Clicks" Mobility   Outcome Measure  Help needed turning from your back to your side while in a flat bed without using bedrails?: None Help needed moving from lying on your back to sitting on the side of a flat bed without using bedrails?: None Help needed moving to and from a bed to a chair (including a wheelchair)?: A Little Help needed standing up from a chair using your arms (e.g., wheelchair or bedside chair)?: A Little Help needed to walk in hospital room?: A Little Help needed climbing 3-5 steps with a railing? : A Little 6 Click Score: 20    End of Session Equipment Utilized During Treatment: Gait belt;Oxygen Activity Tolerance: Patient limited by lethargy;Patient limited by fatigue Patient left: in chair;with call bell/phone within reach;with chair alarm set Nurse Communication: Mobility status PT Visit Diagnosis: Difficulty in walking, not elsewhere classified (R26.2)     Time: 1655-3748 PT Time Calculation (min) (ACUTE ONLY): 25 min  Charges:  $Gait Training: 8-22 mins $Therapeutic Exercise: 8-22 mins                     Shakiyla Kook B. Migdalia Dk PT, DPT Acute Rehabilitation Services Please use secure chat or  Call Office [phone]    [place] 03/27/2022, 11:25 AM

## 2022-03-28 ENCOUNTER — Other Ambulatory Visit (HOSPITAL_COMMUNITY): Payer: Self-pay

## 2022-03-28 DIAGNOSIS — Z515 Encounter for palliative care: Secondary | ICD-10-CM

## 2022-03-28 DIAGNOSIS — I214 Non-ST elevation (NSTEMI) myocardial infarction: Secondary | ICD-10-CM | POA: Diagnosis not present

## 2022-03-28 LAB — CBC
HCT: 30.3 % — ABNORMAL LOW (ref 39.0–52.0)
Hemoglobin: 9.5 g/dL — ABNORMAL LOW (ref 13.0–17.0)
MCH: 24.1 pg — ABNORMAL LOW (ref 26.0–34.0)
MCHC: 31.4 g/dL (ref 30.0–36.0)
MCV: 76.9 fL — ABNORMAL LOW (ref 80.0–100.0)
Platelets: 790 10*3/uL — ABNORMAL HIGH (ref 150–400)
RBC: 3.94 MIL/uL — ABNORMAL LOW (ref 4.22–5.81)
RDW: 14.6 % (ref 11.5–15.5)
WBC: 13.8 10*3/uL — ABNORMAL HIGH (ref 4.0–10.5)
nRBC: 0 % (ref 0.0–0.2)

## 2022-03-28 LAB — COMPREHENSIVE METABOLIC PANEL
ALT: 13 U/L (ref 0–44)
AST: 26 U/L (ref 15–41)
Albumin: 1.6 g/dL — ABNORMAL LOW (ref 3.5–5.0)
Alkaline Phosphatase: 92 U/L (ref 38–126)
Anion gap: 10 (ref 5–15)
BUN: 14 mg/dL (ref 8–23)
CO2: 31 mmol/L (ref 22–32)
Calcium: 8.3 mg/dL — ABNORMAL LOW (ref 8.9–10.3)
Chloride: 94 mmol/L — ABNORMAL LOW (ref 98–111)
Creatinine, Ser: 0.74 mg/dL (ref 0.61–1.24)
GFR, Estimated: >60 mL/min (ref >60)
Glucose, Bld: 91 mg/dL (ref 70–99)
Potassium: 3.9 mmol/L (ref 3.5–5.1)
Sodium: 135 mmol/L (ref 135–145)
Total Bilirubin: 0.4 mg/dL (ref 0.3–1.2)
Total Protein: 6.2 g/dL — ABNORMAL LOW (ref 6.5–8.1)

## 2022-03-28 MED ORDER — AMIODARONE HCL 200 MG PO TABS
200.0000 mg | ORAL_TABLET | Freq: Every day | ORAL | 0 refills | Status: AC
  Filled 2022-03-28: qty 30, 30d supply, fill #0

## 2022-03-28 MED ORDER — MORPHINE SULFATE (CONCENTRATE) 10 MG /0.5 ML PO SOLN: 10.0000 mg | ORAL | 0 refills | Status: AC | PRN

## 2022-03-28 NOTE — TOC Progression Note (Addendum)
Transition of Care [facility]) - Progression Note    Patient Details  Name: Steven Johnston MRN: [id number] Date of Birth: January 01, 1961  Transition of Care [facility]) CM/SW [place], [place] Phone Number: 03/28/2022, 10:48 AM  Clinical Narrative:     CSW received update from [place] with [facility] that she spoke with pt about financials. [facility] is reviewing pt to determine if they can accept pt clinically.   1125: CSW notified that [facility] can accept pt clinically. They dont have a bed at [facility] location but do at [place] location. [facility] will discuss with pt and pt father. Father supposed to arrive at noon.    Expected Discharge Plan: [place] (Possible Hospice facility vs home hospice) Barriers to Discharge: Inadequate or no insurance (Possible Hospice facility vs home hospice, [place])  Expected Discharge Plan and Services Expected Discharge Plan: [place] (Possible Hospice facility vs home hospice)       Living arrangements for the past 2 months: Single Family Home Expected Discharge Date: 03/28/22                                     Social Determinants of Health (SDOH) Interventions    Readmission Risk Interventions     No data to display

## 2022-03-28 NOTE — Progress Notes (Signed)
Report called to [facility] at [facility]

## 2022-03-28 NOTE — Discharge Summary (Signed)
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 now on hospice  Please see below for itemized issues addressed in [facility]- refer to other progress notes for clarity if needed  Discharge Condition: Guarded  Diet recommendation: Comfort  Filed Weights   03/23/22 0400 03/27/22 0402 03/28/22 0007  Weight: 50.5 kg 48 kg 49 kg    History of present illness:  61 year old black male Underlying HIV on Biktarvy, hep B, prior suspect PJP pneumonia (09/2020) Recent diagnosis on 02/05/2022 admission --poorly differentiated NSCLC--+ brain metastases-subsequently receiving palliative XRT for scapular lesion last day XRT 03/12/2022--- patient started 10/12 on Libtayo every 3 weekly Continued tobacco, cocaine [UDS positive on admission 10/14]   Admit 10/14 found to have tachycardia tachypnea central chest pain and shortness of breath 10/14-given ASA 325 nitroglycerin EKG no ST-T wave elevation initial troponin 1212 WBC 1200 CXR worsening tumor burden versus superimposed process-CT chest without PE with worsening tumor burden and left pleural effusion Troponin increased to 246 Discussed with Dr. Terri Skains cardiology recommended EKG UDS heparin GTT Pulmonology consulted for trial of thoracentesis-on review of CT chest most of lung seem to be tumor tissue unlikely to respond to evacuation  Hospital Course:  Metastatic NSCLC with hypoxia secondary to lung cancer Very poor prognosis--- agree with CCM input/palliative care and goals consolidated to DNR/DNI and comfort care Discharging on sublingual morphine, add other as  needed comfort meds as per hospice oxygen as needed    NSTEMI + STEMI on 10/17--troponin peak 6234 Cardiology recommending medical management Initially on Cardizem/labetalol--discharging on amiodarone 200 daily, have discontinued Plavix 75, aspirin 81 Cardiology placed the patient on Demadex which has since been discontinued    Hypotension in the setting of recent NSTEMI/STEMI Midodrine given and this was stopped Stopped Nitropaste secondary to hypotension  HIV AIDS May continue Biktarvy can probably discontinue in outpatient setting  Severe malnutrition with BMI of 16  Decubitus ulcers from prior to admission   Leukocytosis No work-up beyond what has been done-  Discharge Exam: Vitals:   03/28/22 0406 03/28/22 0800  BP: 95/70 92/67  Pulse: 100 96  Resp: (!) 23 (!) 23  Temp: 98.4 F (36.9 C) 97.7 F (36.5 C)  SpO2: 99% 97%    Subj on day of d/c   Awake coherent seems to understand plan-resigned to it  General Exam on discharge  Cachectic black male no distress EOMI NCAT no focal deficit No icterus no pallor no wheeze no rales rhonchi S1-S2 no murmur Abdomen soft scaphoid No lower extremity edema Decubitus ulcer noted  Discharge Instructions   Discharge Instructions     Diet - low sodium heart healthy   Complete by: As directed    Increase activity slowly   Complete by: As directed    No wound care   Complete by: As directed       Allergies as of 03/28/2022   No Known Allergies      Medication List     STOP taking these medications    mirtazapine 7.5 MG tablet Commonly known as: REMERON   nicotine 14 mg/24hr patch Commonly known as: NICODERM CQ - dosed in mg/24 hours   oxyCODONE 5 MG immediate  release tablet Commonly known as: Roxicodone   prochlorperazine 10 MG tablet Commonly known as: COMPAZINE       TAKE these medications    albuterol 108 (90 Base) MCG/ACT inhaler Commonly known as: VENTOLIN HFA Inhale 2 puffs into the lungs  every 6 (six) hours as needed for wheezing or shortness of breath.   amiodarone 200 MG tablet Commonly known as: PACERONE Take 1 tablet (200 mg total) by mouth daily. Start taking on: March 29, 2022   benzonatate 100 MG capsule Commonly known as: TESSALON Take 2 capsules (200 mg total) by mouth 3 (three) times daily as needed for cough.   bictegravir-emtricitabine-tenofovir AF 50-200-25 MG Tabs tablet Commonly known as: BIKTARVY Take 1 tablet by mouth daily.   diclofenac Sodium 1 % Gel Commonly known as: VOLTAREN Apply 2 g topically 4 (four) times daily.   morphine CONCENTRATE 10 mg / 0.5 ml concentrated solution Take 0.5 mLs (10 mg total) by mouth every 3 (three) hours as needed for severe pain.       No Known Allergies    The results of significant diagnostics from this hospitalization (including imaging, microbiology, ancillary and laboratory) are listed below for reference.    Significant Diagnostic Studies: DG CHEST PORT 1 VIEW  Result Date: 03/26/2022 CLINICAL DATA:  Hypoxia EXAM: PORTABLE CHEST 1 VIEW COMPARISON:  03/23/2022 FINDINGS: No significant change in AP portable chest radiograph. Nearly complete opacification of the left hemithorax with dense, multifocal consolidation, and almost completely obscured cardiac silhouette. Scattered heterogeneous airspace opacities throughout the right lung, which is otherwise normally aerated. Osseous structures unremarkable. IMPRESSION: 1. No significant change in AP portable chest radiograph. Nearly complete opacification of the left hemithorax with dense, multifocal consolidation, and almost completely obscured cardiac silhouette. 2. Scattered heterogeneous airspace opacities throughout the right lung, which is otherwise normally aerated. Electronically Signed   By: Delanna Ahmadi M.D.   On: 03/26/2022 08:35   DG CHEST PORT 1 VIEW  Result Date: 03/23/2022 CLINICAL DATA:  Hypoxia, chest pain. EXAM: PORTABLE CHEST 1 VIEW  COMPARISON:  Chest x-rays dated 03/15/2022 and 02/06/2022. FINDINGS: Stable appearance of the masslike consolidation involving the LEFT upper lung. The additional opacities within the lower LEFT lung are stable compared to the most recent chest x-ray of 03/15/2022 but increased compared to the earlier chest x-ray of 02/06/2022. Increased interstitial prominence throughout the RIGHT lung, with a slightly more confluent opacity in the RIGHT infrahilar region. No pleural effusion or pneumothorax is seen. Heart size and mediastinal contours are grossly stable. Destructive/lytic lesion within the RIGHT scapula/glenoid, as previously described. No new osseous abnormality identified. IMPRESSION: 1. Increased interstitial prominence throughout the RIGHT lung, with a slightly more confluent opacity in the RIGHT infrahilar region, most likely pulmonary edema, although a developing contralateral lymphangitic carcinomatosis can not be excluded. 2. Stable appearance of the masslike consolidation involving the LEFT upper lung, with additional opacities in the lower LEFT lung which are stable compared to the most recent chest x-ray of 03/15/2022, compatible with the bronchogenic neoplastic process and lymphangitic carcinomatosis described on PET-CT report of 02/26/2022. 3. Destructive/lytic lesion within the RIGHT scapula/glenoid. Electronically Signed   By: Franki Cabot M.D.   On: 03/23/2022 10:16   ECHOCARDIOGRAM COMPLETE  Result Date: 03/15/2022    ECHOCARDIOGRAM REPORT   Patient Name:   Steven Johnston Date of Exam: 03/15/2022 Medical Rec #:  [id number]       Height:       70.0 in Accession #:  [id number]      Weight:       119.3 lb Date of Birth:  April 05, 1961        BSA:          1.676 m Patient Age:    59 years        BP:           107/70 mmHg Patient Gender: M               HR:           84 bpm. Exam Location:  Inpatient Procedure: 2D Echo, 3D Echo, Cardiac Doppler, Color Doppler, Intracardiac            Opacification  Agent and Strain Analysis Indications:    NSTEMI  History:        Patient has no prior history of Echocardiogram examinations.                 Signs/Symptoms:Chest Pain. HIV. Lung cancer.  Sonographer:    Merrie Roof RDCS Referring Phys: [id number] [place]  1. Left ventricular ejection fraction, by estimation, is 60 to 65%. The left ventricle has normal function. The left ventricle has no regional wall motion abnormalities. Left ventricular diastolic parameters were normal.  2. Right ventricular systolic function is normal. The right ventricular size is visually dilated (mild).  3. Left atrial size was moderately dilated.  4. Small to moderate size pericardial effusion. The pericardial effusion is posterior to the left ventricle. There is no evidence of cardiac tamponade. Large pleural effusion in the left lateral region.  5. The mitral valve is degenerative. Trivial mitral valve regurgitation. No evidence of mitral stenosis.  6. The aortic valve is grossly normal. Aortic valve regurgitation is not visualized. No aortic stenosis is present.  7. Aortic Proximal ascending aorta normal in size. There is borderline dilatation of the aortic root, measuring 38 mm.  8. The inferior vena cava is normal in size with greater than 50% respiratory variability, suggesting right atrial pressure of 3 mmHg. Comparison(s): No prior Echocardiogram. Conclusion(s)/Recommendation(s): No left ventricular mural or apical thrombus/thrombi. FINDINGS  Left Ventricle: Left ventricular ejection fraction, by estimation, is 60 to 65%. The left ventricle has normal function. The left ventricle has no regional wall motion abnormalities. Definity contrast agent was given IV to delineate the left ventricular  endocardial borders. Global longitudinal strain performed but not reported based on interpreter judgement due to suboptimal tracking. The left ventricular internal cavity size was normal in size. There is no left ventricular  hypertrophy. Left ventricular diastolic parameters were normal. Right Ventricle: The right ventricular size is visually dilated (mild). No increase in right ventricular wall thickness. Right ventricular systolic function is normal. Left Atrium: Left atrial size was moderately dilated. Right Atrium: Right atrial size was not well visualized. Pericardium: Small to moderate size pericardial effusion. The pericardial effusion is posterior to the left ventricle. There is no evidence of cardiac tamponade. Mitral Valve: The mitral valve is degenerative in appearance. There is moderate thickening of the mitral valve leaflet(s). Normal mobility of the mitral valve leaflets. Trivial mitral valve regurgitation. No evidence of mitral valve stenosis. Tricuspid Valve: The tricuspid valve is grossly normal. Tricuspid valve regurgitation is mild . No evidence of tricuspid stenosis. Aortic Valve: The aortic valve is grossly normal. Aortic valve regurgitation is not visualized. No aortic stenosis is present. Aortic valve mean gradient measures 2.0 mmHg. Aortic valve peak gradient measures 4.5 mmHg. Aortic valve area, by VTI measures  3.07 cm. Pulmonic Valve: The pulmonic valve was normal in structure. Pulmonic valve regurgitation is not visualized. No evidence of pulmonic stenosis. Aorta: Proximal ascending aorta normal in size. There is borderline dilatation of the aortic root, measuring 38 mm. Venous: The inferior vena cava is normal in size with greater than 50% respiratory variability, suggesting right atrial pressure of 3 mmHg. IAS/Shunts: The interatrial septum was not well visualized. Additional Comments: There is a large pleural effusion in the left lateral region.  LEFT VENTRICLE PLAX 2D LVIDd:         3.70 cm     Diastology LVIDs:         2.50 cm     LV e' medial:    11.60 cm/s LV PW:         1.00 cm     LV E/e' medial:  8.2 LV IVS:        1.10 cm     LV e' lateral:   11.85 cm/s LVOT diam:     2.20 cm     LV E/e' lateral:  8.1 LV SV:         60 LV SV Index:   36 LVOT Area:     3.80 cm                             3D Volume EF: LV Volumes (MOD)           3D EF:        71 % LV vol d, MOD A2C: 92.7 ml LV EDV:       162 ml LV vol d, MOD A4C: 62.4 ml LV ESV:       47 ml LV vol s, MOD A2C: 23.4 ml LV SV:        115 ml LV vol s, MOD A4C: 20.0 ml LV SV MOD A2C:     69.3 ml LV SV MOD A4C:     62.4 ml LV SV MOD BP:      60.4 ml RIGHT VENTRICLE             IVC RV Basal diam:  4.60 cm     IVC diam: 1.60 cm RV S prime:     12.30 cm/s TAPSE (M-mode): 2.1 cm LEFT ATRIUM             Index LA diam:        2.10 cm 1.25 cm/m LA Vol (A2C):   82.0 ml 48.94 ml/m LA Vol (A4C):   86.2 ml 51.44 ml/m LA Biplane Vol: 85.6 ml 51.09 ml/m  AORTIC VALVE AV Area (Vmax):    3.16 cm AV Area (Vmean):   3.16 cm AV Area (VTI):     3.07 cm AV Vmax:           106.00 cm/s AV Vmean:          71.900 cm/s AV VTI:            0.197 m AV Peak Grad:      4.5 mmHg AV Mean Grad:      2.0 mmHg LVOT Vmax:         88.00 cm/s LVOT Vmean:        59.700 cm/s LVOT VTI:          0.159 m LVOT/AV VTI ratio: 0.81  AORTA Ao Root diam: 3.80 cm Ao Asc diam:  2.90 cm MITRAL VALVE MV Area (PHT): 5.66  cm    SHUNTS MV Decel Time: 134 msec    Systemic VTI:  0.16 m MV E velocity: 95.40 cm/s  Systemic Diam: 2.20 cm MV A velocity: 68.10 cm/s MV E/A ratio:  1.40 Sunit Tolia DO Electronically signed by Rex Kras DO Signature Date/Time: 03/15/2022/12:45:23 PM    Final    CT Angio Chest PE W and/or Wo Contrast  Result Date: 03/15/2022 CLINICAL DATA:  61 year old male with central chest pain and shortness of breath this evening. History of lung cancer, currently undergoing chemotherapy. EXAM: CT ANGIOGRAPHY CHEST WITH CONTRAST TECHNIQUE: Multidetector CT imaging of the chest was performed using the standard protocol during bolus administration of intravenous contrast. Multiplanar CT image reconstructions and MIPs were obtained to evaluate the vascular anatomy. RADIATION DOSE REDUCTION: This  exam was performed according to the departmental dose-optimization program which includes automated exposure control, adjustment of the mA and/or kV according to patient size and/or use of iterative reconstruction technique. CONTRAST:  76mL OMNIPAQUE IOHEXOL 350 MG/ML SOLN COMPARISON:  PET-CT 02/26/2022.  Chest CT 02/05/2022. FINDINGS: Cardiovascular: No filling defects within the pulmonary arterial tree to suggest pulmonary embolism. Heart size is normal. No definite pericardial fluid or thickening. No pericardial calcification. Atherosclerotic calcifications are noted in the left main, left anterior descending, left circumflex and right coronary arteries. Mediastinum/Nodes: Numerous borderline enlarged and enlarged mediastinal and bilateral hilar lymph nodes are noted measuring up to 1.7 cm in the low right paratracheal nodal station and 1.8 cm in the right hilar region. Bulky soft tissues also noted in the left hilar region, contiguous with adjacent abnormal soft tissue along, difficult to accurately measure. Esophagus is unremarkable in appearance. No axillary lymphadenopathy. Left-sided supraclavicular lymphadenopathy measuring up to 1.6 cm in short axis (axial image 26 of series 5). Lungs/Pleura: Increasingly apparent nodular opacities in the base of the right lung measuring up to 2.8 x 1.7 cm (axial image 149 of series 6), likely progressive metastatic lesions. Multiple other smaller pulmonary nodules scattered elsewhere in the right lung. Extensive mass-like consolidation throughout the left lung, most severe in the left upper lobe but with additional areas of nodularity and irregular nodular septal thickening throughout the left lower lobe, progressive compared to the prior examination. Much of this in the left upper lobe likely represents tumor with additional postobstructive changes, and evolving lymphangitic spread of disease throughout the entirety of the left lung. Small left pleural effusion  predominantly subpulmonic and in the dependent portion of the base of the left hemithorax. No right pleural effusion. Upper Abdomen: Soft tissue surrounding the aorta in the upper abdomen, presumably lymphadenopathy, poorly evaluated on today's arterial phase examination. Musculoskeletal: Large infiltrative lytic lesion in the right scapula centered in the region of the glenoid. Review of the MIP images confirms the above findings. IMPRESSION: 1. No evidence of pulmonary embolism. 2. Progressive thoracic malignancy with increasing mass-like consolidation of the left upper lobe, progressive lymphangitic spread of tumor throughout the left lung, enlarging malignant left pleural effusion, enlarging metastatic pulmonary nodules in the right lung, lymphadenopathy in the left supraclavicular, retroperitoneal, mediastinal and bilateral hilar nodal stations, and osseous metastatic disease in the right scapula, as above. 3. Aortic atherosclerosis, in addition to left main and three-vessel coronary artery disease. Aortic Atherosclerosis (ICD10-I70.0). Electronically Signed   By: Vinnie Langton M.D.   On: 03/15/2022 05:32   DG Chest 2 View  Result Date: 03/15/2022 CLINICAL DATA:  Chest pain and increasing shortness of breath, history of lung carcinoma EXAM: CHEST - 2 VIEW  COMPARISON:  02/26/2022 PET CT, plain film from 02/06/2022 FINDINGS: Persistent masslike consolidation is noted apex of the left lung with increasing pleural based density laterally when compared with the prior plain film examination. There is also likely increasing effusion present as well as neoplastic involvement in the non consolidated lung. Right lung is mildly hyperinflated. The erosive changes in the right scapula are again identified but better visualized on prior PET-CT. IMPRESSION: Progressive consolidation involving the left lung. The majority of this is related to the known neoplasm although a superimposed more acute process such as  pneumonia may be present as well given the abrupt onset. Right lung remains clear. Electronically Signed   By: Inez Catalina M.D.   On: 03/15/2022 02:26   MR BRAIN W WO CONTRAST  Result Date: 02/27/2022 CLINICAL DATA:  Non-small cell lung cancer, staging EXAM: MRI HEAD WITHOUT AND WITH CONTRAST TECHNIQUE: Multiplanar, multiecho pulse sequences of the brain and surrounding structures were obtained without and with intravenous contrast. CONTRAST:  5 mL Vueway COMPARISON:  No prior MRI, correlation is made with CT head 10/13/2020 FINDINGS: Evaluation is quite limited by motion, particularly the postcontrast T1 sequences. In addition, suboptimal, faster sequences had to be run to minimize the affect of motion. Brain: Round enhancing lesion in the left inferolateral frontal lobe, which measures 10 x 10 x 9 mm (AP x TR x CC) (series 16, image 17 and series 17, image 20). This lesion is associated with significant surrounding edema and mild local mass effect (series 9, image 29). It is also associated with a focus of restricted diffusion (series 5, image 26). Additional 7 x 3 x 4 mm enhancing lesion in the posterior right frontal lobe (series 16, image 20 and series 18, image 8), which is associated with restricted diffusion (series 5, image 44) and edema (series 9, image 43). No other definite enhancing lesion is seen, although motion significantly limits evaluation for small lesions; however, there are small foci of restricted diffusion in the left thalamus (series 5, image 28), left parietal lobe (series 5, image 32) and right occipital lobe (series 5, image 26). These areas are associated with minimally increased T2 signal and could represent additional small metastases, the enhancement of which is obscured by motion, or acute infarcts. No hydrocephalus or extra-axial collection.  No midline shift. Vascular: Grossly normal arterial flow voids. Skull and upper cervical spine: Grossly normal marrow signal, although  this is significantly motion limited. Sinuses/Orbits: Mucosal thickening in the right frontal sinus. The orbits are unremarkable. Other: The mastoids are well aerated. IMPRESSION: 1. Evaluation is limited by motion, particularly the postcontrast T1 sequences. Within this limitation, there are two enhancing lesions in the left frontal lobe and right posterior frontal lobe, which are associated with restricted diffusion and edema, concerning for metastatic disease. 2. Small foci of restricted diffusion in the left thalamus, left parietal lobe, and right occipital lobe, which are associated with minimally increased T2 signal and could represent additional small metastases, although evaluation for enhancement is obscured by motion, versus acute/subacute infarcts. Attention on follow-up. Electronically Signed   By: Merilyn Baba M.D.   On: 02/27/2022 20:00   NM PET Image Initial (PI) Skull Base To Thigh (F-18 FDG)  Result Date: 02/27/2022 CLINICAL DATA:  Initial treatment strategy for non-small cell lung cancer staging evaluation. EXAM: NUCLEAR MEDICINE PET SKULL BASE TO THIGH TECHNIQUE: 6.25 mCi F-18 FDG was injected intravenously. Full-ring PET imaging was performed from the skull base to thigh after the radiotracer. CT  data was obtained and used for attenuation correction and anatomic localization. Fasting blood glucose: 77 mg/dl COMPARISON:  CT of the chest February 05, 2022 FINDINGS: Mediastinal blood pool activity: SUV max 1.78 Liver activity: SUV max NA NECK: Level III lymph nodes (image 43/4) LEFT-sided lymph nodes measuring approximately 13 mm with a maximum SUV of 6.23. RIGHT level IV lymph nodes with similar activity measuring less than a cm. Supraclavicular lymph nodes bilaterally. Largest on the LEFT with a maximum SUV of 6.23 is indistinct and difficult to measure likely 13 mm short axis. Incidental CT findings: None. CHEST: Extensive consolidative changes more confluent in the LEFT upper lobe than on  recent imaging (image 70/4) confluent consolidative changes with marked hypermetabolic activity measuring 10.1 x 6.3 cm with a maximum SUV of 14.46. Scattered additional areas are contiguous with this LEFT upper lobe process, extending into the lingula and superior segment of the LEFT lower lobe and associated with diffuse septal thickening and nodularity also with areas of marked hypermetabolic activity throughout the more densely consolidative areas in the chest and with mild-to-moderate activity in patchy areas of nodularity. Spiculated RIGHT lower lobe pulmonary nodule 7 mm (image 93/4) not substantially hypermetabolic with SUV of 6.14. Extensive mediastinal and bilateral hilar adenopathy as well as supraclavicular adenopathy discussed above. Lymph nodes track along the entire RIGHT supraclavicular region and there is adjacent destruction of the RIGHT scapula. (Image 72/4) maximum SUV of 10.58 of a 14 mm RIGHT paratracheal lymph node. Enlargement of RIGHT hilar lymph nodes with maximum SUV of 8.71. Subcarinal and RIGHT peribronchial nodal enlargement with subcarinal nodal tissue showing a maximum SUV of 9.15 measuring approximately 11 mm greatest thickness. RIGHT axillary adenopathy with increased metabolic activity similar to supraclavicular activity seen in the RIGHT supraclavicular region. Incidental CT findings: Mass in the LEFT upper lobe and lingula inseparable from the LEFT heart border and LEFT mediastinal border likely invading mediastinal fat. No substantial pericardial effusion. Three-vessel coronary artery disease. No aortic dilation. Background pulmonary emphysema is moderate and worse at the lung apices. Airways to the RIGHT chest are patent. ABDOMEN/PELVIS: Upper abdominal adenopathy and retroperitoneal adenopathy with increased metabolic activity. Hepatic gastric lymph node (image 121/4) maximum SUV 6.93 measuring 14 mm short axis. Similar lymph node to the RIGHT of the celiac axis showing  similar increased metabolic activity. LEFT para-aortic nodal tissue (image 132/4) this measures 12 mm with a maximum SUV of 7.70. Lymph nodes track into the upper pelvis along the LEFT common iliac chain and into the RIGHT upper pelvis as well. The show similar increased metabolic activity. (Image 151/4) maximum SUV 9.42 of nodal tissue along the LEFT common iliac chain measuring 1.4 cm. No discrete solid organ metastasis. Muscular metastasis in the quadratus lumborum on image 121/4 less than a cm size with a maximum SUV of 4.78. Incidental CT findings: Paucity of retroperitoneal and intra-abdominal fat limits assessment. No acute findings in the abdomen or pelvis. SKELETON: Destructive changes about the RIGHT scapula with lucent area in the RIGHT scapular glenoid measuring 2.5 x 1.6 cm showing a maximum SUV of 13.72. Bony involvement is more extensive than the lytic change which is present in the glenoid based on FDG uptake which involves the entire lateral scapula. There is also lytic change in the RIGHT humeral head which does not display increased metabolic activity. 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 Cutaneous lesion (image 95/4) maximum SUV 8.72 area measuring approximally 2.6 cm greatest axial dimension. LEFT quadratus lumborum involvement. (Image 191/4)  destructive changes along the anterior RIGHT femoral neck with a maximum SUV of 6.5. Lytic change in this location measuring 11 point 0 mm. Incidental CT findings: None. 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 Widespread nodal disease in the chest and abdomen. Signs of bony involvement of the RIGHT scapula and RIGHT proximal femur with muscular involvement and cutaneous involvement also as discussed above. Spiculated nodule in the RIGHT chest without increased metabolic activity could  represent an indolent synchronous primary, attention on follow-up. Lytic appearing changes in RIGHT proximal humerus without increased metabolic activity. This could represent patchy demineralization from disuse osteopenia. Suggest attention on follow-up. Pulmonary emphysema. Coronary artery calcification. Electronically Signed   By: Zetta Bills M.D.   On: 02/27/2022 14:58    Microbiology: No results found for this or any previous visit (from the past 240 hour(s)).   Labs: Basic Metabolic Panel: Recent Labs  Lab 03/22/22 0109 03/23/22 0040 03/25/22 0114 03/27/22 0044 03/28/22 0117  NA 136 135 135 134* 135  K 4.5 4.1 3.8 3.9 3.9  CL 96* 97* 94* 93* 94*  CO2 27 29 30 31 31   GLUCOSE 96 120* 99 98 91  BUN 13 13 17 15 14   CREATININE 0.82 0.94 0.91 0.80 0.74  CALCIUM 8.3* 8.4* 8.4* 8.1* 8.3*  MG  --  1.9  --   --   --    Liver Function Tests: Recent Labs  Lab 03/22/22 0109 03/23/22 0040 03/25/22 0114 03/27/22 0044 03/28/22 0117  AST 31 29 24 26 26   ALT 18 16 14 15 13   ALKPHOS 96 97 94 93 92  BILITOT 0.4 0.5 0.5 0.5 0.4  PROT 6.0* 6.5 6.7 6.3* 6.2*  ALBUMIN 1.5* 1.5* 2.0* 1.7* 1.6*   No results for input(s): "LIPASE", "AMYLASE" in the last 168 hours. No results for input(s): "AMMONIA" in the last 168 hours. CBC: Recent Labs  Lab 03/22/22 0109 03/25/22 0114 03/27/22 0044 03/28/22 0117  WBC 12.7* 13.0* 13.9* 13.8*  HGB 9.9* 10.2* 9.2* 9.5*  HCT 32.8* 33.5* 30.4* 30.3*  MCV 78.5* 78.5* 77.2* 76.9*  PLT 660* 766* 817* 790*   Cardiac Enzymes: No results for input(s): "CKTOTAL", "CKMB", "CKMBINDEX", "TROPONINI" in the last 168 hours. BNP: BNP (last 3 results) Recent Labs    03/15/22 1437  BNP 38.4    ProBNP (last 3 results) No results for input(s): "PROBNP" in the last 8760 hours.  CBG: No results for input(s): "GLUCAP" in the last 168 hours.     Signed:  Nita Sells MD   [facility] 03/28/2022, 9:10 AM

## 2022-03-28 NOTE — TOC Transition Note (Signed)
Transition of Care [facility]) - CM/SW Discharge Note   Patient Details  Name: Steven Johnston MRN: [id number] Date of Birth: Jul 22, 1960  [facility] CM/SW Contact:  Bethann Berkshire, [place] Phone Number: 03/28/2022, 1:39 PM   Clinical Narrative:     Patient will DC to: [facility] Anticipated DC date: 03/28/22 Family notified: Pt's father Transport by: Corey Harold   Per MD patient ready for DC to [facility]. RN, patient, patient's family, and facility notified of DC. Discharge Summary and FL2 sent to facility. RN to call report prior to discharge [phone]). DC packet on chart. Ambulance transport requested for patient.   CSW will sign off for now as social work intervention is no longer needed. Please consult [place] again if new needs arise.   Final next level of care: [place] Barriers to Discharge: No Barriers Identified   Patient Goals and CMS Choice        Discharge Placement              Patient chooses bed at:  [facility] [place]) Patient to be transferred to facility by: [facility]   Patient and family notified of of transfer: 03/28/22  Discharge Plan and Services                                     Social Determinants of Health (SDOH) Interventions     Readmission Risk Interventions     No data to display

## 2022-03-28 NOTE — Progress Notes (Signed)
   03/28/22 1100  Mobility  Activity Refused mobility   Pt refused mobility d/t feeling sleepy. Will f/u as able.

## 2022-03-29 NOTE — Progress Notes (Signed)
[name] and discharged to [facility]. Pt was able to ambulate short distance to stretcher. 5L of O2 applied by [facility]. Belonging packed away anf place on stretcher. Left AC IV intact. Call made to [facility] at hospice to let her know pt on the way.

## 2022-04-02 ENCOUNTER — Inpatient Hospital Stay: Payer: Commercial Managed Care - HMO

## 2022-04-02 ENCOUNTER — Inpatient Hospital Stay: Payer: Commercial Managed Care - HMO | Admitting: Physician Assistant

## 2022-04-02 ENCOUNTER — Inpatient Hospital Stay: Payer: Commercial Managed Care - HMO | Admitting: Nurse Practitioner

## 2022-04-03 ENCOUNTER — Ambulatory Visit: Payer: Commercial Managed Care - HMO | Admitting: Infectious Diseases

## 2022-04-03 ENCOUNTER — Other Ambulatory Visit: Payer: Commercial Managed Care - HMO

## 2022-04-14 ENCOUNTER — Telehealth: Payer: Self-pay

## 2022-04-15 ENCOUNTER — Encounter: Payer: Self-pay | Admitting: Internal Medicine

## 2022-04-15 NOTE — Telephone Encounter (Signed)
This nurse received a call on 04/11/22 stating that this patient passed away on 2022-04-26 at 7:58 am.  Information has been forward to HIM.  No further concerns noted at this time.

## 2022-04-23 ENCOUNTER — Encounter: Payer: Commercial Managed Care - HMO | Admitting: Nurse Practitioner

## 2022-04-23 ENCOUNTER — Other Ambulatory Visit: Payer: Commercial Managed Care - HMO

## 2022-04-23 ENCOUNTER — Ambulatory Visit: Payer: Commercial Managed Care - HMO

## 2022-04-27 DIAGNOSIS — C3482 Malignant neoplasm of overlapping sites of left bronchus and lung: Secondary | ICD-10-CM

## 2022-05-02 DEATH — deceased

## 2022-05-12 ENCOUNTER — Other Ambulatory Visit: Payer: Commercial Managed Care - HMO

## 2022-05-12 ENCOUNTER — Encounter: Payer: Commercial Managed Care - HMO | Admitting: Genetic Counselor

## 2022-05-13 ENCOUNTER — Ambulatory Visit: Payer: Commercial Managed Care - HMO | Admitting: Cardiology

## 2022-05-15 ENCOUNTER — Other Ambulatory Visit: Payer: Commercial Managed Care - HMO

## 2022-05-15 ENCOUNTER — Ambulatory Visit: Payer: Commercial Managed Care - HMO

## 2022-05-15 ENCOUNTER — Ambulatory Visit: Payer: Commercial Managed Care - HMO | Admitting: Physician Assistant

## 2022-05-20 ENCOUNTER — Ambulatory Visit: Payer: Self-pay | Admitting: Infectious Diseases

## 2022-12-16 IMAGING — US US ABDOMEN COMPLETE W/ ELASTOGRAPHY
2 series · 12 of 25 positions shown · non-contrast
Comparison: 3 mm, normal

CLINICAL DATA: Screening for hepatocellular carcinoma, history HIV

EXAM:
ULTRASOUND ABDOMEN
ULTRASOUND HEPATIC ELASTOGRAPHY
TECHNIQUE: Sonography of the upper abdomen was performed. In addition,
ultrasound elastography evaluation of the liver was performed. A
region of interest was placed within the right lobe of the liver.
Following application of a compressive sonographic pulse, tissue
compressibility was assessed. Multiple assessments were performed at
the selected site. Median tissue compressibility was determined.
Previously, hepatic stiffness was assessed by shear wave velocity.
Based on recently published Society of Radiologists in Ultrasound
consensus article, reporting is now recommended to be performed in
the SI units of pressure (kiloPascals) representing hepatic
stiffness/elasticity. The obtained result is compared to the
published reference standards. (cACLD = compensated Advanced Chronic
Liver Disease)

[Series 1: us abdomen complete w/elastography · 9 of 86 slices shown]
[im 6/86]
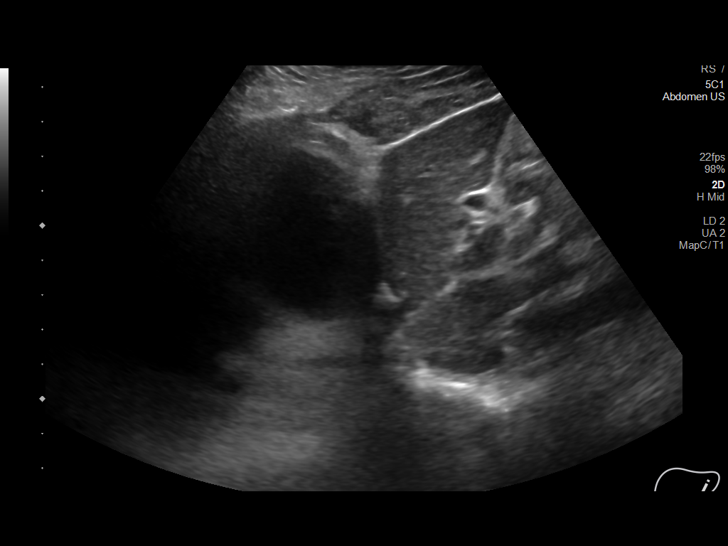
[im 16/86]
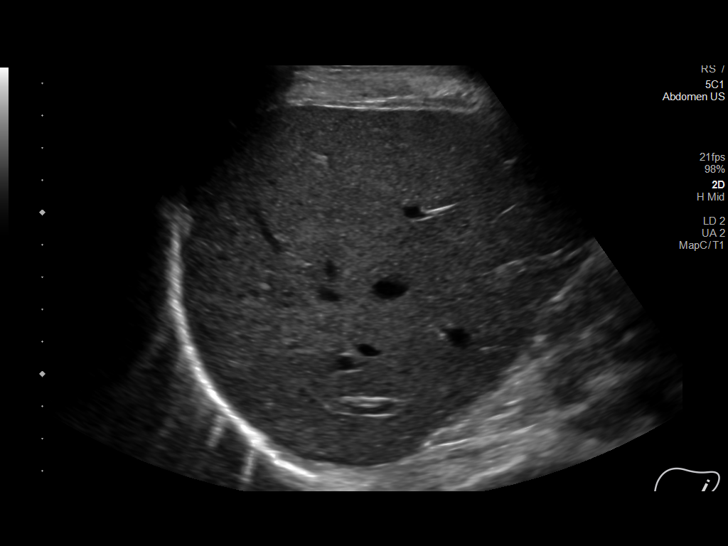
[im 26/86]
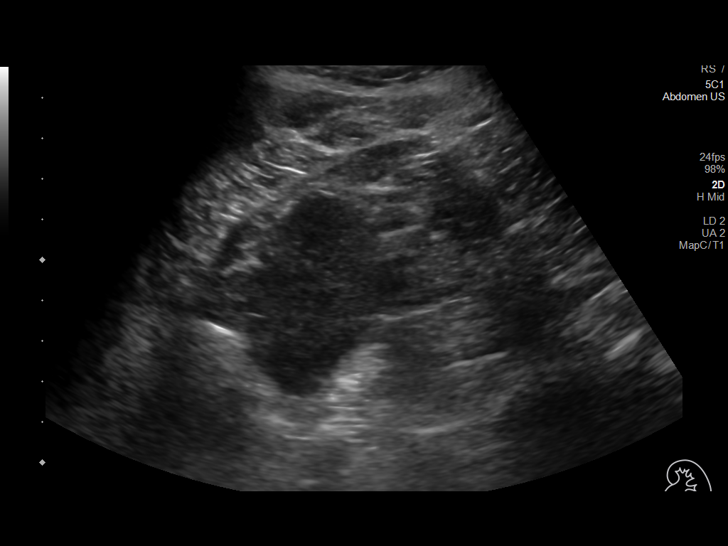
[im 36/86]
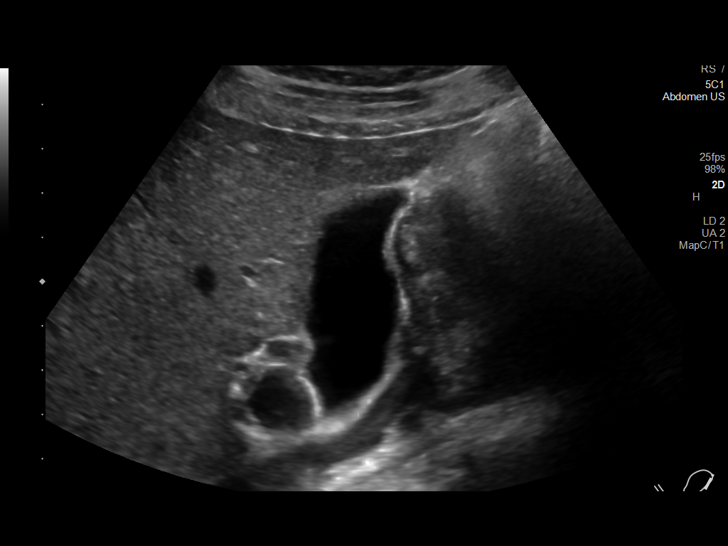
[im 46/86]
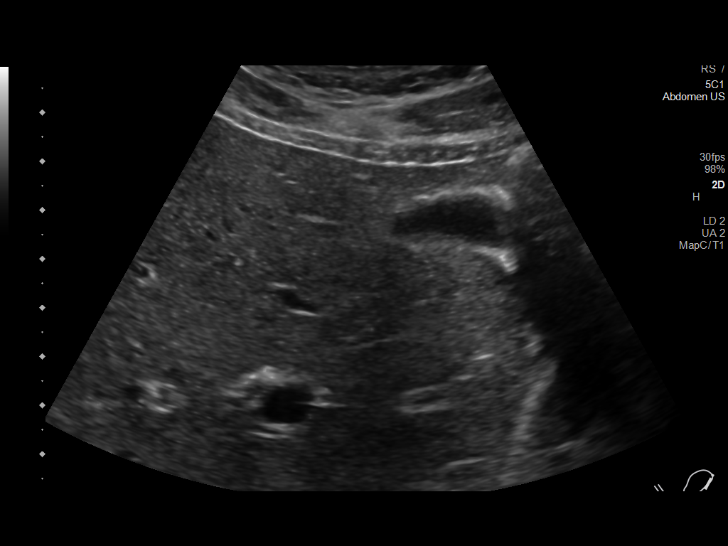
[im 56/86]
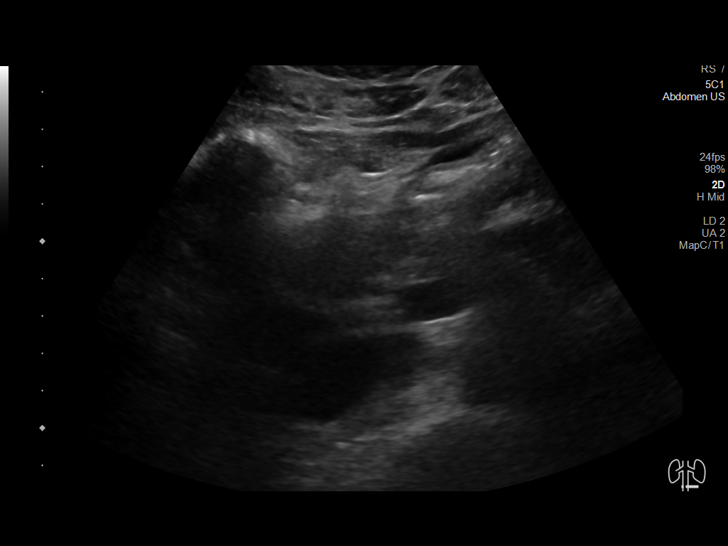
[im 66/86]
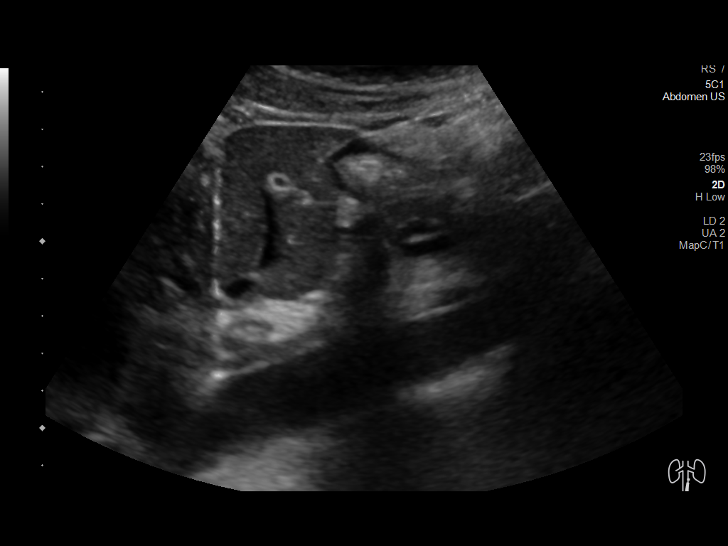
[im 76/86]
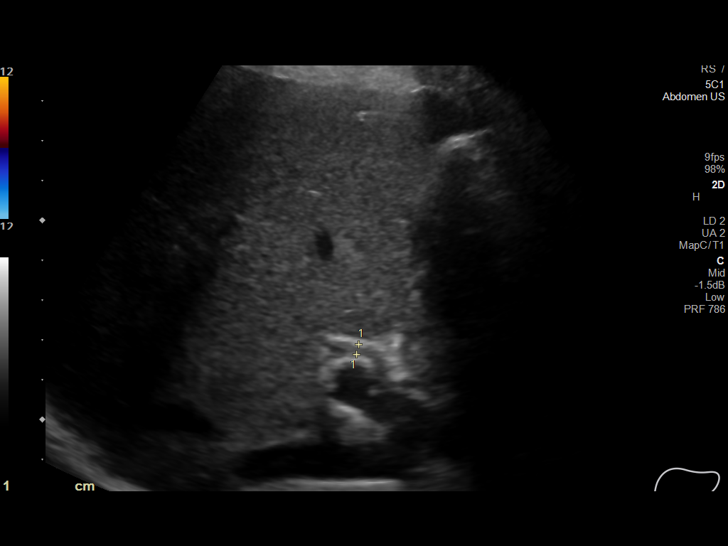
[im 86/86]
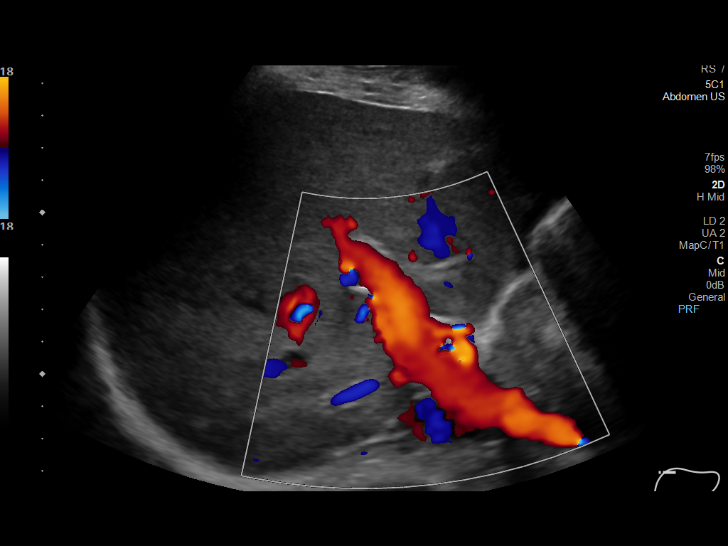

[Series 1001: abdomen us · 3 of 34 slices shown]
[im 6/34]
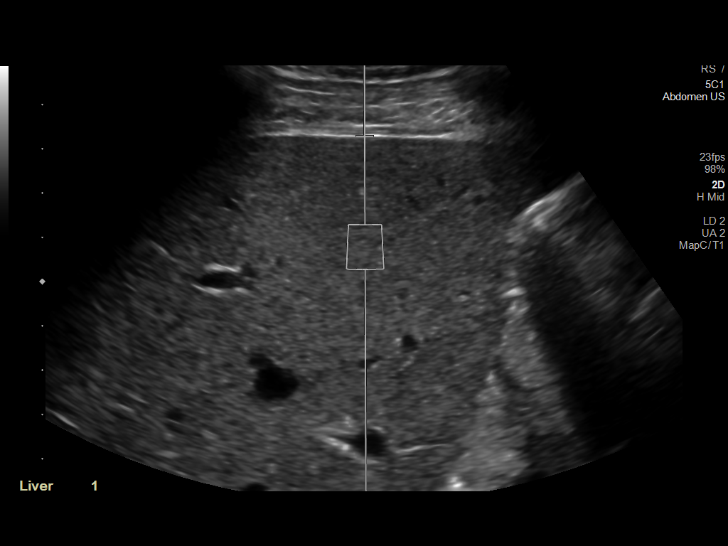
[im 17/34]
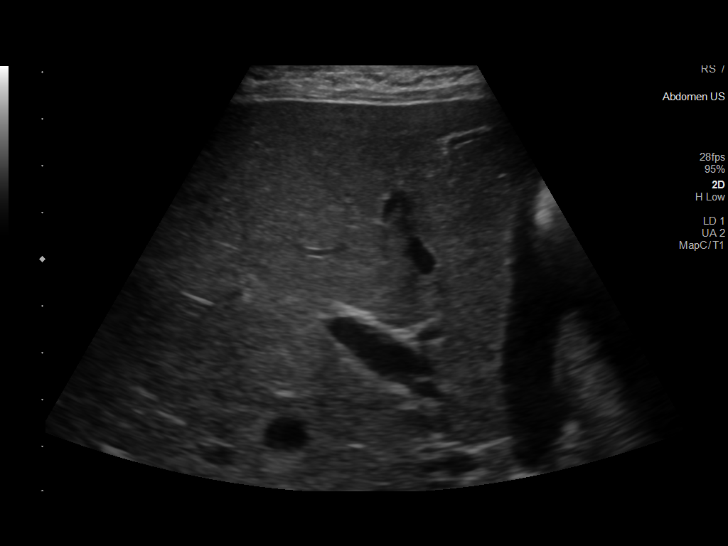
[im 28/34]
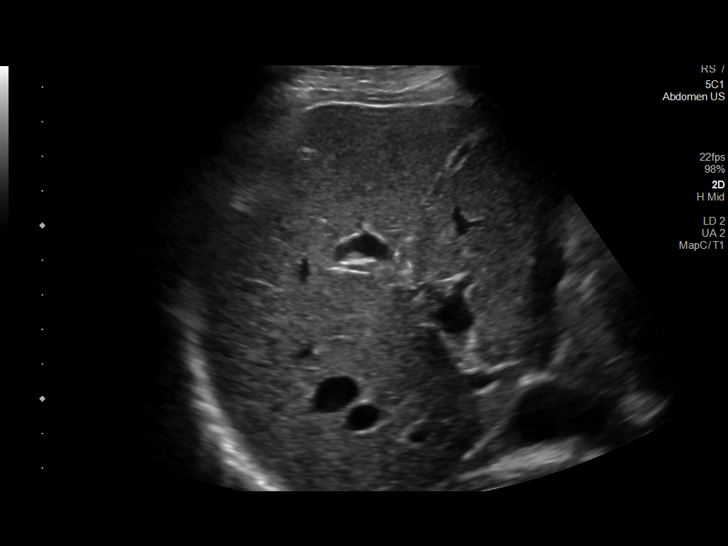

[12 of 25 positions shown; findings below may reference images not displayed]

FINDINGS: ULTRASOUND ABDOMEN

Gallbladder: Normally distended without stones or wall thickening.
No pericholecystic fluid or sonographic Murphy sign.

Common bile duct: Diameter: 3 mm, normal

Liver: Mildly heterogeneous echogenicity. No definite hepatic mass
or nodularity. Portal vein is patent on color Doppler imaging with
normal direction of blood flow towards the liver.

IVC: Normal appearance

Pancreas: Visualized portion of body and proximal tail normal
appearance, remainder obscured by bowel gas

Spleen: Normal appearance, 8.1 cm length

Right Kidney: Length: 10.0 cm. Normal morphology without mass or
hydronephrosis.

Left Kidney: Length: 10.2 cm. Normal morphology without mass or
hydronephrosis.

Abdominal aorta: Proximally normal caliber, remainder obscured by
bowel gas

Other findings: No free fluid

ULTRASOUND HEPATIC ELASTOGRAPHY

Device: Siemens Helix VTQ

Patient position: Oblique

Transducer 5C1

Number of measurements: 10

Hepatic segment:  8

Median kPa:

IQR:

IQR/Median kPa ratio:

Data quality:  Good

Diagnostic category:

The use of hepatic elastography is applicable to patients with viral
hepatitis and non-alcoholic fatty liver disease. At this time, there
is insufficient data for the referenced cut-off values and use in
other causes of liver disease, including alcoholic liver disease.
Patients, however, may be assessed by elastography and serve as
their own reference standard/baseline.

In patients with non-alcoholic liver disease, the values suggesting
compensated advanced chronic liver disease (cACLD) may be lower, and
patients may need additional testing with elasticity results of [DATE]
kPa.

Please note that abnormal hepatic elasticity and shear wave
velocities may also be identified in clinical settings other than
with hepatic fibrosis, such as: acute hepatitis, elevated right
heart and central venous pressures including use of beta blockers,
Nurnabi disease (Gushani), infiltrative processes such as
mastocytosis/amyloidosis/infiltrative tumor/lymphoma, extrahepatic
cholestasis, with hyperemia in the post-prandial state, and with
liver transplantation. Correlation with patient history, laboratory
data, and clinical condition recommended.

Diagnostic Categories:

< or =5 kPa: high probability of being normal

< or =9 kPa: in the absence of other known clinical signs, rules [DATE] kPa and ?13 kPa: suggestive of cACLD, but needs further testing

>13 kPa: highly suggestive of cACLD

> or =17 kPa: highly suggestive of cACLD with an increased
probability of clinically significant portal hypertension
IMPRESSION: ULTRASOUND ABDOMEN:

Incomplete pancreatic and aortic visualization.

No upper abdominal sonographic abnormalities.

ULTRASOUND HEPATIC ELASTOGRAPHY:

Median kPa:

Diagnostic category:  < or = 5 kPa: high probability of being normal
